# Patient Record
Sex: Female | Born: 1935 | Race: White | Hispanic: No | Smoking: Former smoker
Health system: Southern US, Community
[De-identification: ages and names within clinical notes are randomized; demographics above are authoritative.]

## PROBLEM LIST (undated history)

## (undated) DIAGNOSIS — I10 Essential (primary) hypertension: Secondary | ICD-10-CM

## (undated) DIAGNOSIS — E7800 Pure hypercholesterolemia, unspecified: Secondary | ICD-10-CM

## (undated) DIAGNOSIS — F0390 Unspecified dementia without behavioral disturbance: Secondary | ICD-10-CM

## (undated) DIAGNOSIS — I639 Cerebral infarction, unspecified: Secondary | ICD-10-CM

## (undated) DIAGNOSIS — J449 Chronic obstructive pulmonary disease, unspecified: Secondary | ICD-10-CM

## (undated) DIAGNOSIS — R32 Unspecified urinary incontinence: Secondary | ICD-10-CM

## (undated) HISTORY — PX: NO PAST SURGERIES: SHX2092

---

## 2006-09-19 ENCOUNTER — Emergency Department (HOSPITAL_COMMUNITY): Admission: EM | Admit: 2006-09-19 | Discharge: 2006-09-19 | Payer: Self-pay | Admitting: Emergency Medicine

## 2006-10-06 ENCOUNTER — Ambulatory Visit (HOSPITAL_COMMUNITY): Admission: RE | Admit: 2006-10-06 | Discharge: 2006-10-06 | Payer: Self-pay | Admitting: Pulmonary Disease

## 2008-10-14 ENCOUNTER — Ambulatory Visit (HOSPITAL_COMMUNITY): Admission: RE | Admit: 2008-10-14 | Discharge: 2008-10-14 | Payer: Self-pay | Admitting: Pulmonary Disease

## 2008-10-24 ENCOUNTER — Ambulatory Visit (HOSPITAL_COMMUNITY): Admission: RE | Admit: 2008-10-24 | Discharge: 2008-10-24 | Payer: Self-pay | Admitting: Pulmonary Disease

## 2009-12-07 ENCOUNTER — Ambulatory Visit (HOSPITAL_COMMUNITY): Admission: RE | Admit: 2009-12-07 | Discharge: 2009-12-07 | Payer: Self-pay | Admitting: Pulmonary Disease

## 2011-01-11 NOTE — Procedures (Signed)
NAME:  Brittany Powers, Brittany Powers              ACCOUNT NO.:  [id number]   MEDICAL RECORD NO.:  [id number]          PATIENT TYPE:  OUT   LOCATION:  RAD                           FACILITY:  [facility]   PHYSICIAN:  Dani Gobble, MD       DATE OF BIRTH:  November 28, 1930   DATE OF PROCEDURE:  10/06/2006  DATE OF DISCHARGE:                                ECHOCARDIOGRAM   REFERRING PHYSICIAN:  Edward L. Juanetta Gosling, M.D.   Technical quality of the study is somewhat limited secondary to patient  body habitus.   This is a 77 year old female with past medical history of hypertension  with TIAs who is referred for evaluation of LV function.   The aorta measures normally at 2.9 cm.   The left atrium also measures normally at 3.3 cm.  The patient appeared  to be in sinus rhythm during the procedure.   The interventricular septum and posterior wall are mildly thickened,  measured at 1.5 cm and 1.4 cm, respectively.  Additionally she has mild  basal septal hypertrophy overlay as well.  No suggestion of LVOT  obstruction on color-flow Doppler.   The aortic valve is trileaflet and mildly thickened and calcified,  particularly on the noncoronary cusp.  There does not appear to be  significant limitation to leaflet excursion.  No aortic insufficiency is  noted.  Doppler interrogation of the aortic valve reveals a peak  velocity of 1.5 meters per second corresponding to a peak gradient of 9  mmHg and a mean gradient of 6 mmHg.   The mitral valve also appears mildly thickened but without limitation to  leaflet excursion.  Mild mitral annular calcification is noted.  Mild  mitral regurgitation is noted.  Doppler interrogation of mitral valve is  within normal limits.   The pulmonic valve was not well visualized.   Tricuspid valve appears grossly structurally normal with trivial  tricuspid regurgitation noted.   The left ventricle is normal in size with LVIDD measured at 3.6 cm, LVIC  measured 2.5 cm.  Overall left  systolic function is normal and no  regional wall motion abnormalities are noted.  The inflow signal does  suggest diastolic dysfunction.   The right atrium and right ventricle are normal in size and right  ventricular systolic function appears to be normal.  There does appear  to be mild right ventricular hypertrophy.   IMPRESSION:  1. Mild concentric left ventricular hypertrophy with additional basal      septal hypertrophy overlay with out left ventricular outflow tract      obstruction.  2. Mild aortic sclerosis without hemodynamically significant stenosis.  3. Mild mitral and trivial tricuspid regurgitation.  4. Mild mitral annular calcification.  5. Normal left ventricular size and systolic function without regional      wall motion abnormality noted.  6. Presence of diastolic dysfunction is inferred from the inflow      signal.  7. Mild right ventricular hypertrophy  8. No obvious embolic source.  However, the possibility cannot be      excluded on this study.  Consider transesophageal echocardiogram if  clinically indicated.           ______________________________  Dani Gobble, MD     AB/MEDQ  D:  10/06/2006  T:  10/07/2006  Job:  [id number]

## 2012-05-11 ENCOUNTER — Ambulatory Visit (HOSPITAL_COMMUNITY)
Admission: RE | Admit: 2012-05-11 | Discharge: 2012-05-11 | Disposition: A | Discharge status: Home / Self Care | Payer: Medicare Other | Source: Ambulatory Visit | Attending: Pulmonary Disease | Admitting: Pulmonary Disease

## 2012-05-11 ENCOUNTER — Other Ambulatory Visit (HOSPITAL_COMMUNITY): Payer: Self-pay | Admitting: Pulmonary Disease

## 2012-05-11 DIAGNOSIS — R634 Abnormal weight loss: Secondary | ICD-10-CM

## 2012-05-11 DIAGNOSIS — J45909 Unspecified asthma, uncomplicated: Secondary | ICD-10-CM | POA: Insufficient documentation

## 2012-10-06 ENCOUNTER — Emergency Department (HOSPITAL_COMMUNITY)
Admission: EM | Admit: 2012-10-06 | Discharge: 2012-10-06 | Disposition: A | Discharge status: Home / Self Care | Payer: Medicare Other | Attending: Emergency Medicine | Admitting: Emergency Medicine

## 2012-10-06 ENCOUNTER — Encounter (HOSPITAL_COMMUNITY): Payer: Self-pay | Admitting: *Deleted

## 2012-10-06 ENCOUNTER — Emergency Department (HOSPITAL_COMMUNITY): Payer: Medicare Other

## 2012-10-06 DIAGNOSIS — S40029A Contusion of unspecified upper arm, initial encounter: Secondary | ICD-10-CM | POA: Insufficient documentation

## 2012-10-06 DIAGNOSIS — S0010XA Contusion of unspecified eyelid and periocular area, initial encounter: Secondary | ICD-10-CM | POA: Insufficient documentation

## 2012-10-06 DIAGNOSIS — S0180XA Unspecified open wound of other part of head, initial encounter: Secondary | ICD-10-CM | POA: Insufficient documentation

## 2012-10-06 DIAGNOSIS — Y929 Unspecified place or not applicable: Secondary | ICD-10-CM | POA: Insufficient documentation

## 2012-10-06 DIAGNOSIS — Y939 Activity, unspecified: Secondary | ICD-10-CM | POA: Insufficient documentation

## 2012-10-06 DIAGNOSIS — Z7982 Long term (current) use of aspirin: Secondary | ICD-10-CM | POA: Insufficient documentation

## 2012-10-06 DIAGNOSIS — I10 Essential (primary) hypertension: Secondary | ICD-10-CM | POA: Insufficient documentation

## 2012-10-06 DIAGNOSIS — Z8673 Personal history of transient ischemic attack (TIA), and cerebral infarction without residual deficits: Secondary | ICD-10-CM | POA: Insufficient documentation

## 2012-10-06 DIAGNOSIS — W19XXXA Unspecified fall, initial encounter: Secondary | ICD-10-CM | POA: Insufficient documentation

## 2012-10-06 DIAGNOSIS — F17200 Nicotine dependence, unspecified, uncomplicated: Secondary | ICD-10-CM | POA: Insufficient documentation

## 2012-10-06 DIAGNOSIS — S0181XA Laceration without foreign body of other part of head, initial encounter: Secondary | ICD-10-CM

## 2012-10-06 DIAGNOSIS — E7800 Pure hypercholesterolemia, unspecified: Secondary | ICD-10-CM | POA: Insufficient documentation

## 2012-10-06 DIAGNOSIS — Z7902 Long term (current) use of antithrombotics/antiplatelets: Secondary | ICD-10-CM | POA: Insufficient documentation

## 2012-10-06 DIAGNOSIS — S0512XA Contusion of eyeball and orbital tissues, left eye, initial encounter: Secondary | ICD-10-CM

## 2012-10-06 HISTORY — DX: Cerebral infarction, unspecified: I63.9

## 2012-10-06 HISTORY — DX: Pure hypercholesterolemia, unspecified: E78.00

## 2012-10-06 HISTORY — DX: Essential (primary) hypertension: I10

## 2012-10-06 NOTE — ED Notes (Signed)
Physician in room with pt. 

## 2012-10-06 NOTE — ED Notes (Signed)
Pt brought to er by family with c/o fall that occurred this past Sunday, states that she has a hard time walking due to 3 previous strokes. Pt states that she was talking to Dr. Juanetta Gosling today who advised pt to come to er to have xrays done. Speech normal per pt's brother, pt c/o pain to left elbow, right hand, left eye area. Pt states that she fell outside and hit the pavement with the left side of her face. Denies any loc.

## 2012-10-06 NOTE — ED Provider Notes (Signed)
History     CSN: [id number]  Arrival date & time 10/06/12  1545   First MD Initiated Contact with Patient 10/06/12 1601      Chief Complaint  Patient presents with  . Fall    (Consider location/radiation/quality/duration/timing/severity/associated sxs/prior treatment) Patient is a 79 y.o. female presenting with fall.  Fall Pertinent negatives include no numbness, no abdominal pain, no nausea, no vomiting and no headaches.   patient had a fall a couple days ago. She was reaching for a cat and fell. No loss of consciousness she's had swelling in her face since. She saw her primary care Dr. was told to come in to the ER. She's not unsteady. She's had her baseline mental status. No confusion. No slurred speech. She also had some bruising to her arms but no pain. She is not on anticoagulation per the patient.  Past Medical History  Diagnosis Date  . CVA (cerebral infarction)   . Hypertension   . High cholesterol     History reviewed. No pertinent past surgical history.  No family history on file.  History  Substance Use Topics  . Smoking status: Current Every Day Smoker  . Smokeless tobacco: Not on file  . Alcohol Use: No    OB History   Grav Para Term Preterm Abortions TAB SAB Ect Mult Living                  Review of Systems  Constitutional: Negative for activity change and appetite change.  HENT: Negative for neck stiffness.   Eyes: Negative for pain.  Respiratory: Negative for chest tightness and shortness of breath.   Cardiovascular: Negative for chest pain and leg swelling.  Gastrointestinal: Negative for nausea, vomiting, abdominal pain and diarrhea.  Genitourinary: Negative for flank pain.  Musculoskeletal: Negative for back pain.  Skin: Negative for rash.  Neurological: Negative for weakness, numbness and headaches.  Psychiatric/Behavioral: Negative for behavioral problems.    Allergies  Bee venom  Home Medications   Current Outpatient Rx  Name   Route  Sig  Dispense  Refill  . aspirin EC 81 MG tablet   Oral   Take 81 mg by mouth every evening.         . Calcium Carbonate-Vitamin D (CALCIUM 500 + D PO)   Oral   Take 1 tablet by mouth 2 (two) times daily.         . clopidogrel (PLAVIX) 75 MG tablet   Oral   Take 75 mg by mouth every morning.         . fish oil-omega-3 fatty acids 1000 MG capsule   Oral   Take 1 g by mouth 2 (two) times daily.         . hydrochlorothiazide (HYDRODIURIL) 25 MG tablet   Oral   Take 12.5 mg by mouth every morning.         . menthol-thymol (ABSORBINE JR) LIQD   Topical   Apply 1 application topically daily as needed (*Applied to right wrist as needed for pain).         . NIACIN PO   Oral   Take 1,000 mg by mouth every evening.         . verapamil (CALAN) 80 MG tablet   Oral   Take 80 mg by mouth 3 (three) times daily. Takes last dose two hours prior to evening meal           BP 138/70  Pulse 71  Temp(Src) 97.9  F (36.6 C) (Oral)  Resp 18  Ht 5' (1.524 m)  Wt 112 lb (50.803 kg)  BMI 21.87 kg/m2  SpO2 98%  Physical Exam  Constitutional: She is oriented to person, place, and time. She appears well-developed and well-nourished.  HENT:  Head: Normocephalic.  Ecchymosis to left periorbital area. There is swelling. Minimal drainage medially. Extraocular movements intact. No hyphema. Neck is nontender. Range of motion intact. Face is stable.  Eyes: Conjunctivae are normal. Pupils are equal, round, and reactive to light.  Neck: Neck supple.  Cardiovascular: Normal rate and regular rhythm.   Pulmonary/Chest: Effort normal.  Abdominal: Soft.  Musculoskeletal: Normal range of motion.  Elbow area. Range of motion intact. Nontender. Neuro intact distally.  Neurological: She is alert and oriented to person, place, and time.  Skin: Skin is warm.   healing 1.5 cm laceration above left eye.  ED Course  Procedures (including critical care time)  Labs Reviewed - No data  to display Ct Head Wo Contrast  10/06/2012  *RADIOLOGY REPORT*  Clinical Data:  Fall.  Left periorbital soft tissue swelling. History of CVA and hypertension.  CT HEAD WITHOUT CONTRAST CT MAXILLOFACIAL WITHOUT CONTRAST CT CERVICAL SPINE WITHOUT CONTRAST  Technique:  Multidetector CT imaging of the head, cervical spine, and maxillofacial structures were performed using the standard protocol without intravenous contrast. Multiplanar CT image reconstructions of the cervical spine and maxillofacial structures were also generated.  Comparison:  10/14/2008  CT HEAD  Findings:  Stable lacunar infarcts in the basal ganglia and right external capsule. Periventricular and corona radiata white matter hypodensities are most compatible with chronic ischemic microvascular white matter disease.  The brain stem, cerebellum, thalami, ventricular system, and basilar cisterns appear unremarkable.  No intracranial hemorrhage, mass lesion, or acute infarction is identified.  Left periorbital soft tissue swelling noted.  Calcified lesion in the left frontal sinus probably a large osteoma and appears similar to priors.  Atherosclerotic calcification of the carotid siphons noted. Suspected small stable calcified meningioma along the right anterior falx, image 48 of series 3.  IMPRESSION:  1.  No acute intracranial findings. 2.  Left periorbital soft tissue swelling. 3.  Suspected small meningioma along the right anterior falx. 4. Periventricular and corona radiata white matter hypodensities are most compatible with chronic ischemic microvascular white matter disease. 5.  Stable small old lacunar infarcts in the basal ganglia and right external capsule. 6.  Stable calcified lesion in the left frontal sinus, probably an osteoma.  CT MAXILLOFACIAL  Findings:  Left frontal ossified lesion is probably an osteoma. Polypoid mucoperiosteal thickening noted inferiorly in the left maxillary sinus.  Remaining paranasal sinuses intact.  Left  periorbital soft tissue swelling noted without underlying fracture.  No acute facial fracture is observed.  No postseptal edema in the left orbit.  The globes appear symmetric.  IMPRESSION:  1.  Left periorbital soft tissue swelling without underlying facial fracture. 2.  Mild chronic left maxillary sinusitis. 3.  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  CT CERVICAL SPINE  Findings:   Degenerative findings at the C1-2 anterior articulation noted with spurring and a small subcortical cyst in the odontoid.  Multilevel degenerative facet arthropathy observed.  There is 1.5 mm degenerative anterior subluxation at C4-5 and 2 mm degenerative anterior subluxation of C5-6.  Loss of disc height noted at C6-7 with posterior osseous ridging.  No prevertebral soft tissue swelling noted.  No fracture or acute subluxation identified.  Mildly heterogeneous thyroid gland noted with small nodules.  IMPRESSION:  1.  Cervical spondylosis and degenerative disc disease, without fracture or acute subluxation observed. 2.  Small thyroid nodules - this is been previously worked up by thyroid ultrasound, revealing multinodular goiter.   Original Report Authenticated By: Gaylyn Rong, M.D.    Ct Cervical Spine Wo Contrast  10/06/2012  *RADIOLOGY REPORT*  Clinical Data:  Fall.  Left periorbital soft tissue swelling. History of CVA and hypertension.  CT HEAD WITHOUT CONTRAST CT MAXILLOFACIAL WITHOUT CONTRAST CT CERVICAL SPINE WITHOUT CONTRAST  Technique:  Multidetector CT imaging of the head, cervical spine, and maxillofacial structures were performed using the standard protocol without intravenous contrast. Multiplanar CT image reconstructions of the cervical spine and maxillofacial structures were also generated.  Comparison:  10/14/2008  CT HEAD  Findings:  Stable lacunar infarcts in the basal ganglia and right external capsule. Periventricular and corona radiata white matter hypodensities are most compatible with chronic  ischemic microvascular white matter disease.  The brain stem, cerebellum, thalami, ventricular system, and basilar cisterns appear unremarkable.  No intracranial hemorrhage, mass lesion, or acute infarction is identified.  Left periorbital soft tissue swelling noted.  Calcified lesion in the left frontal sinus probably a large osteoma and appears similar to priors.  Atherosclerotic calcification of the carotid siphons noted. Suspected small stable calcified meningioma along the right anterior falx, image 48 of series 3.  IMPRESSION:  1.  No acute intracranial findings. 2.  Left periorbital soft tissue swelling. 3.  Suspected small meningioma along the right anterior falx. 4. Periventricular and corona radiata white matter hypodensities are most compatible with chronic ischemic microvascular white matter disease. 5.  Stable small old lacunar infarcts in the basal ganglia and right external capsule. 6.  Stable calcified lesion in the left frontal sinus, probably an osteoma.  CT MAXILLOFACIAL  Findings:  Left frontal ossified lesion is probably an osteoma. Polypoid mucoperiosteal thickening noted inferiorly in the left maxillary sinus.  Remaining paranasal sinuses intact.  Left periorbital soft tissue swelling noted without underlying fracture.  No acute facial fracture is observed.  No postseptal edema in the left orbit.  The globes appear symmetric.  IMPRESSION:  1.  Left periorbital soft tissue swelling without underlying facial fracture. 2.  Mild chronic left maxillary sinusitis. 3.  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  CT CERVICAL SPINE  Findings:   Degenerative findings at the C1-2 anterior articulation noted with spurring and a small subcortical cyst in the odontoid.  Multilevel degenerative facet arthropathy observed.  There is 1.5 mm degenerative anterior subluxation at C4-5 and 2 mm degenerative anterior subluxation of C5-6.  Loss of disc height noted at C6-7 with posterior osseous ridging.  No  prevertebral soft tissue swelling noted.  No fracture or acute subluxation identified.  Mildly heterogeneous thyroid gland noted with small nodules.  IMPRESSION:  1.  Cervical spondylosis and degenerative disc disease, without fracture or acute subluxation observed. 2.  Small thyroid nodules - this is been previously worked up by thyroid ultrasound, revealing multinodular goiter.   Original Report Authenticated By: Gaylyn Rong, M.D.    Ct Maxillofacial Wo Cm  10/06/2012  *RADIOLOGY REPORT*  Clinical Data:  Fall.  Left periorbital soft tissue swelling. History of CVA and hypertension.  CT HEAD WITHOUT CONTRAST CT MAXILLOFACIAL WITHOUT CONTRAST CT CERVICAL SPINE WITHOUT CONTRAST  Technique:  Multidetector CT imaging of the head, cervical spine, and maxillofacial structures were performed using the standard protocol without intravenous contrast. Multiplanar CT image reconstructions of the cervical spine and maxillofacial structures  were also generated.  Comparison:  10/14/2008  CT HEAD  Findings:  Stable lacunar infarcts in the basal ganglia and right external capsule. Periventricular and corona radiata white matter hypodensities are most compatible with chronic ischemic microvascular white matter disease.  The brain stem, cerebellum, thalami, ventricular system, and basilar cisterns appear unremarkable.  No intracranial hemorrhage, mass lesion, or acute infarction is identified.  Left periorbital soft tissue swelling noted.  Calcified lesion in the left frontal sinus probably a large osteoma and appears similar to priors.  Atherosclerotic calcification of the carotid siphons noted. Suspected small stable calcified meningioma along the right anterior falx, image 48 of series 3.  IMPRESSION:  1.  No acute intracranial findings. 2.  Left periorbital soft tissue swelling. 3.  Suspected small meningioma along the right anterior falx. 4. Periventricular and corona radiata white matter hypodensities are most  compatible with chronic ischemic microvascular white matter disease. 5.  Stable small old lacunar infarcts in the basal ganglia and right external capsule. 6.  Stable calcified lesion in the left frontal sinus, probably an osteoma.  CT MAXILLOFACIAL  Findings:  Left frontal ossified lesion is probably an osteoma. Polypoid mucoperiosteal thickening noted inferiorly in the left maxillary sinus.  Remaining paranasal sinuses intact.  Left periorbital soft tissue swelling noted without underlying fracture.  No acute facial fracture is observed.  No postseptal edema in the left orbit.  The globes appear symmetric.  IMPRESSION:  1.  Left periorbital soft tissue swelling without underlying facial fracture. 2.  Mild chronic left maxillary sinusitis. 3.  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  CT CERVICAL SPINE  Findings:   Degenerative findings at the C1-2 anterior articulation noted with spurring and a small subcortical cyst in the odontoid.  Multilevel degenerative facet arthropathy observed.  There is 1.5 mm degenerative anterior subluxation at C4-5 and 2 mm degenerative anterior subluxation of C5-6.  Loss of disc height noted at C6-7 with posterior osseous ridging.  No prevertebral soft tissue swelling noted.  No fracture or acute subluxation identified.  Mildly heterogeneous thyroid gland noted with small nodules.  IMPRESSION:  1.  Cervical spondylosis and degenerative disc disease, without fracture or acute subluxation observed. 2.  Small thyroid nodules - this is been previously worked up by thyroid ultrasound, revealing multinodular goiter.   Original Report Authenticated By: Gaylyn Rong, M.D.      1. Fall   2. Periorbital contusion of left eye   3. Facial laceration       MDM  Patient with a fall. Negative CTs. No visual changes. Patient will be discharged home        Juliet Rude. Rubin Payor, MD 10/06/12 2136

## 2013-04-07 ENCOUNTER — Other Ambulatory Visit (HOSPITAL_COMMUNITY): Payer: Self-pay | Admitting: Pulmonary Disease

## 2013-04-07 DIAGNOSIS — R531 Weakness: Secondary | ICD-10-CM

## 2013-04-12 ENCOUNTER — Ambulatory Visit (HOSPITAL_COMMUNITY)
Admission: RE | Admit: 2013-04-12 | Discharge: 2013-04-12 | Disposition: A | Discharge status: Home / Self Care | Payer: Medicare Other | Source: Ambulatory Visit | Attending: Pulmonary Disease | Admitting: Pulmonary Disease

## 2013-04-12 DIAGNOSIS — E042 Nontoxic multinodular goiter: Secondary | ICD-10-CM | POA: Insufficient documentation

## 2013-04-12 DIAGNOSIS — R531 Weakness: Secondary | ICD-10-CM

## 2013-04-12 DIAGNOSIS — R5381 Other malaise: Secondary | ICD-10-CM | POA: Insufficient documentation

## 2013-04-12 DIAGNOSIS — R946 Abnormal results of thyroid function studies: Secondary | ICD-10-CM | POA: Insufficient documentation

## 2013-04-16 ENCOUNTER — Other Ambulatory Visit (HOSPITAL_COMMUNITY): Payer: Self-pay | Admitting: Pulmonary Disease

## 2013-04-16 DIAGNOSIS — E042 Nontoxic multinodular goiter: Secondary | ICD-10-CM

## 2013-04-22 ENCOUNTER — Ambulatory Visit (HOSPITAL_COMMUNITY)
Admission: RE | Admit: 2013-04-22 | Discharge: 2013-04-22 | Disposition: A | Discharge status: Home / Self Care | Payer: Medicare Other | Source: Ambulatory Visit | Attending: Pulmonary Disease | Admitting: Pulmonary Disease

## 2013-04-22 ENCOUNTER — Encounter (HOSPITAL_COMMUNITY): Payer: Self-pay

## 2013-04-22 VITALS — BP 153/73 | HR 70 | Temp 98.7°F | Resp 16

## 2013-04-22 DIAGNOSIS — E042 Nontoxic multinodular goiter: Secondary | ICD-10-CM

## 2013-04-22 MED ORDER — LIDOCAINE HCL (PF) 2 % IJ SOLN
INTRAMUSCULAR | Status: AC
  Filled 2013-04-22: qty 10

## 2013-04-30 ENCOUNTER — Other Ambulatory Visit (HOSPITAL_COMMUNITY): Payer: Self-pay | Admitting: Pulmonary Disease

## 2013-04-30 ENCOUNTER — Ambulatory Visit (HOSPITAL_COMMUNITY)
Admission: RE | Admit: 2013-04-30 | Discharge: 2013-04-30 | Disposition: A | Discharge status: Home / Self Care | Payer: Medicare Other | Source: Ambulatory Visit | Attending: Pulmonary Disease | Admitting: Pulmonary Disease

## 2013-04-30 DIAGNOSIS — E042 Nontoxic multinodular goiter: Secondary | ICD-10-CM

## 2013-04-30 MED ORDER — LIDOCAINE HCL (PF) 2 % IJ SOLN
INTRAMUSCULAR | Status: AC
  Administered 2013-04-30: 10 mL
  Filled 2013-04-30: qty 10

## 2013-04-30 MED ORDER — LIDOCAINE HCL (PF) 2 % IJ SOLN
10.0000 mL | Freq: Once | INTRAMUSCULAR | Status: AC
  Administered 2013-04-30: 10 mL
  Filled 2013-04-30: qty 10

## 2013-04-30 NOTE — Progress Notes (Signed)
Pt thyroid biopsy complete no signs of distress.

## 2013-07-09 ENCOUNTER — Other Ambulatory Visit (HOSPITAL_COMMUNITY): Payer: Self-pay | Admitting: Pulmonary Disease

## 2013-07-09 DIAGNOSIS — I6782 Cerebral ischemia: Secondary | ICD-10-CM

## 2013-07-13 ENCOUNTER — Other Ambulatory Visit (HOSPITAL_COMMUNITY): Payer: Medicare Other

## 2013-07-20 ENCOUNTER — Ambulatory Visit (HOSPITAL_COMMUNITY)
Admission: RE | Admit: 2013-07-20 | Discharge: 2013-07-20 | Disposition: A | Discharge status: Home / Self Care | Payer: Medicare Other | Source: Ambulatory Visit | Attending: Pulmonary Disease | Admitting: Pulmonary Disease

## 2013-07-20 DIAGNOSIS — I6782 Cerebral ischemia: Secondary | ICD-10-CM

## 2013-07-20 DIAGNOSIS — I658 Occlusion and stenosis of other precerebral arteries: Secondary | ICD-10-CM | POA: Insufficient documentation

## 2013-07-20 DIAGNOSIS — I6529 Occlusion and stenosis of unspecified carotid artery: Secondary | ICD-10-CM | POA: Insufficient documentation

## 2013-07-20 DIAGNOSIS — I6789 Other cerebrovascular disease: Secondary | ICD-10-CM | POA: Insufficient documentation

## 2014-03-15 ENCOUNTER — Other Ambulatory Visit (HOSPITAL_COMMUNITY): Payer: Self-pay | Admitting: Pulmonary Disease

## 2014-03-15 DIAGNOSIS — R7989 Other specified abnormal findings of blood chemistry: Secondary | ICD-10-CM

## 2014-03-17 ENCOUNTER — Ambulatory Visit (HOSPITAL_COMMUNITY)
Admission: RE | Admit: 2014-03-17 | Discharge: 2014-03-17 | Disposition: A | Discharge status: Home / Self Care | Payer: Medicare Other | Source: Ambulatory Visit | Attending: Pulmonary Disease | Admitting: Pulmonary Disease

## 2014-03-17 DIAGNOSIS — E042 Nontoxic multinodular goiter: Secondary | ICD-10-CM | POA: Diagnosis not present

## 2014-03-17 DIAGNOSIS — R7989 Other specified abnormal findings of blood chemistry: Secondary | ICD-10-CM | POA: Insufficient documentation

## 2014-04-02 ENCOUNTER — Emergency Department (HOSPITAL_COMMUNITY)
Admission: EM | Admit: 2014-04-02 | Discharge: 2014-04-02 | Disposition: A | Discharge status: Home / Self Care | Payer: Medicare Other | Attending: Emergency Medicine | Admitting: Emergency Medicine

## 2014-04-02 ENCOUNTER — Encounter (HOSPITAL_COMMUNITY): Payer: Self-pay | Admitting: Emergency Medicine

## 2014-04-02 ENCOUNTER — Emergency Department (HOSPITAL_COMMUNITY): Payer: Medicare Other

## 2014-04-02 DIAGNOSIS — S6990XA Unspecified injury of unspecified wrist, hand and finger(s), initial encounter: Secondary | ICD-10-CM

## 2014-04-02 DIAGNOSIS — R5383 Other fatigue: Secondary | ICD-10-CM

## 2014-04-02 DIAGNOSIS — F17200 Nicotine dependence, unspecified, uncomplicated: Secondary | ICD-10-CM | POA: Insufficient documentation

## 2014-04-02 DIAGNOSIS — S0990XA Unspecified injury of head, initial encounter: Secondary | ICD-10-CM | POA: Diagnosis not present

## 2014-04-02 DIAGNOSIS — Y929 Unspecified place or not applicable: Secondary | ICD-10-CM | POA: Insufficient documentation

## 2014-04-02 DIAGNOSIS — Z862 Personal history of diseases of the blood and blood-forming organs and certain disorders involving the immune mechanism: Secondary | ICD-10-CM | POA: Insufficient documentation

## 2014-04-02 DIAGNOSIS — Y939 Activity, unspecified: Secondary | ICD-10-CM | POA: Insufficient documentation

## 2014-04-02 DIAGNOSIS — I10 Essential (primary) hypertension: Secondary | ICD-10-CM | POA: Diagnosis not present

## 2014-04-02 DIAGNOSIS — R5381 Other malaise: Secondary | ICD-10-CM | POA: Insufficient documentation

## 2014-04-02 DIAGNOSIS — Z8639 Personal history of other endocrine, nutritional and metabolic disease: Secondary | ICD-10-CM | POA: Insufficient documentation

## 2014-04-02 DIAGNOSIS — Z7902 Long term (current) use of antithrombotics/antiplatelets: Secondary | ICD-10-CM | POA: Diagnosis not present

## 2014-04-02 DIAGNOSIS — R296 Repeated falls: Secondary | ICD-10-CM | POA: Diagnosis not present

## 2014-04-02 DIAGNOSIS — R531 Weakness: Secondary | ICD-10-CM

## 2014-04-02 DIAGNOSIS — S59909A Unspecified injury of unspecified elbow, initial encounter: Secondary | ICD-10-CM | POA: Diagnosis present

## 2014-04-02 DIAGNOSIS — Z7982 Long term (current) use of aspirin: Secondary | ICD-10-CM | POA: Insufficient documentation

## 2014-04-02 DIAGNOSIS — S59919A Unspecified injury of unspecified forearm, initial encounter: Secondary | ICD-10-CM

## 2014-04-02 DIAGNOSIS — Z79899 Other long term (current) drug therapy: Secondary | ICD-10-CM | POA: Diagnosis not present

## 2014-04-02 DIAGNOSIS — Z9181 History of falling: Secondary | ICD-10-CM | POA: Insufficient documentation

## 2014-04-02 DIAGNOSIS — S5010XA Contusion of unspecified forearm, initial encounter: Secondary | ICD-10-CM | POA: Diagnosis not present

## 2014-04-02 LAB — COMPREHENSIVE METABOLIC PANEL
ALT: 13 U/L (ref 0–35)
AST: 24 U/L (ref 0–37)
Albumin: 3.4 g/dL — ABNORMAL LOW (ref 3.5–5.2)
Alkaline Phosphatase: 56 U/L (ref 39–117)
Anion gap: 11 (ref 5–15)
BUN: 21 mg/dL (ref 6–23)
CO2: 24 meq/L (ref 19–32)
CREATININE: 0.69 mg/dL (ref 0.50–1.10)
Calcium: 9.1 mg/dL (ref 8.4–10.5)
Chloride: 108 mEq/L (ref 96–112)
GFR calc Af Amer: >90 mL/min (ref >90)
GFR, EST NON AFRICAN AMERICAN: 80 mL/min — AB (ref >90)
Glucose, Bld: 99 mg/dL (ref 70–99)
Potassium: 3 mEq/L — ABNORMAL LOW (ref 3.7–5.3)
Sodium: 143 mEq/L (ref 137–147)
Total Bilirubin: 0.4 mg/dL (ref 0.3–1.2)
Total Protein: 6.2 g/dL (ref 6.0–8.3)

## 2014-04-02 LAB — CBC WITH DIFFERENTIAL/PLATELET
Basophils Absolute: 0 10*3/uL (ref 0.0–0.1)
Basophils Relative: 0 % (ref 0–1)
Eosinophils Absolute: 0 10*3/uL (ref 0.0–0.7)
Eosinophils Relative: 0 % (ref 0–5)
HCT: 40.7 % (ref 36.0–46.0)
Hemoglobin: 14.3 g/dL (ref 12.0–15.0)
Lymphocytes Relative: 16 % (ref 12–46)
Lymphs Abs: 1.7 10*3/uL (ref 0.7–4.0)
MCH: 33 pg (ref 26.0–34.0)
MCHC: 35.1 g/dL (ref 30.0–36.0)
MCV: 94 fL (ref 78.0–100.0)
MONO ABS: 0.6 10*3/uL (ref 0.1–1.0)
MONOS PCT: 5 % (ref 3–12)
NEUTROS ABS: 8.4 10*3/uL — AB (ref 1.7–7.7)
Neutrophils Relative %: 79 % — ABNORMAL HIGH (ref 43–77)
Platelets: 159 10*3/uL (ref 150–400)
RBC: 4.33 MIL/uL (ref 3.87–5.11)
RDW: 12.6 % (ref 11.5–15.5)
WBC: 10.8 10*3/uL — AB (ref 4.0–10.5)

## 2014-04-02 LAB — TROPONIN I

## 2014-04-02 MED ORDER — SODIUM CHLORIDE 0.9 % IV BOLUS (SEPSIS)
500.0000 mL | Freq: Once | INTRAVENOUS | Status: AC
  Administered 2014-04-02: 500 mL via INTRAVENOUS

## 2014-04-02 NOTE — ED Notes (Signed)
Pt arrived by EMS from home. Advised pt has been falling more recently. Pt complaining of head hurting from a fall 2 weeks ago. Pt states has fallen 2x today. Hematoma to the right arm. cbg 127 per EMS.

## 2014-04-02 NOTE — ED Notes (Signed)
Pt alert & oriented x4, stable gait. Patient given discharge instructions, paperwork & prescription(s). Patient/ family verbalized understanding. Pt left department in wheelchair w/ no further questions.

## 2014-04-02 NOTE — ED Provider Notes (Signed)
CSN: [id number]     Arrival date & time 04/02/14  1911 History   First MD Initiated Contact with Patient 04/02/14 1919     Chief Complaint  Patient presents with  . Fall     (Consider location/radiation/quality/duration/timing/severity/associated sxs/prior Treatment) HPI Comments: Patient is an 80 year old female with history of hypertension and CVA x4. Her main deficit appears to be an expressive aphasia. She presents today with complaints of recurrent falls that have been occurring over the past week. She states she hit her head last week and it has been hurting. She fell at home twice a day and complains of a bruise to her right forearm. She has a hard time expressing herself do to her expressive aphasia, however she denies pain. She is unable to explain to me exactly what happens when she falls. She apparently lives alone.  Patient is a 80 y.o. female presenting with fall. The history is provided by the patient.  Fall This is a recurrent problem. Episode onset: One week ago. The problem occurs every several days. The problem has been gradually worsening. Pertinent negatives include no chest pain, no abdominal pain, no headaches and no shortness of breath. Nothing aggravates the symptoms. Nothing relieves the symptoms. She has tried nothing for the symptoms. The treatment provided no relief.    Past Medical History  Diagnosis Date  . CVA (cerebral infarction)   . Hypertension   . High cholesterol    History reviewed. No pertinent past surgical history. No family history on file. History  Substance Use Topics  . Smoking status: Current Every Day Smoker  . Smokeless tobacco: Not on file  . Alcohol Use: No   OB History   Grav Para Term Preterm Abortions TAB SAB Ect Mult Living                 Review of Systems  Respiratory: Negative for shortness of breath.   Cardiovascular: Negative for chest pain.  Gastrointestinal: Negative for abdominal pain.  Neurological: Negative for  headaches.  All other systems reviewed and are negative.     Allergies  Bee venom  Home Medications   Prior to Admission medications   Medication Sig Start Date End Date Taking? Authorizing Provider  aspirin EC 81 MG tablet Take 81 mg by mouth every evening.    Historical Provider, MD  Calcium Carbonate-Vitamin D (CALCIUM 500 + D PO) Take 1 tablet by mouth 2 (two) times daily.    Historical Provider, MD  clopidogrel (PLAVIX) 75 MG tablet Take 75 mg by mouth every morning.    Historical Provider, MD  fish oil-omega-3 fatty acids 1000 MG capsule Take 1 g by mouth 2 (two) times daily.    Historical Provider, MD  hydrochlorothiazide (HYDRODIURIL) 25 MG tablet Take 12.5 mg by mouth every morning.    Historical Provider, MD  menthol-thymol (ABSORBINE JR) LIQD Apply 1 application topically daily as needed (*Applied to right wrist as needed for pain).    Historical Provider, MD  NIACIN PO Take 1,000 mg by mouth every evening.    Historical Provider, MD  verapamil (CALAN) 80 MG tablet Take 80 mg by mouth 3 (three) times daily. Takes last dose two hours prior to evening meal    Historical Provider, MD   BP 164/98  Pulse 85  Temp(Src) 98.4 F (36.9 C) (Oral)  Ht 5\' 2"  (1.575 m)  Wt 112 lb (50.803 kg)  BMI 20.48 kg/m2  SpO2 99% Physical Exam  Nursing note and vitals reviewed.  Constitutional: She is oriented to person, place, and time. She appears well-developed and well-nourished. No distress.  HENT:  Head: Normocephalic and atraumatic.  Mouth/Throat: Oropharynx is clear and moist.  Eyes: EOM are normal. Pupils are equal, round, and reactive to light.  Neck: Normal range of motion. Neck supple.  Cardiovascular: Normal rate and regular rhythm.  Exam reveals no gallop and no friction rub.   No murmur heard. Pulmonary/Chest: Effort normal and breath sounds normal. No respiratory distress. She has no wheezes.  Abdominal: Soft. Bowel sounds are normal. She exhibits no distension. There is no  tenderness.  Musculoskeletal: Normal range of motion.  There is a large contusion to the left forearm just below the elbow. There is no other deformity and she is able to flex and extend the elbow with minimal discomfort. Distal pulses are palpable and motor and sensory are intact.  Neurological: She is alert and oriented to person, place, and time. No cranial nerve deficit. She exhibits normal muscle tone. Coordination normal.  She does have an expressive aphasia noted.  Skin: Skin is warm and dry. She is not diaphoretic.    ED Course  Procedures (including critical care time) Labs Review Labs Reviewed  CBC WITH DIFFERENTIAL  COMPREHENSIVE METABOLIC PANEL  TROPONIN I    Imaging Review No results found.   EKG Interpretation   Date/Time:  Saturday April 02 2014 19:16:11 EDT Ventricular Rate:  97 PR Interval:  138 QRS Duration: 93 QT Interval:  405 QTC Calculation: 514 R Axis:   63 Text Interpretation:  Sinus rhythm Ventricular bigeminy Minimal ST  elevation, anterior leads Confirmed by DELOS  MD, Alyce Inscore ([id number]) on  04/02/2014 7:38:21 PM      MDM   Final diagnoses:  None    Patient is an 80 year old female brought for evaluation of frequent falls. She apparently fell twice today, one time bruising her forearm. Her x-rays are unremarkable for fracture and laboratory studies are unremarkable as well. Chest x-ray reveals no acute abnormality as does CT scan.  I have found no explanation for her increased unsteadiness, however nothing appears emergent. I discussed the disposition with the patient and family who is at bedside and we have come to the decision that she will be discharged. They will give her additional assistance for the next few days and return to the emergency department if she develops any new and concerning symptoms.    Geoffery Lyonsouglas Evon Lopezperez, MD 04/03/14 2123

## 2014-04-02 NOTE — ED Notes (Signed)
Pt alert. Per EMS pt speech is normally slurred. Pt leaved alone & has family check on her 2 times a day. All neuro in check

## 2014-04-02 NOTE — Discharge Instructions (Signed)

## 2014-09-26 DIAGNOSIS — J449 Chronic obstructive pulmonary disease, unspecified: Secondary | ICD-10-CM | POA: Diagnosis not present

## 2014-09-26 DIAGNOSIS — G453 Amaurosis fugax: Secondary | ICD-10-CM | POA: Diagnosis not present

## 2014-09-26 DIAGNOSIS — F0390 Unspecified dementia without behavioral disturbance: Secondary | ICD-10-CM | POA: Diagnosis not present

## 2014-09-26 DIAGNOSIS — I10 Essential (primary) hypertension: Secondary | ICD-10-CM | POA: Diagnosis not present

## 2014-09-29 DIAGNOSIS — Z9181 History of falling: Secondary | ICD-10-CM | POA: Diagnosis not present

## 2014-09-29 DIAGNOSIS — F0390 Unspecified dementia without behavioral disturbance: Secondary | ICD-10-CM | POA: Diagnosis not present

## 2014-09-29 DIAGNOSIS — R634 Abnormal weight loss: Secondary | ICD-10-CM | POA: Diagnosis not present

## 2014-09-29 DIAGNOSIS — G459 Transient cerebral ischemic attack, unspecified: Secondary | ICD-10-CM | POA: Diagnosis not present

## 2014-09-29 DIAGNOSIS — I10 Essential (primary) hypertension: Secondary | ICD-10-CM | POA: Diagnosis not present

## 2014-09-29 DIAGNOSIS — J449 Chronic obstructive pulmonary disease, unspecified: Secondary | ICD-10-CM | POA: Diagnosis not present

## 2014-09-29 DIAGNOSIS — Z8673 Personal history of transient ischemic attack (TIA), and cerebral infarction without residual deficits: Secondary | ICD-10-CM | POA: Diagnosis not present

## 2014-09-30 DIAGNOSIS — G459 Transient cerebral ischemic attack, unspecified: Secondary | ICD-10-CM | POA: Diagnosis not present

## 2014-09-30 DIAGNOSIS — I10 Essential (primary) hypertension: Secondary | ICD-10-CM | POA: Diagnosis not present

## 2014-09-30 DIAGNOSIS — Z9181 History of falling: Secondary | ICD-10-CM | POA: Diagnosis not present

## 2014-09-30 DIAGNOSIS — R634 Abnormal weight loss: Secondary | ICD-10-CM | POA: Diagnosis not present

## 2014-09-30 DIAGNOSIS — Z8673 Personal history of transient ischemic attack (TIA), and cerebral infarction without residual deficits: Secondary | ICD-10-CM | POA: Diagnosis not present

## 2014-09-30 DIAGNOSIS — F0390 Unspecified dementia without behavioral disturbance: Secondary | ICD-10-CM | POA: Diagnosis not present

## 2014-09-30 DIAGNOSIS — J449 Chronic obstructive pulmonary disease, unspecified: Secondary | ICD-10-CM | POA: Diagnosis not present

## 2014-10-03 DIAGNOSIS — J449 Chronic obstructive pulmonary disease, unspecified: Secondary | ICD-10-CM | POA: Diagnosis not present

## 2014-10-03 DIAGNOSIS — F0390 Unspecified dementia without behavioral disturbance: Secondary | ICD-10-CM | POA: Diagnosis not present

## 2014-10-03 DIAGNOSIS — Z9181 History of falling: Secondary | ICD-10-CM | POA: Diagnosis not present

## 2014-10-03 DIAGNOSIS — I10 Essential (primary) hypertension: Secondary | ICD-10-CM | POA: Diagnosis not present

## 2014-10-03 DIAGNOSIS — R634 Abnormal weight loss: Secondary | ICD-10-CM | POA: Diagnosis not present

## 2014-10-03 DIAGNOSIS — G459 Transient cerebral ischemic attack, unspecified: Secondary | ICD-10-CM | POA: Diagnosis not present

## 2014-10-03 DIAGNOSIS — Z8673 Personal history of transient ischemic attack (TIA), and cerebral infarction without residual deficits: Secondary | ICD-10-CM | POA: Diagnosis not present

## 2014-10-07 DIAGNOSIS — I10 Essential (primary) hypertension: Secondary | ICD-10-CM | POA: Diagnosis not present

## 2014-10-07 DIAGNOSIS — Z9181 History of falling: Secondary | ICD-10-CM | POA: Diagnosis not present

## 2014-10-07 DIAGNOSIS — F0390 Unspecified dementia without behavioral disturbance: Secondary | ICD-10-CM | POA: Diagnosis not present

## 2014-10-07 DIAGNOSIS — J449 Chronic obstructive pulmonary disease, unspecified: Secondary | ICD-10-CM | POA: Diagnosis not present

## 2014-10-07 DIAGNOSIS — Z8673 Personal history of transient ischemic attack (TIA), and cerebral infarction without residual deficits: Secondary | ICD-10-CM | POA: Diagnosis not present

## 2014-10-07 DIAGNOSIS — G459 Transient cerebral ischemic attack, unspecified: Secondary | ICD-10-CM | POA: Diagnosis not present

## 2014-10-07 DIAGNOSIS — R634 Abnormal weight loss: Secondary | ICD-10-CM | POA: Diagnosis not present

## 2014-10-11 DIAGNOSIS — F0390 Unspecified dementia without behavioral disturbance: Secondary | ICD-10-CM | POA: Diagnosis not present

## 2014-10-11 DIAGNOSIS — Z9181 History of falling: Secondary | ICD-10-CM | POA: Diagnosis not present

## 2014-10-11 DIAGNOSIS — R634 Abnormal weight loss: Secondary | ICD-10-CM | POA: Diagnosis not present

## 2014-10-11 DIAGNOSIS — G459 Transient cerebral ischemic attack, unspecified: Secondary | ICD-10-CM | POA: Diagnosis not present

## 2014-10-11 DIAGNOSIS — I10 Essential (primary) hypertension: Secondary | ICD-10-CM | POA: Diagnosis not present

## 2014-10-11 DIAGNOSIS — Z8673 Personal history of transient ischemic attack (TIA), and cerebral infarction without residual deficits: Secondary | ICD-10-CM | POA: Diagnosis not present

## 2014-10-11 DIAGNOSIS — J449 Chronic obstructive pulmonary disease, unspecified: Secondary | ICD-10-CM | POA: Diagnosis not present

## 2014-10-14 DIAGNOSIS — F0390 Unspecified dementia without behavioral disturbance: Secondary | ICD-10-CM | POA: Diagnosis not present

## 2014-10-14 DIAGNOSIS — Z8673 Personal history of transient ischemic attack (TIA), and cerebral infarction without residual deficits: Secondary | ICD-10-CM | POA: Diagnosis not present

## 2014-10-14 DIAGNOSIS — G459 Transient cerebral ischemic attack, unspecified: Secondary | ICD-10-CM | POA: Diagnosis not present

## 2014-10-14 DIAGNOSIS — J449 Chronic obstructive pulmonary disease, unspecified: Secondary | ICD-10-CM | POA: Diagnosis not present

## 2014-10-14 DIAGNOSIS — I10 Essential (primary) hypertension: Secondary | ICD-10-CM | POA: Diagnosis not present

## 2014-10-14 DIAGNOSIS — R634 Abnormal weight loss: Secondary | ICD-10-CM | POA: Diagnosis not present

## 2014-10-14 DIAGNOSIS — Z9181 History of falling: Secondary | ICD-10-CM | POA: Diagnosis not present

## 2014-12-20 IMAGING — US US SOFT TISSUE HEAD/NECK
1 series · 14 of 25 positions shown · non-contrast
Comparison: CT 10/06/2012 and earlier studies

CLINICAL DATA: Weakness, abnormal TSH.  Small nodules on CT
cervical spine.

THYROID ULTRASOUND
TECHNIQUE: Ultrasound examination of the thyroid gland and adjacent
soft tissues was performed.

[Series 1: us soft tissue head/neck · 0.05mm/px · 14 of 50 slices shown]
[im 1/50]
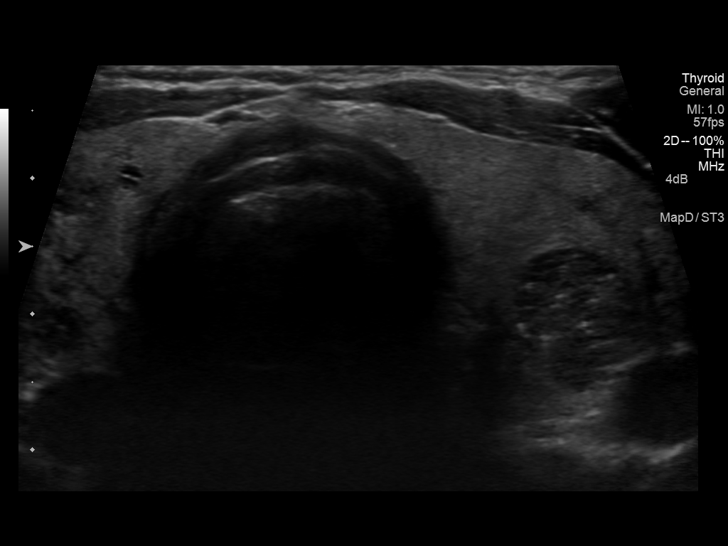
[im 5/50]
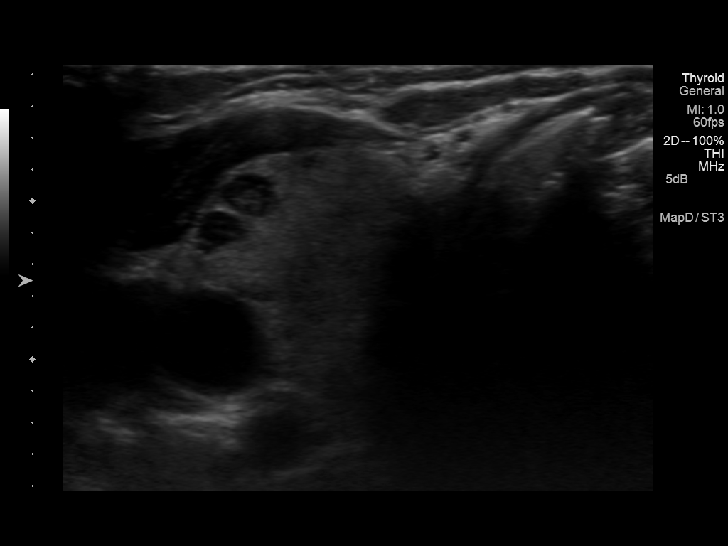
[im 9/50]
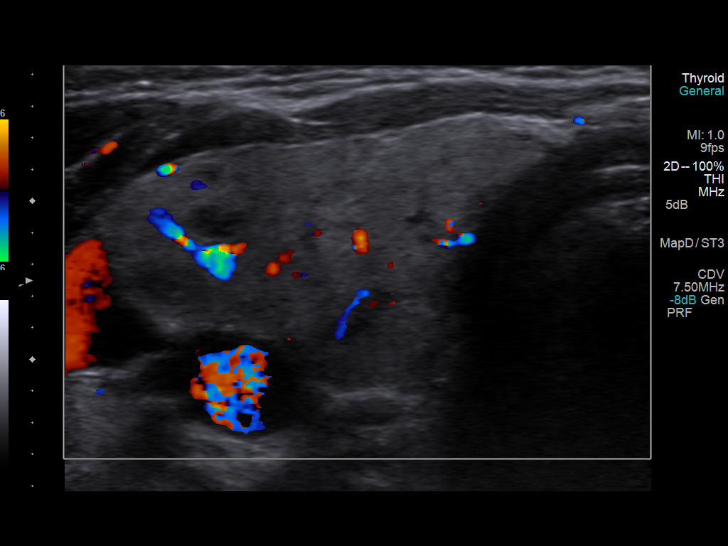
[im 13/50]
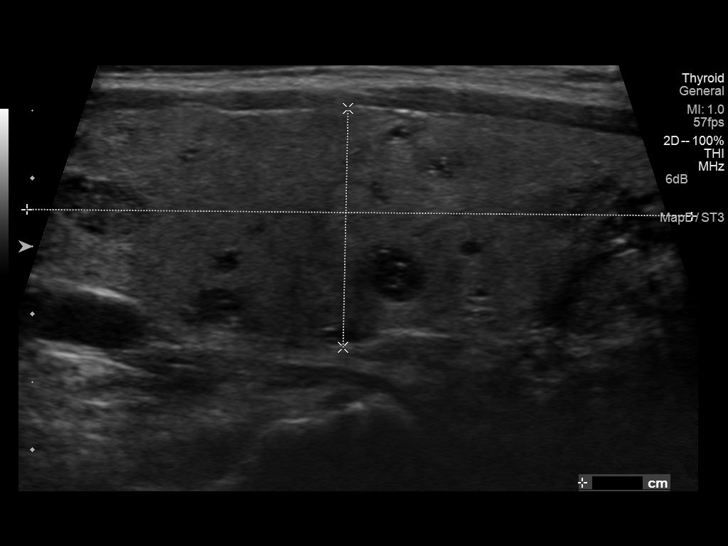
[im 17/50]
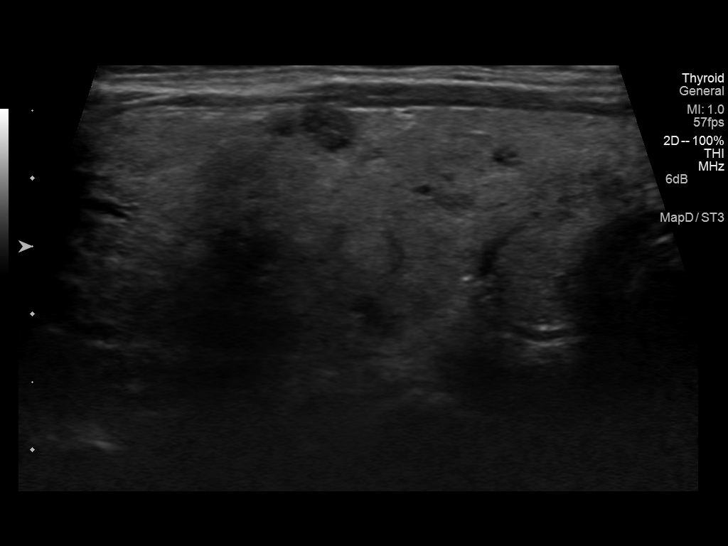
[im 19/50]
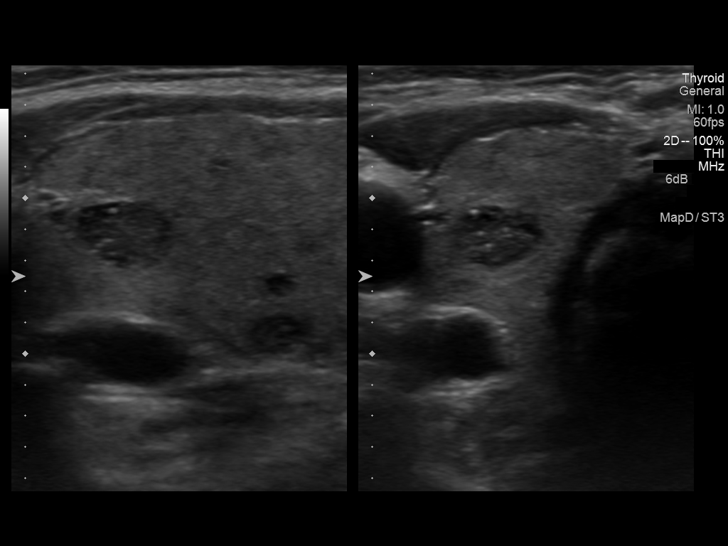
[im 23/50]
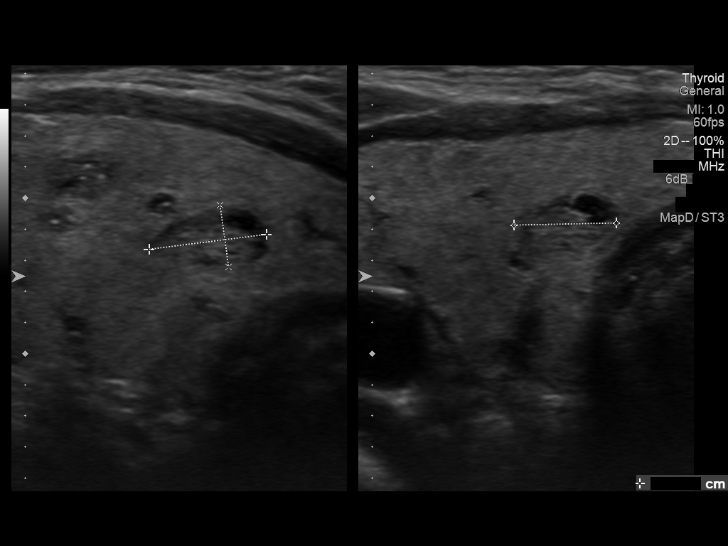
[im 27/50]
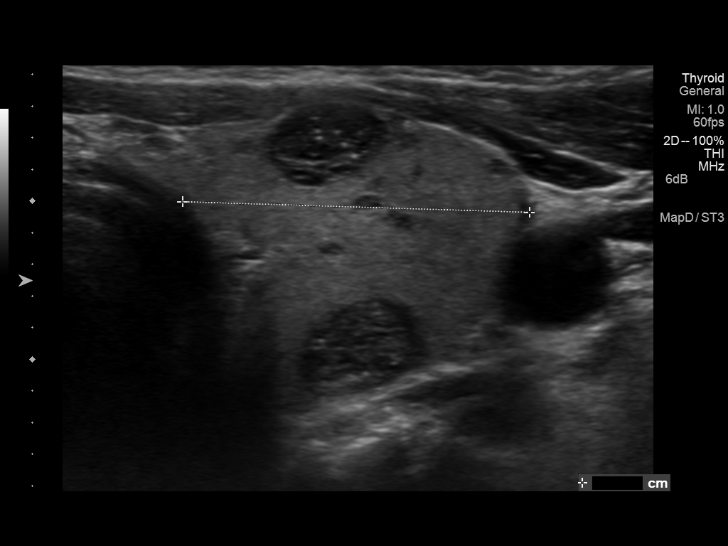
[im 31/50]
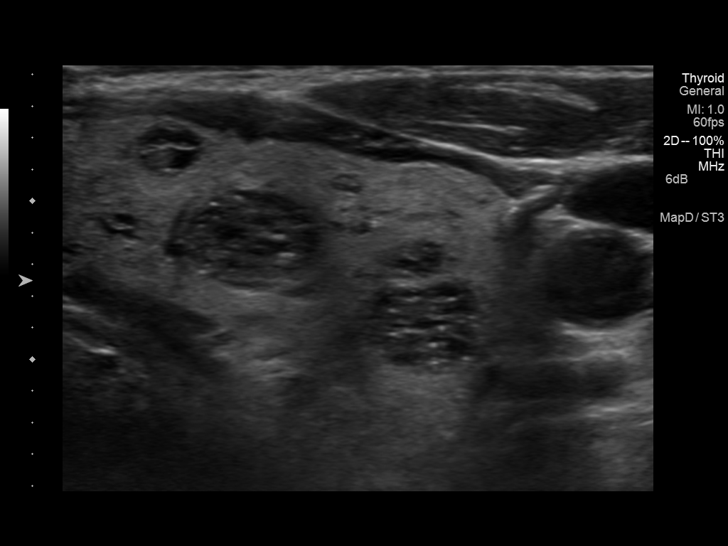
[im 33/50]
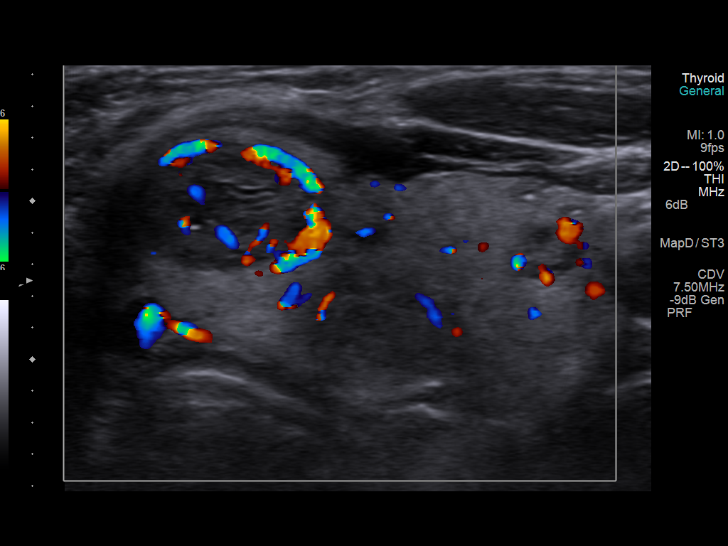
[im 37/50]
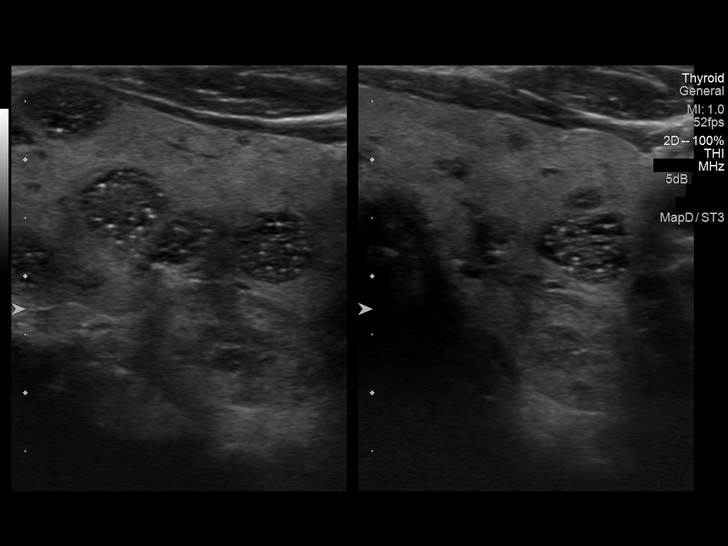
[im 41/50]
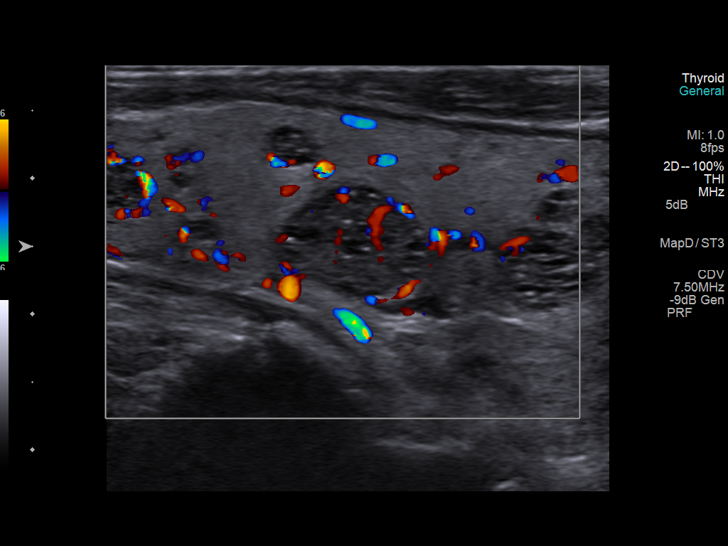
[im 45/50]
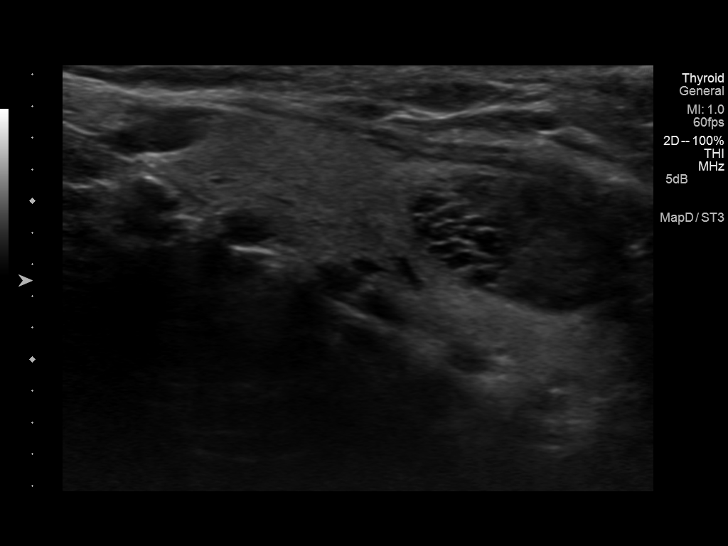
[im 50/50]
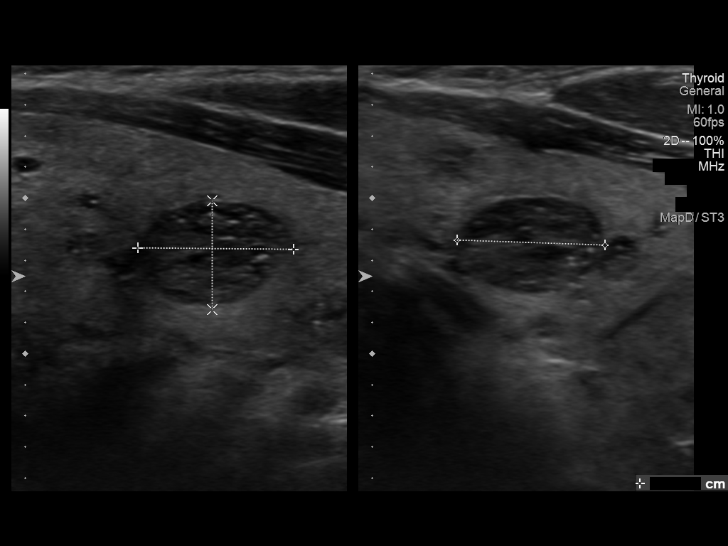

[14 of 25 positions shown; findings below may reference images not displayed]

FINDINGS: Right thyroid lobe:  49 x 18 x 25 mm, homogeneous background
echotexture
Left thyroid lobe:  50 x 25 x 22 mm
Isthmus:  5.2 mm in thickness

Focal nodules:  11 x 11 x 16 mm solid, deep inferior left
9 x 11 x 11 mm hypoechoic solid with microcalcifications, mid-left
8 x 13 x 14 mm complex mostly solid, medial left
There are multiple additional smaller bilateral nodules all less
than 8 mm maximal diameter.

Lymphadenopathy:  None visualized.
IMPRESSION: 1.  Multiple bilateral thyroid nodules as above.  The two dominant
solid left lesions meet consensus criteria for biopsy.  Ultrasound-
guided fine needle aspiration should be considered, as per the
consensus statement: Management of Thyroid Nodules Detected at US:
Society of Radiologists in Ultrasound Consensus Conference

## 2014-12-26 DIAGNOSIS — Z8673 Personal history of transient ischemic attack (TIA), and cerebral infarction without residual deficits: Secondary | ICD-10-CM | POA: Diagnosis not present

## 2014-12-26 DIAGNOSIS — F0390 Unspecified dementia without behavioral disturbance: Secondary | ICD-10-CM | POA: Diagnosis not present

## 2014-12-26 DIAGNOSIS — I69920 Aphasia following unspecified cerebrovascular disease: Secondary | ICD-10-CM | POA: Diagnosis not present

## 2014-12-26 DIAGNOSIS — G459 Transient cerebral ischemic attack, unspecified: Secondary | ICD-10-CM | POA: Diagnosis not present

## 2014-12-26 DIAGNOSIS — J449 Chronic obstructive pulmonary disease, unspecified: Secondary | ICD-10-CM | POA: Diagnosis not present

## 2014-12-26 DIAGNOSIS — I10 Essential (primary) hypertension: Secondary | ICD-10-CM | POA: Diagnosis not present

## 2014-12-26 DIAGNOSIS — E785 Hyperlipidemia, unspecified: Secondary | ICD-10-CM | POA: Diagnosis not present

## 2014-12-27 ENCOUNTER — Non-Acute Institutional Stay (SKILLED_NURSING_FACILITY): Payer: Medicare Other | Admitting: Internal Medicine

## 2014-12-27 ENCOUNTER — Encounter: Payer: Self-pay | Admitting: Internal Medicine

## 2014-12-27 DIAGNOSIS — F0390 Unspecified dementia without behavioral disturbance: Secondary | ICD-10-CM

## 2014-12-27 DIAGNOSIS — E785 Hyperlipidemia, unspecified: Secondary | ICD-10-CM

## 2014-12-27 DIAGNOSIS — I10 Essential (primary) hypertension: Secondary | ICD-10-CM

## 2014-12-27 DIAGNOSIS — J449 Chronic obstructive pulmonary disease, unspecified: Secondary | ICD-10-CM | POA: Diagnosis not present

## 2014-12-27 DIAGNOSIS — Z8673 Personal history of transient ischemic attack (TIA), and cerebral infarction without residual deficits: Secondary | ICD-10-CM | POA: Diagnosis not present

## 2014-12-27 NOTE — Progress Notes (Signed)
Patient ID: Brittany LEEDOM, female   DOB: Jun 29, 1931, 81 y.o.   MRN: [id number]    HISTORY AND PHYSICAL   DATE: 12/27/14  Location:  [facility]    Place of Service: SNF [phone])   Extended Emergency Contact Information Primary Emergency Contact: Beryle [place] Phone: [phone] Relation: Brother  Advanced Directive information  DNR    Chief Complaint  Patient presents with  . New Admit To SNF    HPI:  81 yo female seen today as a new admission into SNF. She is a long term resident. She has dementia, hx CVA, HTN, hyperlipdemia and COPD. She has no c/o. She is a poor historian due to dementia. Hx obtained from chart  She had a stroke and is currently taking plavix   Dementia stable on aricept  BP controlled on HCTZ and verapamil  Cholesterol is diet controlled  She does not require pulm meds and no recent COPD exacerbations   Past Medical History  Diagnosis Date  . CVA (cerebral infarction)   . Hypertension   . High cholesterol     No past surgical history on file.  Patient Care Team: Brittany Du, MD as PCP - General (Internal Medicine)  History   Social History  . Marital Status: Divorced    Spouse Name: N/A  . Number of Children: N/A  . Years of Education: N/A   Occupational History  . Not on file.   Social History Main Topics  . Smoking status: Current Every Day Smoker  . Smokeless tobacco: Not on file  . Alcohol Use: No  . Drug Use: No  . Sexual Activity: Not on file   Other Topics Concern  . Not on file   Social History Narrative     reports that she has been smoking.  She does not have any smokeless tobacco history on file. She reports that she does not drink alcohol or use illicit drugs.  No family history on file. No family status information on file.     There is no immunization history on file for this patient.  Allergies  Allergen Reactions  . Bee Venom Itching and Swelling     Medications: Patient's Medications  New Prescriptions   No medications on file  Previous Medications   ASPIRIN EC 81 MG TABLET    Take 81 mg by mouth every evening.   CALCIUM CARBONATE-VITAMIN D (CALCIUM 500 + D PO)    Take 1 tablet by mouth 2 (two) times daily.   CLOPIDOGREL (PLAVIX) 75 MG TABLET    Take 75 mg by mouth every morning.   DONEPEZIL (ARICEPT) 5 MG TABLET    Take 5 mg by mouth daily.   FISH OIL-OMEGA-3 FATTY ACIDS 1000 MG CAPSULE    Take 1 g by mouth 2 (two) times daily.   HYDROCHLOROTHIAZIDE (HYDRODIURIL) 25 MG TABLET    Take 12.5 mg by mouth every morning.   NAPROXEN SODIUM (ALEVE) 220 MG TABLET    Take 220 mg by mouth 2 (two) times daily.   VERAPAMIL (CALAN) 80 MG TABLET    Take 80 mg by mouth 3 (three) times daily. Takes last dose two hours prior to evening meal  Modified Medications   No medications on file  Discontinued Medications   No medications on file    Review of Systems  Unable to perform ROS: Dementia    Filed Vitals:   12/27/14 1615  BP: 146/68  Pulse: 68  Temp:  97.7 F (36.5 C)  Weight: 116 lb (52.617 kg)   Body mass index is 21.21 kg/(m^2).  Physical Exam  Constitutional: She appears well-developed and well-nourished.  HENT:  Mouth/Throat: Oropharynx is clear and moist. No oropharyngeal exudate.  Eyes: Pupils are equal, round, and reactive to light. No scleral icterus.  Neck: Neck supple. Carotid bruit is not present. No tracheal deviation present. No thyromegaly present.  Cardiovascular: Normal rate, regular rhythm, normal heart sounds and intact distal pulses.  Exam reveals no gallop and no friction rub.   No murmur heard. No LE edema b/l. no calf TTP.   Pulmonary/Chest: Effort normal and breath sounds normal. No stridor. No respiratory distress. She has no wheezes. She has no rales.  Abdominal: Soft. Bowel sounds are normal. She exhibits no distension and no mass. There is no hepatomegaly. There is no tenderness. There is no rebound  and no guarding.  Musculoskeletal:  Gait unsteady  Lymphadenopathy:    She has no cervical adenopathy.  Neurological: She is alert.  Skin: Skin is warm and dry. No rash noted. There is erythema (increased redness of b/l leg).  Psychiatric: She has a normal mood and affect. Her behavior is normal.     Labs reviewed: No visits with results within 3 Month(s) from this visit. Latest known visit with results is:  Admission on 04/02/2014, Discharged on 04/02/2014  Component Date Value Ref Range Status  . WBC 04/02/2014 10.8* 4.0 - 10.5 K/uL Final  . RBC 04/02/2014 4.33  3.87 - 5.11 MIL/uL Final  . Hemoglobin 04/02/2014 14.3  12.0 - 15.0 g/dL Final  . HCT 04/02/2014 40.7  36.0 - 46.0 % Final  . MCV 04/02/2014 94.0  78.0 - 100.0 fL Final  . MCH 04/02/2014 33.0  26.0 - 34.0 pg Final  . MCHC 04/02/2014 35.1  30.0 - 36.0 g/dL Final  . RDW 04/02/2014 12.6  11.5 - 15.5 % Final  . Platelets 04/02/2014 159  150 - 400 K/uL Final  . Neutrophils Relative % 04/02/2014 79* 43 - 77 % Final  . Neutro Abs 04/02/2014 8.4* 1.7 - 7.7 K/uL Final  . Lymphocytes Relative 04/02/2014 16  12 - 46 % Final  . Lymphs Abs 04/02/2014 1.7  0.7 - 4.0 K/uL Final  . Monocytes Relative 04/02/2014 5  3 - 12 % Final  . Monocytes Absolute 04/02/2014 0.6  0.1 - 1.0 K/uL Final  . Eosinophils Relative 04/02/2014 0  0 - 5 % Final  . Eosinophils Absolute 04/02/2014 0.0  0.0 - 0.7 K/uL Final  . Basophils Relative 04/02/2014 0  0 - 1 % Final  . Basophils Absolute 04/02/2014 0.0  0.0 - 0.1 K/uL Final  . Sodium 04/02/2014 143  137 - 147 mEq/L Final  . Potassium 04/02/2014 3.0* 3.7 - 5.3 mEq/L Final  . Chloride 04/02/2014 108  96 - 112 mEq/L Final  . CO2 04/02/2014 24  19 - 32 mEq/L Final  . Glucose, Bld 04/02/2014 99  70 - 99 mg/dL Final  . BUN 04/02/2014 21  6 - 23 mg/dL Final  . Creatinine, Ser 04/02/2014 0.69  0.50 - 1.10 mg/dL Final  . Calcium 04/02/2014 9.1  8.4 - 10.5 mg/dL Final  . Total Protein 04/02/2014 6.2  6.0 -  8.3 g/dL Final  . Albumin 04/02/2014 3.4* 3.5 - 5.2 g/dL Final  . AST 04/02/2014 24  0 - 37 U/L Final  . ALT 04/02/2014 13  0 - 35 U/L Final  . Alkaline Phosphatase 04/02/2014 56  39 - 117 U/L Final  . Total Bilirubin 04/02/2014 0.4  0.3 - 1.2 mg/dL Final  . GFR calc non Af Amer 04/02/2014 80* >90 mL/min Final  . GFR calc Af Amer 04/02/2014 >90  >90 mL/min Final   Comment: (NOTE)                          The eGFR has been calculated using the CKD EPI equation.                          This calculation has not been validated in all clinical situations.                          eGFR's persistently <90 mL/min signify possible Chronic Kidney                          Disease.  . Anion gap 04/02/2014 11  5 - 15 Final  . Troponin I 04/02/2014 <0.30  <0.30 ng/mL Final   Comment:                                 Due to the release kinetics of cTnI,                          a negative result within the first hours                          of the onset of symptoms does not rule out                          myocardial infarction with certainty.                          If myocardial infarction is still suspected,                          repeat the test at appropriate intervals.    No results found.   Assessment/Plan   ICD-9-CM ICD-10-CM   1. Dementia, without behavioral disturbance - stable 294.20 F03.90   2. Benign essential HTN - stable 401.1 I10   3. History of stroke - stable V12.54 Z86.73   4. Hyperlipidemia LDL goal <100 - diet controlled 272.4 E78.5   5. Chronic obstructive pulmonary disease, unspecified COPD, unspecified chronic bronchitis type - hx tobacco abuse; stable 496 J44.9    --cont meds as ordered  --check CBC w diff and CMP as ordered  --gait unsteady and t/c PT eval and tx  --GOAL: long term care. Communicated with patient and nursing  --will follow  Nabiha Planck S. Perlie Gold  [facility] [address] [place], [place] [address] [phone] Cell (Monday-Friday 8 AM - 5 PM) [phone] After 5 PM and follow prompts

## 2014-12-30 ENCOUNTER — Non-Acute Institutional Stay (SKILLED_NURSING_FACILITY): Payer: Medicare Other | Admitting: Adult Health

## 2014-12-30 DIAGNOSIS — I10 Essential (primary) hypertension: Secondary | ICD-10-CM | POA: Diagnosis not present

## 2014-12-30 DIAGNOSIS — I69320 Aphasia following cerebral infarction: Secondary | ICD-10-CM

## 2014-12-30 DIAGNOSIS — I69920 Aphasia following unspecified cerebrovascular disease: Secondary | ICD-10-CM

## 2014-12-30 DIAGNOSIS — F0390 Unspecified dementia without behavioral disturbance: Secondary | ICD-10-CM

## 2015-01-09 DIAGNOSIS — G309 Alzheimer's disease, unspecified: Secondary | ICD-10-CM | POA: Diagnosis not present

## 2015-01-09 DIAGNOSIS — R262 Difficulty in walking, not elsewhere classified: Secondary | ICD-10-CM | POA: Diagnosis not present

## 2015-01-09 DIAGNOSIS — M6281 Muscle weakness (generalized): Secondary | ICD-10-CM | POA: Diagnosis not present

## 2015-01-09 DIAGNOSIS — J449 Chronic obstructive pulmonary disease, unspecified: Secondary | ICD-10-CM | POA: Diagnosis not present

## 2015-01-09 DIAGNOSIS — I10 Essential (primary) hypertension: Secondary | ICD-10-CM | POA: Diagnosis not present

## 2015-01-11 DIAGNOSIS — R262 Difficulty in walking, not elsewhere classified: Secondary | ICD-10-CM | POA: Diagnosis not present

## 2015-01-11 DIAGNOSIS — M6281 Muscle weakness (generalized): Secondary | ICD-10-CM | POA: Diagnosis not present

## 2015-01-11 DIAGNOSIS — J449 Chronic obstructive pulmonary disease, unspecified: Secondary | ICD-10-CM | POA: Diagnosis not present

## 2015-01-11 DIAGNOSIS — G309 Alzheimer's disease, unspecified: Secondary | ICD-10-CM | POA: Diagnosis not present

## 2015-01-11 DIAGNOSIS — I10 Essential (primary) hypertension: Secondary | ICD-10-CM | POA: Diagnosis not present

## 2015-01-14 DIAGNOSIS — M6281 Muscle weakness (generalized): Secondary | ICD-10-CM | POA: Diagnosis not present

## 2015-01-14 DIAGNOSIS — J449 Chronic obstructive pulmonary disease, unspecified: Secondary | ICD-10-CM | POA: Diagnosis not present

## 2015-01-14 DIAGNOSIS — I10 Essential (primary) hypertension: Secondary | ICD-10-CM | POA: Diagnosis not present

## 2015-01-14 DIAGNOSIS — R262 Difficulty in walking, not elsewhere classified: Secondary | ICD-10-CM | POA: Diagnosis not present

## 2015-01-14 DIAGNOSIS — G309 Alzheimer's disease, unspecified: Secondary | ICD-10-CM | POA: Diagnosis not present

## 2015-01-16 DIAGNOSIS — M6281 Muscle weakness (generalized): Secondary | ICD-10-CM | POA: Diagnosis not present

## 2015-01-16 DIAGNOSIS — R262 Difficulty in walking, not elsewhere classified: Secondary | ICD-10-CM | POA: Diagnosis not present

## 2015-01-16 DIAGNOSIS — J449 Chronic obstructive pulmonary disease, unspecified: Secondary | ICD-10-CM | POA: Diagnosis not present

## 2015-01-16 DIAGNOSIS — I10 Essential (primary) hypertension: Secondary | ICD-10-CM | POA: Diagnosis not present

## 2015-01-16 DIAGNOSIS — G309 Alzheimer's disease, unspecified: Secondary | ICD-10-CM | POA: Diagnosis not present

## 2015-02-08 ENCOUNTER — Emergency Department (HOSPITAL_COMMUNITY): Payer: Medicare Other

## 2015-02-08 ENCOUNTER — Encounter (HOSPITAL_COMMUNITY): Payer: Self-pay | Admitting: Nurse Practitioner

## 2015-02-08 ENCOUNTER — Inpatient Hospital Stay (HOSPITAL_COMMUNITY)
Admission: EM | Admit: 2015-02-08 | Discharge: 2015-02-09 | DRG: 085 | Disposition: A | Discharge status: Nursing Home (Includes ICF) | Payer: Medicare Other | Attending: Internal Medicine | Admitting: Internal Medicine

## 2015-02-08 ENCOUNTER — Encounter (HOSPITAL_COMMUNITY)
Admission: EM | Disposition: A | Discharge status: Nursing Home (Includes ICF) | Payer: Self-pay | Source: Home / Self Care | Attending: Internal Medicine

## 2015-02-08 DIAGNOSIS — J449 Chronic obstructive pulmonary disease, unspecified: Secondary | ICD-10-CM | POA: Diagnosis not present

## 2015-02-08 DIAGNOSIS — F0390 Unspecified dementia without behavioral disturbance: Secondary | ICD-10-CM | POA: Diagnosis not present

## 2015-02-08 DIAGNOSIS — S065X0A Traumatic subdural hemorrhage without loss of consciousness, initial encounter: Secondary | ICD-10-CM | POA: Diagnosis not present

## 2015-02-08 DIAGNOSIS — Z66 Do not resuscitate: Secondary | ICD-10-CM | POA: Diagnosis present

## 2015-02-08 DIAGNOSIS — G9340 Encephalopathy, unspecified: Secondary | ICD-10-CM | POA: Diagnosis not present

## 2015-02-08 DIAGNOSIS — S064X0A Epidural hemorrhage without loss of consciousness, initial encounter: Secondary | ICD-10-CM | POA: Diagnosis present

## 2015-02-08 DIAGNOSIS — S064X9A Epidural hemorrhage with loss of consciousness of unspecified duration, initial encounter: Secondary | ICD-10-CM

## 2015-02-08 DIAGNOSIS — M4312 Spondylolisthesis, cervical region: Secondary | ICD-10-CM | POA: Diagnosis not present

## 2015-02-08 DIAGNOSIS — Y92122 Bedroom in nursing home as the place of occurrence of the external cause: Secondary | ICD-10-CM

## 2015-02-08 DIAGNOSIS — I10 Essential (primary) hypertension: Secondary | ICD-10-CM | POA: Diagnosis present

## 2015-02-08 DIAGNOSIS — E559 Vitamin D deficiency, unspecified: Secondary | ICD-10-CM | POA: Diagnosis not present

## 2015-02-08 DIAGNOSIS — Z87891 Personal history of nicotine dependence: Secondary | ICD-10-CM

## 2015-02-08 DIAGNOSIS — Y92129 Unspecified place in nursing home as the place of occurrence of the external cause: Secondary | ICD-10-CM

## 2015-02-08 DIAGNOSIS — M47812 Spondylosis without myelopathy or radiculopathy, cervical region: Secondary | ICD-10-CM | POA: Diagnosis not present

## 2015-02-08 DIAGNOSIS — S065X9A Traumatic subdural hemorrhage with loss of consciousness of unspecified duration, initial encounter: Secondary | ICD-10-CM | POA: Diagnosis not present

## 2015-02-08 DIAGNOSIS — E785 Hyperlipidemia, unspecified: Secondary | ICD-10-CM | POA: Diagnosis present

## 2015-02-08 DIAGNOSIS — S064XAA Epidural hemorrhage with loss of consciousness status unknown, initial encounter: Secondary | ICD-10-CM | POA: Diagnosis present

## 2015-02-08 DIAGNOSIS — W19XXXA Unspecified fall, initial encounter: Secondary | ICD-10-CM | POA: Diagnosis present

## 2015-02-08 DIAGNOSIS — I959 Hypotension, unspecified: Secondary | ICD-10-CM | POA: Diagnosis not present

## 2015-02-08 DIAGNOSIS — S0101XA Laceration without foreign body of scalp, initial encounter: Secondary | ICD-10-CM | POA: Diagnosis not present

## 2015-02-08 DIAGNOSIS — T148 Other injury of unspecified body region: Secondary | ICD-10-CM | POA: Diagnosis not present

## 2015-02-08 DIAGNOSIS — I69320 Aphasia following cerebral infarction: Secondary | ICD-10-CM

## 2015-02-08 DIAGNOSIS — S199XXA Unspecified injury of neck, initial encounter: Secondary | ICD-10-CM | POA: Diagnosis not present

## 2015-02-08 DIAGNOSIS — S0990XA Unspecified injury of head, initial encounter: Secondary | ICD-10-CM | POA: Diagnosis not present

## 2015-02-08 HISTORY — DX: Unspecified dementia, unspecified severity, without behavioral disturbance, psychotic disturbance, mood disturbance, and anxiety: F03.90

## 2015-02-08 HISTORY — DX: Unspecified urinary incontinence: R32

## 2015-02-08 LAB — CBC WITH DIFFERENTIAL/PLATELET
Basophils Absolute: 0 10*3/uL (ref 0.0–0.1)
Basophils Relative: 0 % (ref 0–1)
Eosinophils Absolute: 0 10*3/uL (ref 0.0–0.7)
Eosinophils Relative: 0 % (ref 0–5)
HCT: 30.5 % — ABNORMAL LOW (ref 36.0–46.0)
HEMOGLOBIN: 10.5 g/dL — AB (ref 12.0–15.0)
LYMPHS ABS: 1.4 10*3/uL (ref 0.7–4.0)
Lymphocytes Relative: 12 % (ref 12–46)
MCH: 32.3 pg (ref 26.0–34.0)
MCHC: 34.4 g/dL (ref 30.0–36.0)
MCV: 93.8 fL (ref 78.0–100.0)
Monocytes Absolute: 0.5 10*3/uL (ref 0.1–1.0)
Monocytes Relative: 5 % (ref 3–12)
Neutro Abs: 9.3 10*3/uL — ABNORMAL HIGH (ref 1.7–7.7)
Neutrophils Relative %: 83 % — ABNORMAL HIGH (ref 43–77)
Platelets: 163 10*3/uL (ref 150–400)
RBC: 3.25 MIL/uL — AB (ref 3.87–5.11)
RDW: 12.8 % (ref 11.5–15.5)
WBC: 11.3 10*3/uL — ABNORMAL HIGH (ref 4.0–10.5)

## 2015-02-08 LAB — BASIC METABOLIC PANEL
ANION GAP: 9 (ref 5–15)
BUN: 25 mg/dL — ABNORMAL HIGH (ref 6–20)
CHLORIDE: 106 mmol/L (ref 101–111)
CO2: 26 mmol/L (ref 22–32)
Calcium: 9.2 mg/dL (ref 8.9–10.3)
Creatinine, Ser: 1.02 mg/dL — ABNORMAL HIGH (ref 0.44–1.00)
GFR calc Af Amer: 58 mL/min — ABNORMAL LOW (ref >60)
GFR calc non Af Amer: 50 mL/min — ABNORMAL LOW (ref >60)
Glucose, Bld: 139 mg/dL — ABNORMAL HIGH (ref 65–99)
Potassium: 4.3 mmol/L (ref 3.5–5.1)
Sodium: 141 mmol/L (ref 135–145)

## 2015-02-08 LAB — PROTIME-INR
INR: 1.09 (ref 0.00–1.49)
Prothrombin Time: 14.3 seconds (ref 11.6–15.2)

## 2015-02-08 LAB — MRSA PCR SCREENING: MRSA by PCR: NEGATIVE

## 2015-02-08 SURGERY — CRANIOTOMY HEMATOMA EVACUATION EPIDURAL
Anesthesia: General | Laterality: Right

## 2015-02-08 MED ORDER — VERAPAMIL HCL 80 MG PO TABS
80.0000 mg | ORAL_TABLET | Freq: Three times a day (TID) | ORAL | Status: DC
  Filled 2015-02-08 (×2): qty 1

## 2015-02-08 MED ORDER — DONEPEZIL HCL 5 MG PO TABS
5.0000 mg | ORAL_TABLET | Freq: Every day | ORAL | Status: DC
  Filled 2015-02-08: qty 1

## 2015-02-08 MED ORDER — LIDOCAINE-EPINEPHRINE 1 %-1:100000 IJ SOLN
10.0000 mL | Freq: Once | INTRAMUSCULAR | Status: AC
  Administered 2015-02-08: 10 mL

## 2015-02-08 NOTE — ED Notes (Signed)
Per EMS pt from [facility] to be evaluated for assumed fall. Pt is non-verbal with severe dementia and was checked on at 3AM at approximately 6AM pt seen sitting on chair in room with head and hands covered in blood and on floor and chair. Unknown LOC, or injury. Pt not guarding, staff states pt disposition at baseline. Pt alert, skin cool and dry. Pupils are equal and reactive. Pt placed in C-collar for assumed fall.

## 2015-02-08 NOTE — ED Provider Notes (Signed)
12:43 PM LACERATION REPAIR Performed by: Emilia Beck Authorized by: Emilia Beck Consent: Verbal consent obtained. Risks and benefits: risks, benefits and alternatives were discussed Consent given by: patient Patient identity confirmed: provided demographic data Prepped and Draped in normal sterile fashion Wound explored  Laceration Location: right temporoparietal scalp  Laceration Length: 4 cm  No Foreign Bodies seen or palpated  Anesthesia: local infiltration  Local anesthetic: lidocaine 1% with epinephrine  Anesthetic total: 3 ml  Irrigation method: syringe Amount of cleaning: standard  Skin closure: staples  Number of staples: 3  Technique: n/a  Patient tolerance: Patient tolerated the procedure well with no immediate complications.   Emilia Beck, PA-C 02/08/15 1245  Rolland Porter, MD 02/14/15 2337

## 2015-02-08 NOTE — ED Notes (Signed)
Attempted report 

## 2015-02-08 NOTE — Progress Notes (Addendum)
Brittany Powers [id number] Admission Data: 02/08/2015 1:48 PM Attending Provider: Earl Lagos, MD  OEC:[id number] L, MD Consults/ Treatment Team: Treatment Team:  Palliative [name] is a 81 y.o. female patient admitted from ED, alert to voice, mainly non-verbal  DNR, VSS - Blood pressure 120/52, pulse 79, temperature 98.4 F (36.9 C), temperature source Oral, resp. rate 18, SpO2 97 %.,     Allergies:   Allergies  Allergen Reactions  . Bee Venom Itching and Swelling     Past Medical History  Diagnosis Date  . CVA (cerebral infarction)   . Hypertension   . High cholesterol   . Dementia      Pt orientation to unit, room and routine. Information packet given to patient/family and safety video watched.  Admission INP armband ID verified with patient/family, and in place. SR up x 2, fall risk assessment complete with Patient and family verbalizing understanding of risks associated with falls. Pt verbalizes an understanding of how to use the call bell and to call for help before getting out of bed.  Skin- some bruising noted to lower legs and top of feet. No skin tears noted.      Will cont to monitor and assist as needed.  Kern Reap, RN 02/08/2015 1:48 PM

## 2015-02-08 NOTE — ED Notes (Signed)
Dr. Yetta Barre- neurosurgery at bedside speaking to family.

## 2015-02-08 NOTE — H&P (Signed)
Date: 02/08/2015               Patient Name:  Brittany Powers MRN: [id number]  DOB: [birth year] Age / Sex: 81 y.o., female   PCP: Kari Baars, MD         Medical Service: Internal Medicine Teaching Service         Attending Physician: Dr. Earl Lagos, MD    First Contact: Dr. Johna Roles Pager: [phone]  Second Contact: Dr. Loma Newton Pager: [phone]        After Hours (After 5p/  First Contact Pager: [phone]  weekends / holidays): Second Contact Pager: [phone]   Chief Complaint: Fall  History of Present Illness:  Brittany Powers is an 81 yr old woman, nursing home resident, with PMH of CVA, HTN, HLP, dementia with aphasia, and COPD, presenting for evaluation of fall at SNF. She sustained an unwitnessed fall the morning of her presentation. She was found by her bed at 6AM with bleeding at the right scalp. She was on Plavix due to CVA in the past. Upon arrival to the [facility], CT head revealed acute right-sided epidural hematoma with mass effect and mild midline shift. Dr. Yetta Barre with Neurosurgery evaluated the patient in the ED and discussed the findings with the family. [name] family decided not to proceed with emergent craniotomy at this time. The family at this time has decided to make the patient DNR/DNI. The palliative care team has been consulted. The IMTS was called for her hospitalization for continued medical care.    Meds: Current Facility-Administered Medications  Medication Dose Route Frequency Provider Last Rate Last Dose  . donepezil (ARICEPT) tablet 5 mg  5 mg Oral Daily Ky Barban, MD      . verapamil (CALAN) tablet 80 mg  80 mg Oral TID Ky Barban, MD        Allergies: Allergies as of 02/08/2015 - Review Complete 02/08/2015  Allergen Reaction Noted  . Bee venom Itching and Swelling 10/06/2012   Past Medical History  Diagnosis Date  . CVA (cerebral infarction)   . Hypertension   . High cholesterol   . Dementia   . Incontinent of urine     History reviewed. No pertinent past surgical history. History reviewed. No pertinent family history. History   Social History  . Marital Status: Divorced    Spouse Name: N/A  . Number of Children: N/A  . Years of Education: N/A   Occupational History  . Not on file.   Social History Main Topics  . Smoking status: Former Smoker    Types: Cigarettes    Quit date: 12/26/2014  . Smokeless tobacco: Never Used     Comment: " smoked most of her life "  . Alcohol Use: No  . Drug Use: No  . Sexual Activity: No   Other Topics Concern  . Not on file   Social History Narrative    Review of Systems: Unable to obtain due to the patient's condition (aphasic)  Physical Exam: Blood pressure 120/52, pulse 79, temperature 98.4 F (36.9 C), temperature source Oral, resp. rate 18, SpO2 97 %. Physical Exam  Nursing note and vitals reviewed. Constitutional: No distress.  Resting in bed  HENT:  Laceration of right parietal head with no active bleeding  Eyes: Pupils are equal, round, and reactive to light.  Cardiovascular: Normal rate.   Respiratory: No respiratory distress. She has no wheezes. She has no rales.  GI: Soft. There is no tenderness.  Musculoskeletal:  She exhibits no edema or tenderness.  Aphasic   Neurological: She is alert.  Skin: Skin is warm and dry. She is not diaphoretic.  Psychiatric: She has a normal mood and affect.     Lab results: Basic Metabolic Panel:  Recent Labs  16/10/96 0931  NA 141  K 4.3  CL 106  CO2 26  GLUCOSE 139*  BUN 25*  CREATININE 1.02*  CALCIUM 9.2   CBC:  Recent Labs  02/08/15 0931  WBC 11.3*  NEUTROABS 9.3*  HGB 10.5*  HCT 30.5*  MCV 93.8  PLT 163   Coagulation:  Recent Labs  02/08/15 0931  LABPROT 14.3  INR 1.09    Imaging results:  Ct Head Wo Contrast  02/08/2015   CLINICAL DATA:  Dementia.  Questionable fall  EXAM: CT HEAD WITHOUT CONTRAST  CT CERVICAL SPINE WITHOUT CONTRAST  TECHNIQUE: Multidetector  CT imaging of the head and cervical spine was performed following the standard protocol without intravenous contrast. Multiplanar CT image reconstructions of the cervical spine were also generated.  COMPARISON:  CT head April 02, 2014; CT cervical spine October 06, 2012  FINDINGS: CT HEAD FINDINGS  There is mild diffuse atrophy. There is an epidural hematoma in the right temporal lobe region, acute, measuring 4.4 x 2.0 cm, causing localized mass effect and 4 mm of midline shift toward the left. There is also a degree of subdural hematoma, acute, slightly superior to the epidural hematoma with a maximum thickness of the subdural hematoma measuring 9 mm. There is no intra-axial hemorrhage. There is also a subdural hygroma on the right more superiorly causing mild mass effect. The maximum thickness of the subdural hygroma on the right is 1.1 cm. There is a right parietal scalp hematoma. Intracranially, there are multiple lacunar infarcts throughout the centra semiovale bilaterally. No acute infarct apparent. The bony calvarium appears intact. There is chronic frontal sinus disease on the left, stable. Mastoid air cells are clear.  CT CERVICAL SPINE FINDINGS  There is no demonstrable fracture. There is rather minimal anterolisthesis of C5 on C6, stable and felt to be consistent with underlying spondylosis. There is no new spondylolisthesis. Prevertebral soft tissues and predental space regions are normal. There is moderately severe disc space narrowing at C6-7, stable. There is multilevel facet hypertrophy bilaterally, stable. No frank disc extrusion or stenosis. The thyroid appears inhomogeneous consistent with multinodular goiter. This appearance is stable.  IMPRESSION: CT head: Right-sided epidural hematoma, acute, causing mass effect and mild midline shift. Slightly more superiorly, there is a smaller acute subdural hematoma as well as a subdural hygroma slightly more superiorly with mild mass effect on the  superior frontal lobe. Intracranially, there are several prior lacunar infarcts in the centra semiovale bout laterally, stable. No acute infarct evident. No intra-axial hemorrhage evident. There is a sizable right parietal scalp hematoma. No fracture evident.  CT cervical spine: Multilevel arthropathy. Slight spondylolisthesis at C5-6 is stable and felt to be due to underlying spondylosis. No new spondylolisthesis. No fracture. Multinodular goiter, stable.  Critical Value/emergent results were called by telephone at the time of interpretation on 02/08/2015 at 9:12 am to Dr. Rolland Porter , who verbally acknowledged these results.   Electronically Signed   By: Bretta Bang III M.D.   On: 02/08/2015 09:13   Ct Cervical Spine Wo Contrast  02/08/2015   CLINICAL DATA:  Dementia.  Questionable fall  EXAM: CT HEAD WITHOUT CONTRAST  CT CERVICAL SPINE WITHOUT CONTRAST  TECHNIQUE: Multidetector CT imaging of  the head and cervical spine was performed following the standard protocol without intravenous contrast. Multiplanar CT image reconstructions of the cervical spine were also generated.  COMPARISON:  CT head April 02, 2014; CT cervical spine October 06, 2012  FINDINGS: CT HEAD FINDINGS  There is mild diffuse atrophy. There is an epidural hematoma in the right temporal lobe region, acute, measuring 4.4 x 2.0 cm, causing localized mass effect and 4 mm of midline shift toward the left. There is also a degree of subdural hematoma, acute, slightly superior to the epidural hematoma with a maximum thickness of the subdural hematoma measuring 9 mm. There is no intra-axial hemorrhage. There is also a subdural hygroma on the right more superiorly causing mild mass effect. The maximum thickness of the subdural hygroma on the right is 1.1 cm. There is a right parietal scalp hematoma. Intracranially, there are multiple lacunar infarcts throughout the centra semiovale bilaterally. No acute infarct apparent. The bony calvarium  appears intact. There is chronic frontal sinus disease on the left, stable. Mastoid air cells are clear.  CT CERVICAL SPINE FINDINGS  There is no demonstrable fracture. There is rather minimal anterolisthesis of C5 on C6, stable and felt to be consistent with underlying spondylosis. There is no new spondylolisthesis. Prevertebral soft tissues and predental space regions are normal. There is moderately severe disc space narrowing at C6-7, stable. There is multilevel facet hypertrophy bilaterally, stable. No frank disc extrusion or stenosis. The thyroid appears inhomogeneous consistent with multinodular goiter. This appearance is stable.  IMPRESSION: CT head: Right-sided epidural hematoma, acute, causing mass effect and mild midline shift. Slightly more superiorly, there is a smaller acute subdural hematoma as well as a subdural hygroma slightly more superiorly with mild mass effect on the superior frontal lobe. Intracranially, there are several prior lacunar infarcts in the centra semiovale bout laterally, stable. No acute infarct evident. No intra-axial hemorrhage evident. There is a sizable right parietal scalp hematoma. No fracture evident.  CT cervical spine: Multilevel arthropathy. Slight spondylolisthesis at C5-6 is stable and felt to be due to underlying spondylosis. No new spondylolisthesis. No fracture. Multinodular goiter, stable.  Critical Value/emergent results were called by telephone at the time of interpretation on 02/08/2015 at 9:12 am to Dr. Rolland Porter , who verbally acknowledged these results.   Electronically Signed   By: Bretta Bang III M.D.   On: 02/08/2015 09:13    Other results: EKG: VR 85, PR interval 132, QRS 85, QTc 442,   Assessment & Plan by Problem: 81 yr old woman, nursing home resident, with PMH of CVA and aphasia, dementia, presenting with fall and laceration of right parietal scalp, right sided epidural hematoma with mass effect and midline shift, DNR/DNI.   Right sided  epidural hematoma: Per CT head with mass effect, midline shift. The patient does not have a HPOA but her 5 family members present during her hospitalization agree to no heroic measures at this time. Neurosurgery was promptly consulted while the patient was in the ED. Dr. Yetta Barre in Neurosurgery has discussed treatment options with the family, including emergent craniotomy but the family has opted to no surgical intervention at this time.  -Admit to Med-Surg -NPO until passes bedside RN swallow -No plavix tx due to brain bleed -Palliative care consult -frequent neuro checks  DVT prophylaxis: SCDs only due to brain bleed  FEN NS lock No lab check in am NPO until passes bedside swallow test, may need SLP eval if improves or comfort feeds if appropriate  Dispo: Disposition is deferred at this time, awaiting improvement of current medical problems. Anticipated discharge in approximately 2 day(s).   The patient does have a current PCP Kari Baars, MD) and does not need an [facility] hospital follow-up appointment after discharge.  The patient does not have transportation limitations that hinder transportation to clinic appointments.  Signed: Ky Barban, MD  IMTS, PGY3 02/08/2015, 1:56 PM

## 2015-02-08 NOTE — ED Notes (Signed)
PA at the bedside.

## 2015-02-08 NOTE — ED Provider Notes (Signed)
CSN: [id number]     Arrival date & time 02/08/15  0706 History   First MD Initiated Contact with Patient 02/08/15 [phone]     Chief Complaint  Patient presents with  . Fall      HPI  Patient presents for evaluation after fall at a nursing facility. She was checked on at 3 AM and was in bed. At 6 AM she was found sitting in a chair next to her bed. Some blood from the right scalp. Probably some blood on the floor and on a piece of adjacent furniture. Allegedly had a fall, struck her head. Is on Plavix with a history of CVA. History of dementia and aphasia at baseline.  Other apparent injuries  Past Medical History  Diagnosis Date  . CVA (cerebral infarction)   . Hypertension   . High cholesterol    History reviewed. No pertinent past surgical history. No family history on file. History  Substance Use Topics  . Smoking status: Current Every Day Smoker  . Smokeless tobacco: Not on file  . Alcohol Use: No   OB History    No data available     Review of Systems  Unable to perform ROS: Dementia      Allergies  Bee venom  Home Medications   Prior to Admission medications   Medication Sig Start Date End Date Taking? Authorizing Provider  aspirin EC 81 MG tablet Take 81 mg by mouth every evening.   Yes Historical Provider, MD  Calcium Carbonate-Vitamin D (CALCIUM 500 + D PO) Take 1 tablet by mouth 2 (two) times daily.   Yes Historical Provider, MD  clopidogrel (PLAVIX) 75 MG tablet Take 75 mg by mouth every morning.   Yes Historical Provider, MD  donepezil (ARICEPT) 5 MG tablet Take 5 mg by mouth daily.   Yes Historical Provider, MD  fish oil-omega-3 fatty acids 1000 MG capsule Take 1 g by mouth 2 (two) times daily.   Yes Historical Provider, MD  hydrochlorothiazide (HYDRODIURIL) 25 MG tablet Take 12.5 mg by mouth every morning.   Yes Historical Provider, MD  naproxen sodium (ALEVE) 220 MG tablet Take 220 mg by mouth daily.    Yes Historical Provider, MD  verapamil (CALAN) 80  MG tablet Take 80 mg by mouth 3 (three) times daily. Takes last dose two hours prior to evening meal   Yes Historical Provider, MD  Vitamin D, Ergocalciferol, (DRISDOL) 50000 UNITS CAPS capsule Take 50,000 Units by mouth every 7 (seven) days. Wednesday   Yes Historical Provider, MD   BP 115/51 mmHg  Pulse 78  Temp(Src) 97.4 F (36.3 C) (Axillary)  Resp 14  SpO2 98% Physical Exam  Constitutional: She appears well-developed and well-nourished. No distress.  HENT:  Head:    Eyes: Conjunctivae are normal. Pupils are equal, round, and reactive to light. No scleral icterus.  Neck: Normal range of motion. Neck supple. No thyromegaly present.  Cardiovascular: Normal rate and regular rhythm.  Exam reveals no gallop and no friction rub.   No murmur heard. Pulmonary/Chest: Effort normal and breath sounds normal. No respiratory distress. She has no wheezes. She has no rales.  Abdominal: Soft. Bowel sounds are normal. She exhibits no distension. There is no tenderness. There is no rebound.  Musculoskeletal: Normal range of motion.  Neurological: She is alert.  Moves all 4 extremities without any apparent discomfort.  Eyes midline and conjugate. Eyes midline, conjugate.  3mm reactive pupils.  Aphasic  Skin: Skin is warm and dry. No  rash noted.  Psychiatric: She has a normal mood and affect. Her behavior is normal.    ED Course  Procedures (including critical care time) Labs Review Labs Reviewed  CBC WITH DIFFERENTIAL/PLATELET - Abnormal; Notable for the following:    WBC 11.3 (*)    RBC 3.25 (*)    Hemoglobin 10.5 (*)    HCT 30.5 (*)    Neutrophils Relative % 83 (*)    Neutro Abs 9.3 (*)    All other components within normal limits  PROTIME-INR  BASIC METABOLIC PANEL    Imaging Review Ct Head Wo Contrast  02/08/2015   CLINICAL DATA:  Dementia.  Questionable fall  EXAM: CT HEAD WITHOUT CONTRAST  CT CERVICAL SPINE WITHOUT CONTRAST  TECHNIQUE: Multidetector CT imaging of the head and  cervical spine was performed following the standard protocol without intravenous contrast. Multiplanar CT image reconstructions of the cervical spine were also generated.  COMPARISON:  CT head April 02, 2014; CT cervical spine October 06, 2012  FINDINGS: CT HEAD FINDINGS  There is mild diffuse atrophy. There is an epidural hematoma in the right temporal lobe region, acute, measuring 4.4 x 2.0 cm, causing localized mass effect and 4 mm of midline shift toward the left. There is also a degree of subdural hematoma, acute, slightly superior to the epidural hematoma with a maximum thickness of the subdural hematoma measuring 9 mm. There is no intra-axial hemorrhage. There is also a subdural hygroma on the right more superiorly causing mild mass effect. The maximum thickness of the subdural hygroma on the right is 1.1 cm. There is a right parietal scalp hematoma. Intracranially, there are multiple lacunar infarcts throughout the centra semiovale bilaterally. No acute infarct apparent. The bony calvarium appears intact. There is chronic frontal sinus disease on the left, stable. Mastoid air cells are clear.  CT CERVICAL SPINE FINDINGS  There is no demonstrable fracture. There is rather minimal anterolisthesis of C5 on C6, stable and felt to be consistent with underlying spondylosis. There is no new spondylolisthesis. Prevertebral soft tissues and predental space regions are normal. There is moderately severe disc space narrowing at C6-7, stable. There is multilevel facet hypertrophy bilaterally, stable. No frank disc extrusion or stenosis. The thyroid appears inhomogeneous consistent with multinodular goiter. This appearance is stable.  IMPRESSION: CT head: Right-sided epidural hematoma, acute, causing mass effect and mild midline shift. Slightly more superiorly, there is a smaller acute subdural hematoma as well as a subdural hygroma slightly more superiorly with mild mass effect on the superior frontal lobe.  Intracranially, there are several prior lacunar infarcts in the centra semiovale bout laterally, stable. No acute infarct evident. No intra-axial hemorrhage evident. There is a sizable right parietal scalp hematoma. No fracture evident.  CT cervical spine: Multilevel arthropathy. Slight spondylolisthesis at C5-6 is stable and felt to be due to underlying spondylosis. No new spondylolisthesis. No fracture. Multinodular goiter, stable.  Critical Value/emergent results were called by telephone at the time of interpretation on 02/08/2015 at 9:12 am to Dr. Rolland Porter , who verbally acknowledged these results.   Electronically Signed   By: Bretta Bang III M.D.   On: 02/08/2015 09:13   Ct Cervical Spine Wo Contrast  02/08/2015   CLINICAL DATA:  Dementia.  Questionable fall  EXAM: CT HEAD WITHOUT CONTRAST  CT CERVICAL SPINE WITHOUT CONTRAST  TECHNIQUE: Multidetector CT imaging of the head and cervical spine was performed following the standard protocol without intravenous contrast. Multiplanar CT image reconstructions of the cervical spine  were also generated.  COMPARISON:  CT head April 02, 2014; CT cervical spine October 06, 2012  FINDINGS: CT HEAD FINDINGS  There is mild diffuse atrophy. There is an epidural hematoma in the right temporal lobe region, acute, measuring 4.4 x 2.0 cm, causing localized mass effect and 4 mm of midline shift toward the left. There is also a degree of subdural hematoma, acute, slightly superior to the epidural hematoma with a maximum thickness of the subdural hematoma measuring 9 mm. There is no intra-axial hemorrhage. There is also a subdural hygroma on the right more superiorly causing mild mass effect. The maximum thickness of the subdural hygroma on the right is 1.1 cm. There is a right parietal scalp hematoma. Intracranially, there are multiple lacunar infarcts throughout the centra semiovale bilaterally. No acute infarct apparent. The bony calvarium appears intact. There is  chronic frontal sinus disease on the left, stable. Mastoid air cells are clear.  CT CERVICAL SPINE FINDINGS  There is no demonstrable fracture. There is rather minimal anterolisthesis of C5 on C6, stable and felt to be consistent with underlying spondylosis. There is no new spondylolisthesis. Prevertebral soft tissues and predental space regions are normal. There is moderately severe disc space narrowing at C6-7, stable. There is multilevel facet hypertrophy bilaterally, stable. No frank disc extrusion or stenosis. The thyroid appears inhomogeneous consistent with multinodular goiter. This appearance is stable.  IMPRESSION: CT head: Right-sided epidural hematoma, acute, causing mass effect and mild midline shift. Slightly more superiorly, there is a smaller acute subdural hematoma as well as a subdural hygroma slightly more superiorly with mild mass effect on the superior frontal lobe. Intracranially, there are several prior lacunar infarcts in the centra semiovale bout laterally, stable. No acute infarct evident. No intra-axial hemorrhage evident. There is a sizable right parietal scalp hematoma. No fracture evident.  CT cervical spine: Multilevel arthropathy. Slight spondylolisthesis at C5-6 is stable and felt to be due to underlying spondylosis. No new spondylolisthesis. No fracture. Multinodular goiter, stable.  Critical Value/emergent results were called by telephone at the time of interpretation on 02/08/2015 at 9:12 am to Dr. Rolland Porter , who verbally acknowledged these results.   Electronically Signed   By: Bretta Bang III M.D.   On: 02/08/2015 09:13     EKG Interpretation   Date/Time:  Wednesday February 08 2015 07:12:10 EDT Ventricular Rate:  85 PR Interval:  132 QRS Duration: 85 QT Interval:  372 QTC Calculation: 442 R Axis:   52 Text Interpretation:  Sinus rhythm Confirmed by Fayrene Fearing  MD, Paizleigh Wilds ([id number]) on  02/08/2015 7:47:57 AM      MDM   Final diagnoses:  Fall  Epidural hematoma       Results of  CT scan called to me by radiology as I was in an additional room caring for an emergent cardiac patient. I placed a call immediately to neurosurgery.  Was able to gather 5 family members including daughters and son as well as in-laws. We spoke at length. Patient is DO NOT RESUSCITATE. Family does not have power of attorney. She does have a living will. All are in agreement they would want her to not undergo her procedure. Dr. Yetta Barre presented himself immediately to the emergency room as I was having this conversation. He has examined the patient is spoken with family at length. All are in agreement that the patient will not undergo neurosurgical procedure. They understand that this may result in her ultimate demise. I discussed the case via unassigned medicine  call list to the internal medicine resident/faculty service. I placed a call to, and discussed the case with palliative medicine, who will see the patient in consultation.  Rolland Porter, MD 02/08/15 1014

## 2015-02-08 NOTE — Progress Notes (Signed)
Chaplain responded to page for pt and family requesting advanced directive. Pt unable to verbalize at this point. Family is in agreement for pt care at this point and are following what they be to believe patient wishes from living will. Although pt living will is several years old, pt siblings believe that it reflects what her wishes are. Chaplain encouraged pt family to bring the living will in. Chaplain informed family that should pt come to a place where she desires an advanced directive then chaplain will be happy to come back. Chaplain offered prayer with family. Chaplain informed pt family of her continued services as needed.    02/08/15 1400  Clinical Encounter Type  Visited With Family;Health care provider  Visit Type Initial;Spiritual support  Spiritual Encounters  Spiritual Needs Emotional;Prayer  Stress Factors  Family Stress Factors Major life changes  Camia Dipinto, [place] 02/08/2015 2:48 PM

## 2015-02-09 ENCOUNTER — Inpatient Hospital Stay (HOSPITAL_COMMUNITY): Payer: Medicare Other

## 2015-02-09 ENCOUNTER — Encounter (HOSPITAL_COMMUNITY): Payer: Self-pay | Admitting: Radiology

## 2015-02-09 DIAGNOSIS — Y92122 Bedroom in nursing home as the place of occurrence of the external cause: Secondary | ICD-10-CM

## 2015-02-09 DIAGNOSIS — S0101XA Laceration without foreign body of scalp, initial encounter: Secondary | ICD-10-CM

## 2015-02-09 DIAGNOSIS — F0390 Unspecified dementia without behavioral disturbance: Secondary | ICD-10-CM

## 2015-02-09 DIAGNOSIS — E785 Hyperlipidemia, unspecified: Secondary | ICD-10-CM

## 2015-02-09 DIAGNOSIS — Z66 Do not resuscitate: Secondary | ICD-10-CM

## 2015-02-09 DIAGNOSIS — E559 Vitamin D deficiency, unspecified: Secondary | ICD-10-CM

## 2015-02-09 DIAGNOSIS — S065X0A Traumatic subdural hemorrhage without loss of consciousness, initial encounter: Principal | ICD-10-CM

## 2015-02-09 DIAGNOSIS — I10 Essential (primary) hypertension: Secondary | ICD-10-CM

## 2015-02-09 LAB — CBC
HCT: 27.1 % — ABNORMAL LOW (ref 36.0–46.0)
Hemoglobin: 9.2 g/dL — ABNORMAL LOW (ref 12.0–15.0)
MCH: 31.8 pg (ref 26.0–34.0)
MCHC: 33.9 g/dL (ref 30.0–36.0)
MCV: 93.8 fL (ref 78.0–100.0)
PLATELETS: 146 10*3/uL — AB (ref 150–400)
RBC: 2.89 MIL/uL — AB (ref 3.87–5.11)
RDW: 12.9 % (ref 11.5–15.5)
WBC: 6.4 10*3/uL (ref 4.0–10.5)

## 2015-02-09 MED ORDER — CALCIUM CARBONATE 1250 (500 CA) MG PO TABS: 1.0000 | ORAL_TABLET | Freq: Every day | ORAL | Status: DC

## 2015-02-09 NOTE — Discharge Instructions (Addendum)
-   STOP taking Plavix and aspirin, as this medication increases bleeding and can be dangerous after a brain bleed - Please be on the lookout for any new symptoms such as decreased alertness, weakness, dizziness, unstable gait, increased difficulty speaking or eating. Please alert the nursing home staff if you notice anything concerning. - Please follow up with your primary care doctor within the next week.  - It was a pleasure meeting you and your family, and we hope you feel better!

## 2015-02-09 NOTE — Progress Notes (Signed)
Subjective:    No acute events overnight. Patient appears much improved with increased alertness and language. According to her sister, her mental status appears at baseline. She ambulated in her room this morning. Patient was evaluated by speech therapy and deemed safe for a regular diet. No fever, pain, nausea/vomiting, or diarrhea.     Objective:    Vital Signs:   Temp:  [98.2 F (36.8 C)-98.7 F (37.1 C)] 98.2 F (36.8 C) (06/16 0606) Pulse Rate:  [79-89] 85 (06/16 0606) Resp:  [16-19] 16 (06/16 0606) BP: (90-120)/(46-59) 117/54 mmHg (06/16 0606) SpO2:  [97 %-100 %] 98 % (06/16 0606) Weight:  [51 kg (112 lb 7 oz)-51.5 kg (113 lb 8.6 oz)] 51 kg (112 lb 7 oz) (06/15 2113) Last BM Date: 02/08/15  Intake/Output: Not recorded    Physical Exam Constitutional: Alert older woman, lying in bed in no acute distress. HEENT: Laceration of right parietal head with 3 staples in place. No active bleeding. Oropharynx with dry mucous membranes. Cardiovascular: Normal rate, regular rhythm, no murmurs appreciated.  Respiratory: No respiratory distress on room air. CTAB with no wheeze, rales, or crackles.  GI: Soft, non-tender, non-distended, active bowel sounds.  Extremities: Warm, well perfused, no edema Neuro: Alert and oriented x 3. Aphasic but able to answer questions with 1-2 word answers. Receptive language appears intact. Face symmetric. Strength is 4+/5 in bilateral extremities. No focal deficits. Skin: Skin is warm and dry, not diaphoretic. No rash or lesions seen.    Labs:  Basic Metabolic Panel:  Recent Labs Lab 02/08/15 0931  NA 141  K 4.3  CL 106  CO2 26  GLUCOSE 139*  BUN 25*  CREATININE 1.02*  CALCIUM 9.2   CBC:  Recent Labs Lab 02/08/15 0931 02/09/15 0618  WBC 11.3* 6.4  NEUTROABS 9.3*  --   HGB 10.5* 9.2*  HCT 30.5* 27.1*  MCV 93.8 93.8  PLT 163 146*    Microbiology: Results for orders placed or performed during the hospital encounter of 02/08/15    MRSA PCR Screening     Status: None   Collection Time: 02/08/15  1:47 PM  Result Value Ref Range Status   MRSA by PCR NEGATIVE NEGATIVE Final    Comment:        The GeneXpert MRSA Assay (FDA approved for NASAL specimens only), is one component of a comprehensive MRSA colonization surveillance program. It is not intended to diagnose MRSA infection nor to guide or monitor treatment for MRSA infections.     Coagulation Studies:  Recent Labs  02/08/15 0931  LABPROT 14.3  INR 1.09    Imaging: Ct Head Wo Contrast  02/09/2015   CLINICAL DATA:  Follow-up epidural hematoma. History of stroke, hypertension, dementia.  EXAM: CT HEAD WITHOUT CONTRAST  TECHNIQUE: Contiguous axial images were obtained from the base of the skull through the vertex without intravenous contrast.  COMPARISON:  CT head February 08, 2015  FINDINGS: Dense 19 mm transverse RIGHT frontotemporal lentiform extra-axial fluid collection, in addition to 2-3 mm RIGHT parietal subdural hematoma. 3 mm RIGHT to LEFT midline shift. Small amount of RIGHT frontal, bilateral occipital horn intraventricular blood products without hydrocephalus. Patchy to confluent supratentorial white matter hypodensities most consistent with chronic small vessel ischemic disease. No acute large vascular territory infarct.  Basal cisterns are patent. Moderate calcific atherosclerosis of the carotid siphons. Paranasal sinuses are well-aerated, frontal osteoma versus calcified mucosal retention cyst. The mastoid air cells are well aerated. No skull fracture. Moderate RIGHT scalp  hematoma.  IMPRESSION: Similar lentiform RIGHT frontotemporal extra-axial hematoma, likely subdural with 2-3 mm RIGHT parietal subdural component. No skull fracture.  Similar 3 mm RIGHT to LEFT midline shift. Small amount of intraventricular blood products without hydrocephalus.  Stable chronic changes including moderate to severe white matter changes most consistent with chronic small  vessel ischemic disease.   Electronically Signed   By: Awilda Metro M.D.   On: 02/09/2015 06:25   Ct Head Wo Contrast  02/08/2015   CLINICAL DATA:  Dementia.  Questionable fall  EXAM: CT HEAD WITHOUT CONTRAST  CT CERVICAL SPINE WITHOUT CONTRAST  TECHNIQUE: Multidetector CT imaging of the head and cervical spine was performed following the standard protocol without intravenous contrast. Multiplanar CT image reconstructions of the cervical spine were also generated.  COMPARISON:  CT head April 02, 2014; CT cervical spine October 06, 2012  FINDINGS: CT HEAD FINDINGS  There is mild diffuse atrophy. There is an epidural hematoma in the right temporal lobe region, acute, measuring 4.4 x 2.0 cm, causing localized mass effect and 4 mm of midline shift toward the left. There is also a degree of subdural hematoma, acute, slightly superior to the epidural hematoma with a maximum thickness of the subdural hematoma measuring 9 mm. There is no intra-axial hemorrhage. There is also a subdural hygroma on the right more superiorly causing mild mass effect. The maximum thickness of the subdural hygroma on the right is 1.1 cm. There is a right parietal scalp hematoma. Intracranially, there are multiple lacunar infarcts throughout the centra semiovale bilaterally. No acute infarct apparent. The bony calvarium appears intact. There is chronic frontal sinus disease on the left, stable. Mastoid air cells are clear.  CT CERVICAL SPINE FINDINGS  There is no demonstrable fracture. There is rather minimal anterolisthesis of C5 on C6, stable and felt to be consistent with underlying spondylosis. There is no new spondylolisthesis. Prevertebral soft tissues and predental space regions are normal. There is moderately severe disc space narrowing at C6-7, stable. There is multilevel facet hypertrophy bilaterally, stable. No frank disc extrusion or stenosis. The thyroid appears inhomogeneous consistent with multinodular goiter. This  appearance is stable.  IMPRESSION: CT head: Right-sided epidural hematoma, acute, causing mass effect and mild midline shift. Slightly more superiorly, there is a smaller acute subdural hematoma as well as a subdural hygroma slightly more superiorly with mild mass effect on the superior frontal lobe. Intracranially, there are several prior lacunar infarcts in the centra semiovale bout laterally, stable. No acute infarct evident. No intra-axial hemorrhage evident. There is a sizable right parietal scalp hematoma. No fracture evident.  CT cervical spine: Multilevel arthropathy. Slight spondylolisthesis at C5-6 is stable and felt to be due to underlying spondylosis. No new spondylolisthesis. No fracture. Multinodular goiter, stable.  Critical Value/emergent results were called by telephone at the time of interpretation on 02/08/2015 at 9:12 am to Dr. Rolland Porter , who verbally acknowledged these results.   Electronically Signed   By: Bretta Bang III M.D.   On: 02/08/2015 09:13   Ct Cervical Spine Wo Contrast  02/08/2015   CLINICAL DATA:  Dementia.  Questionable fall  EXAM: CT HEAD WITHOUT CONTRAST  CT CERVICAL SPINE WITHOUT CONTRAST  TECHNIQUE: Multidetector CT imaging of the head and cervical spine was performed following the standard protocol without intravenous contrast. Multiplanar CT image reconstructions of the cervical spine were also generated.  COMPARISON:  CT head April 02, 2014; CT cervical spine October 06, 2012  FINDINGS: CT HEAD FINDINGS  There  is mild diffuse atrophy. There is an epidural hematoma in the right temporal lobe region, acute, measuring 4.4 x 2.0 cm, causing localized mass effect and 4 mm of midline shift toward the left. There is also a degree of subdural hematoma, acute, slightly superior to the epidural hematoma with a maximum thickness of the subdural hematoma measuring 9 mm. There is no intra-axial hemorrhage. There is also a subdural hygroma on the right more superiorly causing  mild mass effect. The maximum thickness of the subdural hygroma on the right is 1.1 cm. There is a right parietal scalp hematoma. Intracranially, there are multiple lacunar infarcts throughout the centra semiovale bilaterally. No acute infarct apparent. The bony calvarium appears intact. There is chronic frontal sinus disease on the left, stable. Mastoid air cells are clear.  CT CERVICAL SPINE FINDINGS  There is no demonstrable fracture. There is rather minimal anterolisthesis of C5 on C6, stable and felt to be consistent with underlying spondylosis. There is no new spondylolisthesis. Prevertebral soft tissues and predental space regions are normal. There is moderately severe disc space narrowing at C6-7, stable. There is multilevel facet hypertrophy bilaterally, stable. No frank disc extrusion or stenosis. The thyroid appears inhomogeneous consistent with multinodular goiter. This appearance is stable.  IMPRESSION: CT head: Right-sided epidural hematoma, acute, causing mass effect and mild midline shift. Slightly more superiorly, there is a smaller acute subdural hematoma as well as a subdural hygroma slightly more superiorly with mild mass effect on the superior frontal lobe. Intracranially, there are several prior lacunar infarcts in the centra semiovale bout laterally, stable. No acute infarct evident. No intra-axial hemorrhage evident. There is a sizable right parietal scalp hematoma. No fracture evident.  CT cervical spine: Multilevel arthropathy. Slight spondylolisthesis at C5-6 is stable and felt to be due to underlying spondylosis. No new spondylolisthesis. No fracture. Multinodular goiter, stable.  Critical Value/emergent results were called by telephone at the time of interpretation on 02/08/2015 at 9:12 am to Dr. Rolland Porter , who verbally acknowledged these results.   Electronically Signed   By: Bretta Bang III M.D.   On: 02/08/2015 09:13       Medications:    Infusions: None   Scheduled  Medications: None   PRN Medications: None    Assessment/ Plan:    Patient is an 81 yr old woman, nursing home resident, with PMHx of CVA (on Plavix), aphasia, dementia, and HTN who presented with a fall and laceration of right parietal scalp, found to have a right sided subdural hematoma with mass effect and midline shift.  Principal Problem:   Epidural hematoma Active Problems:   Fall at nursing home   HTN (hypertension)   Aphasia as late effect of cerebrovascular accident   Dementia   Right sided subdural hematoma: Repeat CT head this AM shows a likely subdural (not epidural) hematoma, stable mass effect and midline shift. Patient's neurologic status has improved significantly in the past 24 hours with alertness and language improved to baseline and no residual left-sided weakness. Patient's family members declined emergent craniotomy on admission in accordance with patient's previously stated wishes on a living will. Patient is DNR/DNI. - Speech evaluation-> regular diet with slow rate and small sips/bites - PT/OT consulted - Touch base with Neurosurgery recommendations for follow up - Continue holding home Plavix due to SDH - Hold antihypertensive medications as below - Counseled patient's sister on signs of neurologic deterioration and altered mental status. Patient's sister will advise nursing home staff of any concerns.  HTN: Currently with mild hypotension down to 90/46. - Hold home verapamil and HCTZ given current mild hypotension and to allow for permissive HTN - Consider restarting as outpatient if patient becomes hypertensive   Dementia: - Continue home Aricept at discharge   DVT prophylaxis: SCDs only due to brain bleed Diet: Regular diet Code: DNR/DNI  Dispo: Plan to discharge back to [facility] SN[facility] today.   The patient does have a current PCP Kari Baars, MD) and does not need an [facility] hospital follow-up appointment after discharge.  The patient does  not have transportation limitations that hinder transportation to clinic appointments.   SERVICE NEEDED AT DISCHARGE - TO BE DETERMINED DURING HOSPITAL COURSE         Y = Yes, Blank = No PT:   OT:   RN:   Equipment:   Other:      Length of Stay: 1 day(s)   Signed: Fausto Skillern, MS4

## 2015-02-09 NOTE — Discharge Summary (Signed)
Name: Brittany Powers MRN: [id number] DOB: May 17, 1931 81 y.o. PCP: Kari Baars, MD  Date of Admission: 02/08/2015  7:06 AM Date of Discharge: 02/09/2015 Attending Physician: Aletta Edouard, MD  Discharge Diagnosis:  Acute Right Subdural [place] scalp laceration Hypertension Dementia  Vitamin D Deficiency  Hyperlipidemia  DVT Prophylaxis     Discharge Medications:   Medication List    STOP taking these medications        ALEVE 220 MG tablet  Generic drug:  naproxen sodium     aspirin EC 81 MG tablet     CALCIUM 500 + D PO     clopidogrel 75 MG tablet  Commonly known as:  PLAVIX      TAKE these medications        calcium carbonate 1250 (500 CA) MG tablet  Commonly known as:  OS-CAL - dosed in mg of elemental calcium  Take 1 tablet (500 mg of elemental calcium total) by mouth daily with breakfast.     donepezil 5 MG tablet  Commonly known as:  ARICEPT  Take 5 mg by mouth daily.     fish oil-omega-3 fatty acids 1000 MG capsule  Take 1 g by mouth 2 (two) times daily.     hydrochlorothiazide 25 MG tablet  Commonly known as:  HYDRODIURIL  Take 12.5 mg by mouth every morning.     verapamil 80 MG tablet  Commonly known as:  CALAN  Take 80 mg by mouth 3 (three) times daily. Takes last dose two hours prior to evening meal     Vitamin D (Ergocalciferol) 50000 UNITS Caps capsule  Commonly known as:  DRISDOL  Take 50,000 Units by mouth every 7 (seven) days. Wednesday        Disposition and follow-up:   Ms.Brittany Powers was discharged from [facility] in Good condition.  At the hospital follow up visit please address:  1.  Right subdural hematoma and temporoparietal scalp laceration requiring 3 staples - please assess her mental status and monitor for neurological decline. Discontinued plavix and aspirin in setting of bleed.  Pt's PCP to decide if and when to start AP therapy with aspirin for secondary stroke prevention after  resolution of bleed. Avoid NSAID's. Please check her CBC.  Needs staple removal in 7-10 days.           Please provide close supervision to prevent future falls          Restart her blood pressure medications, HCTZ and verapamil once BP over 140/90, was holding them while she was hospitalized.         Vitamin D deficiency - Pt on high dose weekly vitamin D and calcium-vitamin D, changed to calcium by itself with high dose vitamin D. Pt needs 25-OH vitamin D level checked to see if she needs to continue high dose vitamin D.      2.  Labs / imaging needed at time of follow-up: CBC (Hg 9.2 on 02/09/15), 25-OH vitamin D level  3.  Pending labs/ test needing follow-up: None  Follow-up Appointments:  -Needs PCP follow-up with Dr. Juanetta Gosling in 1 week  Discharge Instructions:  -STOP taking Plavix and aspirin, as these medication increase bleeding and can be dangerous after a brain bleed - Please be on the lookout for any new symptoms such as decreased alertness, weakness, dizziness, unstable gait, increased difficulty speaking or eating. Please alert the nursing home staff if you notice anything concerning. - Please follow up with your  primary care doctor within the next week.  - It was a pleasure meeting you and your family, and we hope you feel better!  Consultations: Treatment Team:  Palliative [facility]  Procedures Performed:  Ct Head Wo Contrast  02/09/2015   CLINICAL DATA:  Follow-up epidural hematoma. History of stroke, hypertension, dementia.  EXAM: CT HEAD WITHOUT CONTRAST  TECHNIQUE: Contiguous axial images were obtained from the base of the skull through the vertex without intravenous contrast.  COMPARISON:  CT head February 08, 2015  FINDINGS: Dense 19 mm transverse RIGHT frontotemporal lentiform extra-axial fluid collection, in addition to 2-3 mm RIGHT parietal subdural hematoma. 3 mm RIGHT to LEFT midline shift. Small amount of RIGHT frontal, bilateral occipital horn intraventricular  blood products without hydrocephalus. Patchy to confluent supratentorial white matter hypodensities most consistent with chronic small vessel ischemic disease. No acute large vascular territory infarct.  Basal cisterns are patent. Moderate calcific atherosclerosis of the carotid siphons. Paranasal sinuses are well-aerated, frontal osteoma versus calcified mucosal retention cyst. The mastoid air cells are well aerated. No skull fracture. Moderate RIGHT scalp hematoma.  IMPRESSION: Similar lentiform RIGHT frontotemporal extra-axial hematoma, likely subdural with 2-3 mm RIGHT parietal subdural component. No skull fracture.  Similar 3 mm RIGHT to LEFT midline shift. Small amount of intraventricular blood products without hydrocephalus.  Stable chronic changes including moderate to severe white matter changes most consistent with chronic small vessel ischemic disease.   Electronically Signed   By: Awilda Metro M.D.   On: 02/09/2015 06:25   Ct Head Wo Contrast  02/08/2015   CLINICAL DATA:  Dementia.  Questionable fall  EXAM: CT HEAD WITHOUT CONTRAST  CT CERVICAL SPINE WITHOUT CONTRAST  TECHNIQUE: Multidetector CT imaging of the head and cervical spine was performed following the standard protocol without intravenous contrast. Multiplanar CT image reconstructions of the cervical spine were also generated.  COMPARISON:  CT head April 02, 2014; CT cervical spine October 06, 2012  FINDINGS: CT HEAD FINDINGS  There is mild diffuse atrophy. There is an epidural hematoma in the right temporal lobe region, acute, measuring 4.4 x 2.0 cm, causing localized mass effect and 4 mm of midline shift toward the left. There is also a degree of subdural hematoma, acute, slightly superior to the epidural hematoma with a maximum thickness of the subdural hematoma measuring 9 mm. There is no intra-axial hemorrhage. There is also a subdural hygroma on the right more superiorly causing mild mass effect. The maximum thickness of the  subdural hygroma on the right is 1.1 cm. There is a right parietal scalp hematoma. Intracranially, there are multiple lacunar infarcts throughout the centra semiovale bilaterally. No acute infarct apparent. The bony calvarium appears intact. There is chronic frontal sinus disease on the left, stable. Mastoid air cells are clear.  CT CERVICAL SPINE FINDINGS  There is no demonstrable fracture. There is rather minimal anterolisthesis of C5 on C6, stable and felt to be consistent with underlying spondylosis. There is no new spondylolisthesis. Prevertebral soft tissues and predental space regions are normal. There is moderately severe disc space narrowing at C6-7, stable. There is multilevel facet hypertrophy bilaterally, stable. No frank disc extrusion or stenosis. The thyroid appears inhomogeneous consistent with multinodular goiter. This appearance is stable.  IMPRESSION: CT head: Right-sided epidural hematoma, acute, causing mass effect and mild midline shift. Slightly more superiorly, there is a smaller acute subdural hematoma as well as a subdural hygroma slightly more superiorly with mild mass effect on the superior frontal lobe. Intracranially, there are  several prior lacunar infarcts in the centra semiovale bout laterally, stable. No acute infarct evident. No intra-axial hemorrhage evident. There is a sizable right parietal scalp hematoma. No fracture evident.  CT cervical spine: Multilevel arthropathy. Slight spondylolisthesis at C5-6 is stable and felt to be due to underlying spondylosis. No new spondylolisthesis. No fracture. Multinodular goiter, stable.  Critical Value/emergent results were called by telephone at the time of interpretation on 02/08/2015 at 9:12 am to Dr. Rolland Porter , who verbally acknowledged these results.   Electronically Signed   By: Bretta Bang III M.D.   On: 02/08/2015 09:13   Ct Cervical Spine Wo Contrast  02/08/2015   CLINICAL DATA:  Dementia.  Questionable fall  EXAM: CT HEAD  WITHOUT CONTRAST  CT CERVICAL SPINE WITHOUT CONTRAST  TECHNIQUE: Multidetector CT imaging of the head and cervical spine was performed following the standard protocol without intravenous contrast. Multiplanar CT image reconstructions of the cervical spine were also generated.  COMPARISON:  CT head April 02, 2014; CT cervical spine October 06, 2012  FINDINGS: CT HEAD FINDINGS  There is mild diffuse atrophy. There is an epidural hematoma in the right temporal lobe region, acute, measuring 4.4 x 2.0 cm, causing localized mass effect and 4 mm of midline shift toward the left. There is also a degree of subdural hematoma, acute, slightly superior to the epidural hematoma with a maximum thickness of the subdural hematoma measuring 9 mm. There is no intra-axial hemorrhage. There is also a subdural hygroma on the right more superiorly causing mild mass effect. The maximum thickness of the subdural hygroma on the right is 1.1 cm. There is a right parietal scalp hematoma. Intracranially, there are multiple lacunar infarcts throughout the centra semiovale bilaterally. No acute infarct apparent. The bony calvarium appears intact. There is chronic frontal sinus disease on the left, stable. Mastoid air cells are clear.  CT CERVICAL SPINE FINDINGS  There is no demonstrable fracture. There is rather minimal anterolisthesis of C5 on C6, stable and felt to be consistent with underlying spondylosis. There is no new spondylolisthesis. Prevertebral soft tissues and predental space regions are normal. There is moderately severe disc space narrowing at C6-7, stable. There is multilevel facet hypertrophy bilaterally, stable. No frank disc extrusion or stenosis. The thyroid appears inhomogeneous consistent with multinodular goiter. This appearance is stable.  IMPRESSION: CT head: Right-sided epidural hematoma, acute, causing mass effect and mild midline shift. Slightly more superiorly, there is a smaller acute subdural hematoma as well as a  subdural hygroma slightly more superiorly with mild mass effect on the superior frontal lobe. Intracranially, there are several prior lacunar infarcts in the centra semiovale bout laterally, stable. No acute infarct evident. No intra-axial hemorrhage evident. There is a sizable right parietal scalp hematoma. No fracture evident.  CT cervical spine: Multilevel arthropathy. Slight spondylolisthesis at C5-6 is stable and felt to be due to underlying spondylosis. No new spondylolisthesis. No fracture. Multinodular goiter, stable.  Critical Value/emergent results were called by telephone at the time of interpretation on 02/08/2015 at 9:12 am to Dr. Rolland Porter , who verbally acknowledged these results.   Electronically Signed   By: Bretta Bang III M.D.   On: 02/08/2015 09:13     Admission HPI: Original Author Ky Barban, MD  Ms. Bound is an 81 yr old woman, nursing home resident, with PMH of CVA, HTN, HLP, dementia with aphasia, and COPD, presenting for evaluation of fall at SNF. She sustained an unwitnessed fall the morning of her  presentation. She was found by her bed at 6AM with bleeding at the right scalp. She was on Plavix due to CVA in the past. Upon arrival to the [facility], CT head revealed acute right-sided epidural hematoma with mass effect and mild midline shift. Dr. Yetta Barre with Neurosurgery evaluated the patient in the ED and discussed the findings with the family. Ms. Menear family decided not to proceed with emergent craniotomy at this time. The family at this time has decided to make the patient DNR/DNI. The palliative care team has been consulted. The IMTS was called for her hospitalization for continued medical care.    Hospital Course by problem list:   Acute Right Subdural Hematoma with scalp laceration - Pt presented with unwitnessed fall at her assisted living facility found to have right temporoparietal scalp laceration requiring 3 staples and acute encephalopathy with left  sided paresis and paresthesias. CT head revealed acute right sided epidural hematoma causing mass effect and mild midline shift. CT cervical spine revealed no acute abnormality. Pt was at home on plavix 75 mg daily in setting of prior CVA. Pt was evaluated by neurosurgeon Dr. Yetta Barre who recommended craniotomy, however pt's family declined the procedure. Pt is DNR/DNI and declined palliative services. Pt's hemoglobin was stable between 9.2-10.5 during hospitalization with unclear baseline and PT/INR was normal. On second day of hospitalization, pt's mental status was back to baseline per family and her left sided paresis and paraesthesias resolved. Pt with no neurological deficits from baseline.  Repeat CT head on day of discharge revealed right sided subdural hematoma with stable midline shift and no change from CT head on admission. Pt was evaluated by speech therapy who recommended regular diet (thin) in setting of mild aspiration risk. Pt's plavix was discontinued and also instructed to not use aspirin. Pt's PCP to decide if and when to start AP therapy with aspirin for secondary stroke prevention once bleed resolves. Pt's anti-hypertensives were held during hospitalization due to soft blood pressures which are to be started once blood pressure increases to over 140/90. Neurosurgery did not recommend follow-up. Pt to be closely observed at assisted living facility for mental status and neurological decline and to have close supervision to prevent future falls. Pt needs staple removal in 7-10 days.   Hypertension -Pt with blood pressure range of 90/46 to 121/61.  Pt's home HCTZ 12.5 mg daily and verapamil 80 mg TID were held during hospitalization due to soft blood pressures. Pt to resume blood pressure medications once BP >140/90.  Dementia - Pt with history of dementia. Pt was initially altered in setting of subdural hematoma but returned to normal baseline mental status per family. She is on donepezil 5 mg  daily that was held during hospitalization and is to be resumed on discharge.    Vitamin D Deficiency - Pt at home on ergocalciferol 50 K U weekly with last vitamin D level unknown. Pt at home on ergocalciferol 50 K U weekly in addition to calcium-vitamin D. Pt needs 25-OH vitamin D level checked to determine if continues to need high dose supplementation. Pt to continue ergocalciferol 50K U weekly and start oscal 1250 mg daily.    Hyperlipidemia - Last lipid panel unknown. Pt at home on fish oil which is to be resumed on discharge.   DVT Prophylaxis - Pt received SCD's during hospitalization in setting of hemorrhage with no evidence of DVT.      Discharge Vitals:   BP 115/58 mmHg  Pulse 93  Temp(Src) 99.1 F (  37.3 C) (Oral)  Resp 14  Ht 5' (1.524 m)  Wt 112 lb 7 oz (51 kg)  BMI 21.96 kg/m2  SpO2 97%  Discharge Labs:  Results for orders placed or performed during the hospital encounter of 02/08/15 (from the past 24 hour(s))  CBC     Status: Abnormal   Collection Time: 02/09/15  6:18 AM  Result Value Ref Range   WBC 6.4 4.0 - 10.5 K/uL   RBC 2.89 (L) 3.87 - 5.11 MIL/uL   Hemoglobin 9.2 (L) 12.0 - 15.0 g/dL   HCT 16.1 (L) 09.6 - 04.5 %   MCV 93.8 78.0 - 100.0 fL   MCH 31.8 26.0 - 34.0 pg   MCHC 33.9 30.0 - 36.0 g/dL   RDW 40.9 81.1 - 91.4 %   Platelets 146 (L) 150 - 400 K/uL    Signed: Otis Brace, MD 02/09/2015, 2:33 PM    Services Ordered on Discharge:  Assisted Living Facility  Equipment Ordered on Discharge: None

## 2015-02-09 NOTE — Clinical Social Work Note (Addendum)
CSW was notified by RN that patient is from [facility] and ready to DC. CSW has facilitated DC back to the facility, but was unfortunately unable to complete full psycho social assessment with family. Family updated of situation at bedside. Family will transport patient with DC packet to facility. CSW spoke with [place] at [facility] who has cleared the patient for return. CSW signing off at this time.   Roddie Mc MSW, [place], [place], [phone]

## 2015-02-09 NOTE — Progress Notes (Addendum)
Subjective:  Pt seen and examined in AM. No acute events overnight. Pt alert and orientated x 3. She is able to talk and follow commands. She denies weakness, paraesthesias, blurry vision, or headahce. She was able to walk and is asking to eat. Her CT head today revealed stable subdural hematoma.    Objective: Vital signs in last 24 hours: Filed Vitals:   02/08/15 2000 02/08/15 2113 02/09/15 0606 02/09/15 1304  BP:  90/46 117/54 115/58  Pulse:  89 85 93  Temp:  98.7 F (37.1 C) 98.2 F (36.8 C) 99.1 F (37.3 C)  TempSrc:  Oral Oral Oral  Resp:  18 16 14   Height: 5' (1.524 m)     Weight: 113 lb 8.6 oz (51.5 kg) 112 lb 7 oz (51 kg)    SpO2:  98% 98% 97%   Weight change:   Intake/Output Summary (Last 24 hours) at 02/09/15 1330 Last data filed at 02/09/15 0857  Gross per 24 hour  Intake      0 ml  Output      0 ml  Net      0 ml   PHYSICAL EXAMINATION:   General: NAD Heart: Normal rate and rhythm  Lungs: Clear to auscultation b/l with no wheezing, ronchi, or rales  Abdomen: Soft, non-tender, non-distended, increased BS' Extremities: No edema Neuro: A & O x 3 with no CN deficit. Normal 5/5 muscle strength and normal sensation to light touch. Unable to assess gait.    Lab Results: Basic Metabolic Panel:  Recent Labs Lab 02/08/15 0931  NA 141  K 4.3  CL 106  CO2 26  GLUCOSE 139*  BUN 25*  CREATININE 1.02*  CALCIUM 9.2   CBC:  Recent Labs Lab 02/08/15 0931 02/09/15 0618  WBC 11.3* 6.4  NEUTROABS 9.3*  --   HGB 10.5* 9.2*  HCT 30.5* 27.1*  MCV 93.8 93.8  PLT 163 146*   Coagulation:  Recent Labs Lab 02/08/15 0931  LABPROT 14.3  INR 1.09    Micro Results: Recent Results (from the past 240 hour(s))  MRSA PCR Screening     Status: None   Collection Time: 02/08/15  1:47 PM  Result Value Ref Range Status   MRSA by PCR NEGATIVE NEGATIVE Final    Comment:        The GeneXpert MRSA Assay (FDA approved for NASAL specimens only), is one  component of a comprehensive MRSA colonization surveillance program. It is not intended to diagnose MRSA infection nor to guide or monitor treatment for MRSA infections.    Studies/Results: Ct Head Wo Contrast  02/09/2015   CLINICAL DATA:  Follow-up epidural hematoma. History of stroke, hypertension, dementia.  EXAM: CT HEAD WITHOUT CONTRAST  TECHNIQUE: Contiguous axial images were obtained from the base of the skull through the vertex without intravenous contrast.  COMPARISON:  CT head February 08, 2015  FINDINGS: Dense 19 mm transverse RIGHT frontotemporal lentiform extra-axial fluid collection, in addition to 2-3 mm RIGHT parietal subdural hematoma. 3 mm RIGHT to LEFT midline shift. Small amount of RIGHT frontal, bilateral occipital horn intraventricular blood products without hydrocephalus. Patchy to confluent supratentorial white matter hypodensities most consistent with chronic small vessel ischemic disease. No acute large vascular territory infarct.  Basal cisterns are patent. Moderate calcific atherosclerosis of the carotid siphons. Paranasal sinuses are well-aerated, frontal osteoma versus calcified mucosal retention cyst. The mastoid air cells are well aerated. No skull fracture. Moderate RIGHT scalp hematoma.  IMPRESSION: Similar lentiform RIGHT frontotemporal  extra-axial hematoma, likely subdural with 2-3 mm RIGHT parietal subdural component. No skull fracture.  Similar 3 mm RIGHT to LEFT midline shift. Small amount of intraventricular blood products without hydrocephalus.  Stable chronic changes including moderate to severe white matter changes most consistent with chronic small vessel ischemic disease.   Electronically Signed   By: Awilda Metro M.D.   On: 02/09/2015 06:25   Ct Head Wo Contrast  02/08/2015   CLINICAL DATA:  Dementia.  Questionable fall  EXAM: CT HEAD WITHOUT CONTRAST  CT CERVICAL SPINE WITHOUT CONTRAST  TECHNIQUE: Multidetector CT imaging of the head and cervical spine  was performed following the standard protocol without intravenous contrast. Multiplanar CT image reconstructions of the cervical spine were also generated.  COMPARISON:  CT head April 02, 2014; CT cervical spine October 06, 2012  FINDINGS: CT HEAD FINDINGS  There is mild diffuse atrophy. There is an epidural hematoma in the right temporal lobe region, acute, measuring 4.4 x 2.0 cm, causing localized mass effect and 4 mm of midline shift toward the left. There is also a degree of subdural hematoma, acute, slightly superior to the epidural hematoma with a maximum thickness of the subdural hematoma measuring 9 mm. There is no intra-axial hemorrhage. There is also a subdural hygroma on the right more superiorly causing mild mass effect. The maximum thickness of the subdural hygroma on the right is 1.1 cm. There is a right parietal scalp hematoma. Intracranially, there are multiple lacunar infarcts throughout the centra semiovale bilaterally. No acute infarct apparent. The bony calvarium appears intact. There is chronic frontal sinus disease on the left, stable. Mastoid air cells are clear.  CT CERVICAL SPINE FINDINGS  There is no demonstrable fracture. There is rather minimal anterolisthesis of C5 on C6, stable and felt to be consistent with underlying spondylosis. There is no new spondylolisthesis. Prevertebral soft tissues and predental space regions are normal. There is moderately severe disc space narrowing at C6-7, stable. There is multilevel facet hypertrophy bilaterally, stable. No frank disc extrusion or stenosis. The thyroid appears inhomogeneous consistent with multinodular goiter. This appearance is stable.  IMPRESSION: CT head: Right-sided epidural hematoma, acute, causing mass effect and mild midline shift. Slightly more superiorly, there is a smaller acute subdural hematoma as well as a subdural hygroma slightly more superiorly with mild mass effect on the superior frontal lobe. Intracranially, there are  several prior lacunar infarcts in the centra semiovale bout laterally, stable. No acute infarct evident. No intra-axial hemorrhage evident. There is a sizable right parietal scalp hematoma. No fracture evident.  CT cervical spine: Multilevel arthropathy. Slight spondylolisthesis at C5-6 is stable and felt to be due to underlying spondylosis. No new spondylolisthesis. No fracture. Multinodular goiter, stable.  Critical Value/emergent results were called by telephone at the time of interpretation on 02/08/2015 at 9:12 am to Dr. Rolland Porter , who verbally acknowledged these results.   Electronically Signed   By: Bretta Bang III M.D.   On: 02/08/2015 09:13   Ct Cervical Spine Wo Contrast  02/08/2015   CLINICAL DATA:  Dementia.  Questionable fall  EXAM: CT HEAD WITHOUT CONTRAST  CT CERVICAL SPINE WITHOUT CONTRAST  TECHNIQUE: Multidetector CT imaging of the head and cervical spine was performed following the standard protocol without intravenous contrast. Multiplanar CT image reconstructions of the cervical spine were also generated.  COMPARISON:  CT head April 02, 2014; CT cervical spine October 06, 2012  FINDINGS: CT HEAD FINDINGS  There is mild diffuse atrophy. There is an  epidural hematoma in the right temporal lobe region, acute, measuring 4.4 x 2.0 cm, causing localized mass effect and 4 mm of midline shift toward the left. There is also a degree of subdural hematoma, acute, slightly superior to the epidural hematoma with a maximum thickness of the subdural hematoma measuring 9 mm. There is no intra-axial hemorrhage. There is also a subdural hygroma on the right more superiorly causing mild mass effect. The maximum thickness of the subdural hygroma on the right is 1.1 cm. There is a right parietal scalp hematoma. Intracranially, there are multiple lacunar infarcts throughout the centra semiovale bilaterally. No acute infarct apparent. The bony calvarium appears intact. There is chronic frontal sinus disease  on the left, stable. Mastoid air cells are clear.  CT CERVICAL SPINE FINDINGS  There is no demonstrable fracture. There is rather minimal anterolisthesis of C5 on C6, stable and felt to be consistent with underlying spondylosis. There is no new spondylolisthesis. Prevertebral soft tissues and predental space regions are normal. There is moderately severe disc space narrowing at C6-7, stable. There is multilevel facet hypertrophy bilaterally, stable. No frank disc extrusion or stenosis. The thyroid appears inhomogeneous consistent with multinodular goiter. This appearance is stable.  IMPRESSION: CT head: Right-sided epidural hematoma, acute, causing mass effect and mild midline shift. Slightly more superiorly, there is a smaller acute subdural hematoma as well as a subdural hygroma slightly more superiorly with mild mass effect on the superior frontal lobe. Intracranially, there are several prior lacunar infarcts in the centra semiovale bout laterally, stable. No acute infarct evident. No intra-axial hemorrhage evident. There is a sizable right parietal scalp hematoma. No fracture evident.  CT cervical spine: Multilevel arthropathy. Slight spondylolisthesis at C5-6 is stable and felt to be due to underlying spondylosis. No new spondylolisthesis. No fracture. Multinodular goiter, stable.  Critical Value/emergent results were called by telephone at the time of interpretation on 02/08/2015 at 9:12 am to Dr. Rolland Porter , who verbally acknowledged these results.   Electronically Signed   By: Bretta Bang III M.D.   On: 02/08/2015 09:13   Medications: I have reviewed the patient's current medications. Scheduled Meds: Continuous Infusions: PRN Meds:. Assessment/Plan:  Acute Right Subdural Hematoma - Pt with improved mental status with resolved left sided paresis and no current neurological deficits. Hg stable at 9.2. Repeat CT head this AM with stable subdural hematoma with stable midline shift and no change  from yesterday's CT head. Pt's family declined craniotomy.  -Speech evaluation completed, recommend regular diet (thin), mild aspiration risk  -Awaiting PT and OT consults  -Discontinue plavix 75 mg daily  -No aspirin for now, PCP to determine if needs to be restarted after resolution of bleed for secondary stroke prevention -Will consult neurosurgery for recommendations  Hypertension -  Currently with soft blood pressures.  -Hold HCTZ 12.5 mg daily and verapamil 80 mg TID, resume once BP >140/90  Dementia - Currently back to baseline mental status per family.  -Resume donepezil 5 mg daily   Vitamin D Deficiency - Pt at home on ergocalciferol. Last vitamin D level unknown.  -Resume ergocalciferol 50 K U weekly, discontinue if levels improved to >20  -Resume calcium-vitamin D  -Repeat vitamin D level   Hyperlipidemia - Last lipid panel unknown. -Resume home fish oil 1 g BID   Diet: Regular DVT PPx: SCD's Code: DNR/DNI   Dispo: Disposition is deferred at this time, awaiting improvement of current medical problems.  Anticipated discharge is today.   The patient does  have a current PCP Kari Baars, MD) and does need an [facility] hospital follow-up appointment after discharge.  The patient does not have transportation limitations that hinder transportation to clinic appointments.  .Services Needed at time of discharge: Y = Yes, [place] = No PT:   OT:   RN:   Equipment:   Other:     LOS: 1 day   Otis Brace, MD 02/09/2015, 1:30 PM

## 2015-02-09 NOTE — Evaluation (Signed)
Physical Therapy Evaluation Patient Details Name: Brittany Powers MRN: [id number] DOB: 06-20-1931 Today's Date: 02/09/2015   History of Present Illness  Patient is a 81 y/o female admitted s/p fall from dementia unit at [facility] presents with right sided epidural hematoma with midline shift. PMH includes dementia, HTN, CVA.  Clinical Impression  Patient presents with baseline cognitive deficits with hx of dementia, poor safety awareness/judgment and balance deficits impacting mobility. Per family, pt living in a dementia unit and has assist for bathing but able to dress self. Pt high falls risk. Will need to see if facility can provide close min guard for ambulation initially (~1-2 weeks) until strength/mobility improve. If not, pt may require higher level of assist ([facility]). Will continue to follow to maximize independence and mobility.      Follow Up Recommendations Supervision for mobility/OOB;SNF    Equipment Recommendations  None recommended by PT    Recommendations for Other Services       Precautions / Restrictions Precautions Precautions: Fall Restrictions Weight Bearing Restrictions: No      Mobility  Bed Mobility Overal bed mobility: Modified Independent                Transfers Overall transfer level: Needs assistance Equipment used: None Transfers: Sit to/from Stand Sit to Stand: Min guard         General transfer comment: Min guard for safety. USe of rails to stand from toilet. Poor safety awareness.   Ambulation/Gait Ambulation/Gait assistance: Min guard Ambulation Distance (Feet): 150 Feet Assistive device: None (rail in hallway) Gait Pattern/deviations: Step-through pattern;Drifts right/left;Narrow base of support   Gait velocity interpretation: Below normal speed for age/gender General Gait Details: Slow, unsteady gait esp with head turns-drifting and stumbling noted but pt able to catch self. Reaching for rail for support. Close Min  guard due to poor safety awareness.  Stairs            Wheelchair Mobility    Modified Rankin (Stroke Patients Only)       Balance Overall balance assessment: History of Falls;Needs assistance Sitting-balance support: Feet supported;No upper extremity supported Sitting balance-Leahy Scale: Fair Sitting balance - Comments: Pt reaching towards floor to pick up tissues - provided min guard due to questionable balance.    Standing balance support: During functional activity Standing balance-Leahy Scale: Fair Standing balance comment: Able to perform pericare with UE support.                              Pertinent Vitals/Pain Pain Assessment: No/denies pain    Home Living Family/patient expects to be discharged to:: Assisted living (dementia unit of [place]               Home Equipment: None      Prior Function Level of Independence: Needs assistance   Gait / Transfers Assistance Needed: Pt reports pt is ambulatory at baseline but uses rail in hallway for support. Goes to dining hall for meals.  ADL's / Homemaking Assistance Needed: Assist with bathing but able to dress self.         Hand Dominance        Extremity/Trunk Assessment   Upper Extremity Assessment: Defer to OT evaluation           Lower Extremity Assessment: Generalized weakness         Communication   Communication: Expressive difficulties (Minimially verbal.)  Cognition Arousal/Alertness: Awake/alert Behavior During  Therapy: WFL for tasks assessed/performed Overall Cognitive Status: History of cognitive impairments - at baseline (Family reports pt is at cognitive baseline w/ hx of dementia.)                      General Comments General comments (skin integrity, edema, etc.): Family present to provide PLOF/history. Pt incontinent of stool during ambulation. Helped with pericare and changed gown.    Exercises        Assessment/Plan    PT  Assessment Patient needs continued PT services  PT Diagnosis Difficulty walking;Generalized weakness   PT Problem List Decreased strength;Decreased balance;Decreased cognition;Decreased safety awareness  PT Treatment Interventions Gait training;Functional mobility training;Therapeutic activities;Therapeutic exercise;Patient/family education;Balance training   PT Goals (Current goals can be found in the Care Plan section) Acute Rehab PT Goals PT Goal Formulation: Patient unable to participate in goal setting Time For Goal Achievement: 02/23/15 Potential to Achieve Goals: Fair    Frequency Min 2X/week   Barriers to discharge Decreased caregiver support Not sure how much support dementia unit can provide     Co-evaluation               End of Session Equipment Utilized During Treatment: Gait belt Activity Tolerance: Patient tolerated treatment well Patient left: in bed;with call bell/phone within reach;with bed alarm set;with family/visitor present Nurse Communication: Mobility status         Time: 9604-5409 PT Time Calculation (min) (ACUTE ONLY): 21 min   Charges:   PT Evaluation $Initial PT Evaluation Tier I: 1 Procedure     PT G Codes:        Elide Stalzer A Cici Rodriges 02/09/2015, 3:39 PM Mylo Red, PT, DPT [phone]

## 2015-02-09 NOTE — Progress Notes (Signed)
Brittany Powers to be D/C'd [facility] per MD order.  Discussed with the patient and all questions fully answered.  VSS. Staples clean dry and intact back of head. IV catheter discontinued intact. Site without signs and symptoms of complications. Dressing and pressure applied.  An After Visit Summary was printed and given to the patient. Patient received prescription.  Patient escorted via WC, and D/C to assisted living via family.   [place], Unknown Flannigan C 02/09/2015 4:28 PM

## 2015-02-09 NOTE — Clinical Social Work Note (Signed)
CSW notes that patient is from [facility] ALF. CSW will wait for PT/OT evaluations to determine if the patient is manageable at this level of care.   Roddie Mc MSW, [place], [place], [phone]

## 2015-02-09 NOTE — Evaluation (Signed)
Clinical/Bedside Swallow Evaluation Patient Details  Name: Brittany Powers MRN: [id number] Date of Birth: 1931-08-17  Today's Date: 02/09/2015 Time: SLP Start Time (ACUTE ONLY): 1000 SLP Stop Time (ACUTE ONLY): 1015 SLP Time Calculation (min) (ACUTE ONLY): 15 min  Past Medical History:  Past Medical History  Diagnosis Date  . CVA (cerebral infarction)   . Hypertension   . High cholesterol   . Dementia   . Incontinent of urine    Past Surgical History: History reviewed. No pertinent past surgical history. HPI:  Pt is an 81 y.o. female SNF resident with PMH of CVA, HTN, HLP, dementia with aphasia, and COPD, presenting for evaluation of fall at SNF. She sustained an unwitnessed fall the morning of her presentation. She was found by her bed at 6AM with bleeding at the right scalp. CT head revealed acute right-sided epidural hematoma with mass effect and mild midline shift. Dr. Yetta Barre with Neurosurgery evaluated the patient in the ED and discussed the findings with the family. Brittany Powers family decided not to proceed with emergent craniotomy at this time. Palliative care has been consulted. Pt failed RN stroke swallow screen- did not cough on command. Bedside swallow eval ordered to assess swallow function.    Assessment / Plan / Recommendation Clinical Impression  Pt demonstrated an immediate cough following 2 large sips by straw- suspect pt with mildly delayed swallow initiation. Cued pt to take smaller sips- pt was able to follow cues with no s/s of aspiration as a result. Family reported pt did not have dysphagia following CVA; no mention of PNA in chart. Given these findings, aspiration risk appears mild at this time. Recommend initiating regular diet, thin liquids, meds whole with liquid, provide intermittent cueing for pt to take small bites/ sips. SLP will sign off at this time- please re-consult if needs arise.    Aspiration Risk  Mild    Diet Recommendation Age appropriate regular  solids;Thin   Medication Administration: Whole meds with liquid Compensations: Slow rate;Small sips/bites    Other  Recommendations Oral Care Recommendations: Oral care BID   Follow Up Recommendations       Frequency and Duration        Pertinent Vitals/Pain none    SLP Swallow Goals     Swallow Study Prior Functional Status       General Other Pertinent Information: Pt is an 81 y.o. female SNF resident with PMH of CVA, HTN, HLP, dementia with aphasia, and COPD, presenting for evaluation of fall at SNF. She sustained an unwitnessed fall the morning of her presentation. She was found by her bed at 6AM with bleeding at the right scalp. CT head revealed acute right-sided epidural hematoma with mass effect and mild midline shift. Dr. Yetta Barre with Neurosurgery evaluated the patient in the ED and discussed the findings with the family. Brittany Powers family decided not to proceed with emergent craniotomy at this time. Palliative care has been consulted. Pt failed RN stroke swallow screen- did not cough on command. Bedside swallow eval ordered to assess swallow function.  Type of Study: Bedside swallow evaluation Diet Prior to this Study: NPO Temperature Spikes Noted: No Respiratory Status: Room air History of Recent Intubation: No Behavior/Cognition: Alert;Cooperative;Pleasant mood;Requires cueing Oral Cavity - Dentition: Edentulous Self-Feeding Abilities: Able to feed self Patient Positioning: Upright in chair/Tumbleform Baseline Vocal Quality: Normal Volitional Cough: Strong Volitional Swallow: Unable to elicit    Oral/Motor/Sensory Function Overall Oral Motor/Sensory Function: Appears within functional limits for tasks assessed Labial ROM: Within  Functional Limits Labial Symmetry: Within Functional Limits Labial Strength: Within Functional Limits Lingual ROM: Within Functional Limits Lingual Symmetry: Within Functional Limits Lingual Strength: Within Functional Limits   Ice  Chips Ice chips: Not tested   Thin Liquid Thin Liquid: Impaired Presentation: Cup;Straw Pharyngeal  Phase Impairments: Suspected delayed Swallow;Cough - Immediate;Other (comments) (cough following large straw sips)    Nectar Thick Nectar Thick Liquid: Not tested   Honey Thick Honey Thick Liquid: Not tested   Puree Puree: Within functional limits Presentation: Self Fed;Spoon   Solid   GO    Solid: Within functional limits Presentation: Self Fed       [place], Amy K, MA, CCC-SLP 02/09/2015,10:22 AM

## 2015-02-09 NOTE — Progress Notes (Signed)
Report given to [employer], Nurse at [facility]

## 2015-02-11 DIAGNOSIS — E785 Hyperlipidemia, unspecified: Secondary | ICD-10-CM | POA: Diagnosis not present

## 2015-02-11 DIAGNOSIS — S065X0D Traumatic subdural hemorrhage without loss of consciousness, subsequent encounter: Secondary | ICD-10-CM | POA: Diagnosis not present

## 2015-02-11 DIAGNOSIS — Z8673 Personal history of transient ischemic attack (TIA), and cerebral infarction without residual deficits: Secondary | ICD-10-CM | POA: Diagnosis not present

## 2015-02-11 DIAGNOSIS — E559 Vitamin D deficiency, unspecified: Secondary | ICD-10-CM | POA: Diagnosis not present

## 2015-02-11 DIAGNOSIS — S0101XD Laceration without foreign body of scalp, subsequent encounter: Secondary | ICD-10-CM | POA: Diagnosis not present

## 2015-02-11 DIAGNOSIS — F0390 Unspecified dementia without behavioral disturbance: Secondary | ICD-10-CM | POA: Diagnosis not present

## 2015-02-11 DIAGNOSIS — I10 Essential (primary) hypertension: Secondary | ICD-10-CM | POA: Diagnosis not present

## 2015-02-11 DIAGNOSIS — R32 Unspecified urinary incontinence: Secondary | ICD-10-CM | POA: Diagnosis not present

## 2015-02-13 DIAGNOSIS — R32 Unspecified urinary incontinence: Secondary | ICD-10-CM | POA: Diagnosis not present

## 2015-02-13 DIAGNOSIS — E559 Vitamin D deficiency, unspecified: Secondary | ICD-10-CM | POA: Diagnosis not present

## 2015-02-13 DIAGNOSIS — I10 Essential (primary) hypertension: Secondary | ICD-10-CM | POA: Diagnosis not present

## 2015-02-13 DIAGNOSIS — F0390 Unspecified dementia without behavioral disturbance: Secondary | ICD-10-CM | POA: Diagnosis not present

## 2015-02-13 DIAGNOSIS — S065X0D Traumatic subdural hemorrhage without loss of consciousness, subsequent encounter: Secondary | ICD-10-CM | POA: Diagnosis not present

## 2015-02-13 DIAGNOSIS — Z8673 Personal history of transient ischemic attack (TIA), and cerebral infarction without residual deficits: Secondary | ICD-10-CM | POA: Diagnosis not present

## 2015-02-13 DIAGNOSIS — E785 Hyperlipidemia, unspecified: Secondary | ICD-10-CM | POA: Diagnosis not present

## 2015-02-13 DIAGNOSIS — S0101XD Laceration without foreign body of scalp, subsequent encounter: Secondary | ICD-10-CM | POA: Diagnosis not present

## 2015-02-22 DIAGNOSIS — S0101XD Laceration without foreign body of scalp, subsequent encounter: Secondary | ICD-10-CM | POA: Diagnosis not present

## 2015-02-22 DIAGNOSIS — I10 Essential (primary) hypertension: Secondary | ICD-10-CM | POA: Diagnosis not present

## 2015-02-22 DIAGNOSIS — Z8673 Personal history of transient ischemic attack (TIA), and cerebral infarction without residual deficits: Secondary | ICD-10-CM | POA: Diagnosis not present

## 2015-02-22 DIAGNOSIS — E559 Vitamin D deficiency, unspecified: Secondary | ICD-10-CM | POA: Diagnosis not present

## 2015-02-22 DIAGNOSIS — R32 Unspecified urinary incontinence: Secondary | ICD-10-CM | POA: Diagnosis not present

## 2015-02-22 DIAGNOSIS — S065X0D Traumatic subdural hemorrhage without loss of consciousness, subsequent encounter: Secondary | ICD-10-CM | POA: Diagnosis not present

## 2015-02-22 DIAGNOSIS — F0390 Unspecified dementia without behavioral disturbance: Secondary | ICD-10-CM | POA: Diagnosis not present

## 2015-02-22 DIAGNOSIS — E785 Hyperlipidemia, unspecified: Secondary | ICD-10-CM | POA: Diagnosis not present

## 2015-02-24 DIAGNOSIS — E559 Vitamin D deficiency, unspecified: Secondary | ICD-10-CM | POA: Diagnosis not present

## 2015-02-24 DIAGNOSIS — E785 Hyperlipidemia, unspecified: Secondary | ICD-10-CM | POA: Diagnosis not present

## 2015-02-24 DIAGNOSIS — S0101XD Laceration without foreign body of scalp, subsequent encounter: Secondary | ICD-10-CM | POA: Diagnosis not present

## 2015-02-24 DIAGNOSIS — F0390 Unspecified dementia without behavioral disturbance: Secondary | ICD-10-CM | POA: Diagnosis not present

## 2015-02-24 DIAGNOSIS — Z8673 Personal history of transient ischemic attack (TIA), and cerebral infarction without residual deficits: Secondary | ICD-10-CM | POA: Diagnosis not present

## 2015-02-24 DIAGNOSIS — S065X0D Traumatic subdural hemorrhage without loss of consciousness, subsequent encounter: Secondary | ICD-10-CM | POA: Diagnosis not present

## 2015-02-24 DIAGNOSIS — I10 Essential (primary) hypertension: Secondary | ICD-10-CM | POA: Diagnosis not present

## 2015-02-24 DIAGNOSIS — R32 Unspecified urinary incontinence: Secondary | ICD-10-CM | POA: Diagnosis not present

## 2015-02-26 DIAGNOSIS — I10 Essential (primary) hypertension: Secondary | ICD-10-CM | POA: Diagnosis not present

## 2015-02-26 DIAGNOSIS — E785 Hyperlipidemia, unspecified: Secondary | ICD-10-CM | POA: Diagnosis not present

## 2015-02-26 DIAGNOSIS — S065X0D Traumatic subdural hemorrhage without loss of consciousness, subsequent encounter: Secondary | ICD-10-CM | POA: Diagnosis not present

## 2015-02-26 DIAGNOSIS — E559 Vitamin D deficiency, unspecified: Secondary | ICD-10-CM | POA: Diagnosis not present

## 2015-02-26 DIAGNOSIS — S0101XD Laceration without foreign body of scalp, subsequent encounter: Secondary | ICD-10-CM | POA: Diagnosis not present

## 2015-02-26 DIAGNOSIS — F0390 Unspecified dementia without behavioral disturbance: Secondary | ICD-10-CM | POA: Diagnosis not present

## 2015-02-26 DIAGNOSIS — Z8673 Personal history of transient ischemic attack (TIA), and cerebral infarction without residual deficits: Secondary | ICD-10-CM | POA: Diagnosis not present

## 2015-02-26 DIAGNOSIS — R32 Unspecified urinary incontinence: Secondary | ICD-10-CM | POA: Diagnosis not present

## 2015-02-26 NOTE — Progress Notes (Signed)
Patient ID: Brittany Powers, female   DOB: 03/21/1931, 81 y.o.   MRN: [id number]  Brittany Powers living [place]     Allergies  Allergen Reactions  . Bee Venom Itching and Swelling       Chief Complaint  Patient presents with  . Discharge Note    HPI:  She is being discharged to assisted living facility [facility]. She will need home health ofr pt/ot to gait and adl retraining. She will not need dme. She will need her prescriptions written and will need to follow up with the medical provider at the facility.    Past Medical History  Diagnosis Date  . CVA (cerebral infarction)   . Hypertension   . High cholesterol   . Dementia   . Incontinent of urine     No past surgical history on file.  VITAL SIGNS BP 150/80 mmHg  Pulse 70  Wt 116 lb (52.617 kg)   Outpatient Encounter Prescriptions as of 12/30/2014  Medication Sig   ASPIRIN EC 81 MG TABLET    Take 81 mg by mouth every evening.  CALCIUM CARBONATE-VITAMIN D (CALCIUM 500 + D PO)    Take 1 tablet by mouth 2 (two) times daily.  CLOPIDOGREL (PLAVIX) 75 MG TABLET    Take 75 mg by mouth every morning.  DONEPEZIL (ARICEPT) 5 MG TABLET    Take 5 mg by mouth daily.  FISH OIL-OMEGA-3 FATTY ACIDS 1000 MG CAPSULE    Take 1 g by mouth 2 (two) times daily.  HYDROCHLOROTHIAZIDE (HYDRODIURIL) 25 MG TABLET    Take 12.5 mg by mouth every morning.  NAPROXEN SODIUM (ALEVE) 220 MG TABLET    Take 220 mg by mouth 2 (two) times daily.  VERAPAMIL (CALAN) 80 MG TABLET    Take 80 mg by mouth 3 (three) times daily. Takes last dose two hours prior to evening meal      SIGNIFICANT DIAGNOSTIC EXAMS  LABS REVIEWED:  12-28-14; wbc 9.5; hgb 15.8; hct 46.3; mcv 93.3; plt 172; glucose 135; bun 21; creat 0.74; k+3.7; na++141; liver normal albumin 3.9    Review of Systems  Constitutional: Negative for malaise/fatigue.  Respiratory: Negative for cough and shortness of breath.   Cardiovascular: Negative for chest pain and leg swelling.    Gastrointestinal: Negative for abdominal pain.  Musculoskeletal: Negative for joint pain.  Skin: Negative.   Psychiatric/Behavioral: The patient is not nervous/anxious.      Physical Exam  Constitutional: No distress.  Neck: Neck supple. No JVD present.  Cardiovascular: Normal rate, regular rhythm and intact distal pulses.   Respiratory: Effort normal and breath sounds normal. No respiratory distress.  GI: Soft. Bowel sounds are normal. She exhibits no distension.  Musculoskeletal: She exhibits no edema.  Is able to move extremities   Neurological: She is alert.  Skin: Skin is warm and dry. She is not diaphoretic.       ASSESSMENT/ PLAN:  Will discharge to [facility]. She will need home health for pt/ot to evaluate and treat as indicated for gait and adl retraining. Her prescriptions have been written for a 30 day supply of medications. She will see the medical provider at the facility for follow up.    Time spent with patient 40 minutes.      Brittany Innocenteborah Dru Laurel NP [facility]  Contact [phone] Monday through Friday 8am- 5pm  After hours call [phone]

## 2015-02-28 DIAGNOSIS — S0101XD Laceration without foreign body of scalp, subsequent encounter: Secondary | ICD-10-CM | POA: Diagnosis not present

## 2015-02-28 DIAGNOSIS — E785 Hyperlipidemia, unspecified: Secondary | ICD-10-CM | POA: Diagnosis not present

## 2015-02-28 DIAGNOSIS — F0390 Unspecified dementia without behavioral disturbance: Secondary | ICD-10-CM | POA: Diagnosis not present

## 2015-02-28 DIAGNOSIS — S065X0D Traumatic subdural hemorrhage without loss of consciousness, subsequent encounter: Secondary | ICD-10-CM | POA: Diagnosis not present

## 2015-02-28 DIAGNOSIS — E559 Vitamin D deficiency, unspecified: Secondary | ICD-10-CM | POA: Diagnosis not present

## 2015-02-28 DIAGNOSIS — I10 Essential (primary) hypertension: Secondary | ICD-10-CM | POA: Diagnosis not present

## 2015-02-28 DIAGNOSIS — Z8673 Personal history of transient ischemic attack (TIA), and cerebral infarction without residual deficits: Secondary | ICD-10-CM | POA: Diagnosis not present

## 2015-02-28 DIAGNOSIS — R32 Unspecified urinary incontinence: Secondary | ICD-10-CM | POA: Diagnosis not present

## 2015-03-03 DIAGNOSIS — F0390 Unspecified dementia without behavioral disturbance: Secondary | ICD-10-CM | POA: Diagnosis not present

## 2015-03-03 DIAGNOSIS — I10 Essential (primary) hypertension: Secondary | ICD-10-CM | POA: Diagnosis not present

## 2015-03-03 DIAGNOSIS — E559 Vitamin D deficiency, unspecified: Secondary | ICD-10-CM | POA: Diagnosis not present

## 2015-03-03 DIAGNOSIS — Z8673 Personal history of transient ischemic attack (TIA), and cerebral infarction without residual deficits: Secondary | ICD-10-CM | POA: Diagnosis not present

## 2015-03-03 DIAGNOSIS — E785 Hyperlipidemia, unspecified: Secondary | ICD-10-CM | POA: Diagnosis not present

## 2015-03-03 DIAGNOSIS — S065X0D Traumatic subdural hemorrhage without loss of consciousness, subsequent encounter: Secondary | ICD-10-CM | POA: Diagnosis not present

## 2015-03-03 DIAGNOSIS — S0101XD Laceration without foreign body of scalp, subsequent encounter: Secondary | ICD-10-CM | POA: Diagnosis not present

## 2015-03-03 DIAGNOSIS — R32 Unspecified urinary incontinence: Secondary | ICD-10-CM | POA: Diagnosis not present

## 2015-03-07 DIAGNOSIS — Z8673 Personal history of transient ischemic attack (TIA), and cerebral infarction without residual deficits: Secondary | ICD-10-CM | POA: Diagnosis not present

## 2015-03-07 DIAGNOSIS — E559 Vitamin D deficiency, unspecified: Secondary | ICD-10-CM | POA: Diagnosis not present

## 2015-03-07 DIAGNOSIS — E785 Hyperlipidemia, unspecified: Secondary | ICD-10-CM | POA: Diagnosis not present

## 2015-03-07 DIAGNOSIS — F0390 Unspecified dementia without behavioral disturbance: Secondary | ICD-10-CM | POA: Diagnosis not present

## 2015-03-07 DIAGNOSIS — R32 Unspecified urinary incontinence: Secondary | ICD-10-CM | POA: Diagnosis not present

## 2015-03-07 DIAGNOSIS — I10 Essential (primary) hypertension: Secondary | ICD-10-CM | POA: Diagnosis not present

## 2015-03-07 DIAGNOSIS — S065X0D Traumatic subdural hemorrhage without loss of consciousness, subsequent encounter: Secondary | ICD-10-CM | POA: Diagnosis not present

## 2015-03-07 DIAGNOSIS — S0101XD Laceration without foreign body of scalp, subsequent encounter: Secondary | ICD-10-CM | POA: Diagnosis not present

## 2015-03-09 DIAGNOSIS — E785 Hyperlipidemia, unspecified: Secondary | ICD-10-CM | POA: Diagnosis not present

## 2015-03-09 DIAGNOSIS — R32 Unspecified urinary incontinence: Secondary | ICD-10-CM | POA: Diagnosis not present

## 2015-03-09 DIAGNOSIS — I10 Essential (primary) hypertension: Secondary | ICD-10-CM | POA: Diagnosis not present

## 2015-03-09 DIAGNOSIS — E559 Vitamin D deficiency, unspecified: Secondary | ICD-10-CM | POA: Diagnosis not present

## 2015-03-09 DIAGNOSIS — Z8673 Personal history of transient ischemic attack (TIA), and cerebral infarction without residual deficits: Secondary | ICD-10-CM | POA: Diagnosis not present

## 2015-03-09 DIAGNOSIS — F0390 Unspecified dementia without behavioral disturbance: Secondary | ICD-10-CM | POA: Diagnosis not present

## 2015-03-09 DIAGNOSIS — S0101XD Laceration without foreign body of scalp, subsequent encounter: Secondary | ICD-10-CM | POA: Diagnosis not present

## 2015-03-09 DIAGNOSIS — S065X0D Traumatic subdural hemorrhage without loss of consciousness, subsequent encounter: Secondary | ICD-10-CM | POA: Diagnosis not present

## 2015-03-10 DIAGNOSIS — E559 Vitamin D deficiency, unspecified: Secondary | ICD-10-CM | POA: Diagnosis not present

## 2015-03-10 DIAGNOSIS — F0390 Unspecified dementia without behavioral disturbance: Secondary | ICD-10-CM | POA: Diagnosis not present

## 2015-03-10 DIAGNOSIS — S0101XD Laceration without foreign body of scalp, subsequent encounter: Secondary | ICD-10-CM | POA: Diagnosis not present

## 2015-03-10 DIAGNOSIS — Z8673 Personal history of transient ischemic attack (TIA), and cerebral infarction without residual deficits: Secondary | ICD-10-CM | POA: Diagnosis not present

## 2015-03-10 DIAGNOSIS — I10 Essential (primary) hypertension: Secondary | ICD-10-CM | POA: Diagnosis not present

## 2015-03-10 DIAGNOSIS — E785 Hyperlipidemia, unspecified: Secondary | ICD-10-CM | POA: Diagnosis not present

## 2015-03-10 DIAGNOSIS — S065X0D Traumatic subdural hemorrhage without loss of consciousness, subsequent encounter: Secondary | ICD-10-CM | POA: Diagnosis not present

## 2015-03-10 DIAGNOSIS — R32 Unspecified urinary incontinence: Secondary | ICD-10-CM | POA: Diagnosis not present

## 2015-03-14 DIAGNOSIS — R32 Unspecified urinary incontinence: Secondary | ICD-10-CM | POA: Diagnosis not present

## 2015-03-14 DIAGNOSIS — S065X0D Traumatic subdural hemorrhage without loss of consciousness, subsequent encounter: Secondary | ICD-10-CM | POA: Diagnosis not present

## 2015-03-14 DIAGNOSIS — E559 Vitamin D deficiency, unspecified: Secondary | ICD-10-CM | POA: Diagnosis not present

## 2015-03-14 DIAGNOSIS — E785 Hyperlipidemia, unspecified: Secondary | ICD-10-CM | POA: Diagnosis not present

## 2015-03-14 DIAGNOSIS — R531 Weakness: Secondary | ICD-10-CM | POA: Diagnosis not present

## 2015-03-14 DIAGNOSIS — I10 Essential (primary) hypertension: Secondary | ICD-10-CM | POA: Diagnosis not present

## 2015-03-14 DIAGNOSIS — Z8673 Personal history of transient ischemic attack (TIA), and cerebral infarction without residual deficits: Secondary | ICD-10-CM | POA: Diagnosis not present

## 2015-03-14 DIAGNOSIS — Z79899 Other long term (current) drug therapy: Secondary | ICD-10-CM | POA: Diagnosis not present

## 2015-03-14 DIAGNOSIS — F0390 Unspecified dementia without behavioral disturbance: Secondary | ICD-10-CM | POA: Diagnosis not present

## 2015-03-14 DIAGNOSIS — S0101XD Laceration without foreign body of scalp, subsequent encounter: Secondary | ICD-10-CM | POA: Diagnosis not present

## 2015-03-15 DIAGNOSIS — R32 Unspecified urinary incontinence: Secondary | ICD-10-CM | POA: Diagnosis not present

## 2015-03-15 DIAGNOSIS — F0390 Unspecified dementia without behavioral disturbance: Secondary | ICD-10-CM | POA: Diagnosis not present

## 2015-03-15 DIAGNOSIS — Z8673 Personal history of transient ischemic attack (TIA), and cerebral infarction without residual deficits: Secondary | ICD-10-CM | POA: Diagnosis not present

## 2015-03-15 DIAGNOSIS — S065X0D Traumatic subdural hemorrhage without loss of consciousness, subsequent encounter: Secondary | ICD-10-CM | POA: Diagnosis not present

## 2015-03-15 DIAGNOSIS — S0101XD Laceration without foreign body of scalp, subsequent encounter: Secondary | ICD-10-CM | POA: Diagnosis not present

## 2015-03-15 DIAGNOSIS — E785 Hyperlipidemia, unspecified: Secondary | ICD-10-CM | POA: Diagnosis not present

## 2015-03-15 DIAGNOSIS — I10 Essential (primary) hypertension: Secondary | ICD-10-CM | POA: Diagnosis not present

## 2015-03-15 DIAGNOSIS — E559 Vitamin D deficiency, unspecified: Secondary | ICD-10-CM | POA: Diagnosis not present

## 2015-03-20 DIAGNOSIS — I10 Essential (primary) hypertension: Secondary | ICD-10-CM | POA: Diagnosis not present

## 2015-03-20 DIAGNOSIS — E559 Vitamin D deficiency, unspecified: Secondary | ICD-10-CM | POA: Diagnosis not present

## 2015-03-20 DIAGNOSIS — Z8673 Personal history of transient ischemic attack (TIA), and cerebral infarction without residual deficits: Secondary | ICD-10-CM | POA: Diagnosis not present

## 2015-03-20 DIAGNOSIS — E785 Hyperlipidemia, unspecified: Secondary | ICD-10-CM | POA: Diagnosis not present

## 2015-03-20 DIAGNOSIS — R32 Unspecified urinary incontinence: Secondary | ICD-10-CM | POA: Diagnosis not present

## 2015-03-20 DIAGNOSIS — S065X0D Traumatic subdural hemorrhage without loss of consciousness, subsequent encounter: Secondary | ICD-10-CM | POA: Diagnosis not present

## 2015-03-20 DIAGNOSIS — S0101XD Laceration without foreign body of scalp, subsequent encounter: Secondary | ICD-10-CM | POA: Diagnosis not present

## 2015-03-20 DIAGNOSIS — F0390 Unspecified dementia without behavioral disturbance: Secondary | ICD-10-CM | POA: Diagnosis not present

## 2015-03-22 DIAGNOSIS — E559 Vitamin D deficiency, unspecified: Secondary | ICD-10-CM | POA: Diagnosis not present

## 2015-03-22 DIAGNOSIS — R32 Unspecified urinary incontinence: Secondary | ICD-10-CM | POA: Diagnosis not present

## 2015-03-22 DIAGNOSIS — S0101XD Laceration without foreign body of scalp, subsequent encounter: Secondary | ICD-10-CM | POA: Diagnosis not present

## 2015-03-22 DIAGNOSIS — I10 Essential (primary) hypertension: Secondary | ICD-10-CM | POA: Diagnosis not present

## 2015-03-22 DIAGNOSIS — F0390 Unspecified dementia without behavioral disturbance: Secondary | ICD-10-CM | POA: Diagnosis not present

## 2015-03-22 DIAGNOSIS — S065X0D Traumatic subdural hemorrhage without loss of consciousness, subsequent encounter: Secondary | ICD-10-CM | POA: Diagnosis not present

## 2015-03-22 DIAGNOSIS — E785 Hyperlipidemia, unspecified: Secondary | ICD-10-CM | POA: Diagnosis not present

## 2015-03-22 DIAGNOSIS — Z8673 Personal history of transient ischemic attack (TIA), and cerebral infarction without residual deficits: Secondary | ICD-10-CM | POA: Diagnosis not present

## 2015-03-27 DIAGNOSIS — S0101XD Laceration without foreign body of scalp, subsequent encounter: Secondary | ICD-10-CM | POA: Diagnosis not present

## 2015-03-27 DIAGNOSIS — Z8673 Personal history of transient ischemic attack (TIA), and cerebral infarction without residual deficits: Secondary | ICD-10-CM | POA: Diagnosis not present

## 2015-03-27 DIAGNOSIS — I10 Essential (primary) hypertension: Secondary | ICD-10-CM | POA: Diagnosis not present

## 2015-03-27 DIAGNOSIS — E559 Vitamin D deficiency, unspecified: Secondary | ICD-10-CM | POA: Diagnosis not present

## 2015-03-27 DIAGNOSIS — S065X0D Traumatic subdural hemorrhage without loss of consciousness, subsequent encounter: Secondary | ICD-10-CM | POA: Diagnosis not present

## 2015-03-27 DIAGNOSIS — F0390 Unspecified dementia without behavioral disturbance: Secondary | ICD-10-CM | POA: Diagnosis not present

## 2015-03-27 DIAGNOSIS — E785 Hyperlipidemia, unspecified: Secondary | ICD-10-CM | POA: Diagnosis not present

## 2015-03-27 DIAGNOSIS — R32 Unspecified urinary incontinence: Secondary | ICD-10-CM | POA: Diagnosis not present

## 2015-03-31 DIAGNOSIS — E559 Vitamin D deficiency, unspecified: Secondary | ICD-10-CM | POA: Diagnosis not present

## 2015-03-31 DIAGNOSIS — E785 Hyperlipidemia, unspecified: Secondary | ICD-10-CM | POA: Diagnosis not present

## 2015-03-31 DIAGNOSIS — F0390 Unspecified dementia without behavioral disturbance: Secondary | ICD-10-CM | POA: Diagnosis not present

## 2015-03-31 DIAGNOSIS — S0101XD Laceration without foreign body of scalp, subsequent encounter: Secondary | ICD-10-CM | POA: Diagnosis not present

## 2015-03-31 DIAGNOSIS — S065X0D Traumatic subdural hemorrhage without loss of consciousness, subsequent encounter: Secondary | ICD-10-CM | POA: Diagnosis not present

## 2015-03-31 DIAGNOSIS — Z8673 Personal history of transient ischemic attack (TIA), and cerebral infarction without residual deficits: Secondary | ICD-10-CM | POA: Diagnosis not present

## 2015-03-31 DIAGNOSIS — I10 Essential (primary) hypertension: Secondary | ICD-10-CM | POA: Diagnosis not present

## 2015-03-31 DIAGNOSIS — R32 Unspecified urinary incontinence: Secondary | ICD-10-CM | POA: Diagnosis not present

## 2015-04-10 DIAGNOSIS — E559 Vitamin D deficiency, unspecified: Secondary | ICD-10-CM | POA: Diagnosis not present

## 2015-04-10 DIAGNOSIS — M6281 Muscle weakness (generalized): Secondary | ICD-10-CM | POA: Diagnosis not present

## 2015-04-10 DIAGNOSIS — G309 Alzheimer's disease, unspecified: Secondary | ICD-10-CM | POA: Diagnosis not present

## 2015-04-10 DIAGNOSIS — S065X0D Traumatic subdural hemorrhage without loss of consciousness, subsequent encounter: Secondary | ICD-10-CM | POA: Diagnosis not present

## 2015-05-11 DIAGNOSIS — R2689 Other abnormalities of gait and mobility: Secondary | ICD-10-CM | POA: Diagnosis not present

## 2015-05-11 DIAGNOSIS — M6281 Muscle weakness (generalized): Secondary | ICD-10-CM | POA: Diagnosis not present

## 2015-05-11 DIAGNOSIS — R278 Other lack of coordination: Secondary | ICD-10-CM | POA: Diagnosis not present

## 2015-05-11 DIAGNOSIS — R2681 Unsteadiness on feet: Secondary | ICD-10-CM | POA: Diagnosis not present

## 2015-05-13 DIAGNOSIS — R2689 Other abnormalities of gait and mobility: Secondary | ICD-10-CM | POA: Diagnosis not present

## 2015-05-13 DIAGNOSIS — R2681 Unsteadiness on feet: Secondary | ICD-10-CM | POA: Diagnosis not present

## 2015-05-13 DIAGNOSIS — R278 Other lack of coordination: Secondary | ICD-10-CM | POA: Diagnosis not present

## 2015-05-13 DIAGNOSIS — M6281 Muscle weakness (generalized): Secondary | ICD-10-CM | POA: Diagnosis not present

## 2015-05-14 DIAGNOSIS — M6281 Muscle weakness (generalized): Secondary | ICD-10-CM | POA: Diagnosis not present

## 2015-05-14 DIAGNOSIS — R278 Other lack of coordination: Secondary | ICD-10-CM | POA: Diagnosis not present

## 2015-05-14 DIAGNOSIS — R2681 Unsteadiness on feet: Secondary | ICD-10-CM | POA: Diagnosis not present

## 2015-05-14 DIAGNOSIS — R2689 Other abnormalities of gait and mobility: Secondary | ICD-10-CM | POA: Diagnosis not present

## 2015-05-15 DIAGNOSIS — G309 Alzheimer's disease, unspecified: Secondary | ICD-10-CM | POA: Diagnosis not present

## 2015-05-15 DIAGNOSIS — K649 Unspecified hemorrhoids: Secondary | ICD-10-CM | POA: Diagnosis not present

## 2015-05-15 DIAGNOSIS — K5901 Slow transit constipation: Secondary | ICD-10-CM | POA: Diagnosis not present

## 2015-05-16 DIAGNOSIS — R278 Other lack of coordination: Secondary | ICD-10-CM | POA: Diagnosis not present

## 2015-05-16 DIAGNOSIS — R2681 Unsteadiness on feet: Secondary | ICD-10-CM | POA: Diagnosis not present

## 2015-05-16 DIAGNOSIS — R2689 Other abnormalities of gait and mobility: Secondary | ICD-10-CM | POA: Diagnosis not present

## 2015-05-16 DIAGNOSIS — M6281 Muscle weakness (generalized): Secondary | ICD-10-CM | POA: Diagnosis not present

## 2015-05-18 DIAGNOSIS — R278 Other lack of coordination: Secondary | ICD-10-CM | POA: Diagnosis not present

## 2015-05-18 DIAGNOSIS — R2681 Unsteadiness on feet: Secondary | ICD-10-CM | POA: Diagnosis not present

## 2015-05-18 DIAGNOSIS — R2689 Other abnormalities of gait and mobility: Secondary | ICD-10-CM | POA: Diagnosis not present

## 2015-05-18 DIAGNOSIS — M6281 Muscle weakness (generalized): Secondary | ICD-10-CM | POA: Diagnosis not present

## 2015-05-19 DIAGNOSIS — R2689 Other abnormalities of gait and mobility: Secondary | ICD-10-CM | POA: Diagnosis not present

## 2015-05-19 DIAGNOSIS — R278 Other lack of coordination: Secondary | ICD-10-CM | POA: Diagnosis not present

## 2015-05-19 DIAGNOSIS — M6281 Muscle weakness (generalized): Secondary | ICD-10-CM | POA: Diagnosis not present

## 2015-05-19 DIAGNOSIS — R2681 Unsteadiness on feet: Secondary | ICD-10-CM | POA: Diagnosis not present

## 2015-05-20 DIAGNOSIS — R2689 Other abnormalities of gait and mobility: Secondary | ICD-10-CM | POA: Diagnosis not present

## 2015-05-20 DIAGNOSIS — R278 Other lack of coordination: Secondary | ICD-10-CM | POA: Diagnosis not present

## 2015-05-20 DIAGNOSIS — M6281 Muscle weakness (generalized): Secondary | ICD-10-CM | POA: Diagnosis not present

## 2015-05-20 DIAGNOSIS — R2681 Unsteadiness on feet: Secondary | ICD-10-CM | POA: Diagnosis not present

## 2015-05-22 DIAGNOSIS — M150 Primary generalized (osteo)arthritis: Secondary | ICD-10-CM | POA: Diagnosis not present

## 2015-05-22 DIAGNOSIS — R2681 Unsteadiness on feet: Secondary | ICD-10-CM | POA: Diagnosis not present

## 2015-05-22 DIAGNOSIS — M6281 Muscle weakness (generalized): Secondary | ICD-10-CM | POA: Diagnosis not present

## 2015-05-22 DIAGNOSIS — R2689 Other abnormalities of gait and mobility: Secondary | ICD-10-CM | POA: Diagnosis not present

## 2015-05-22 DIAGNOSIS — R278 Other lack of coordination: Secondary | ICD-10-CM | POA: Diagnosis not present

## 2015-05-24 ENCOUNTER — Emergency Department (HOSPITAL_COMMUNITY): Payer: Medicare Other

## 2015-05-24 ENCOUNTER — Emergency Department (HOSPITAL_COMMUNITY)
Admission: EM | Admit: 2015-05-24 | Discharge: 2015-05-24 | Disposition: A | Discharge status: Home / Self Care | Payer: Medicare Other | Attending: Emergency Medicine | Admitting: Emergency Medicine

## 2015-05-24 ENCOUNTER — Encounter (HOSPITAL_COMMUNITY): Payer: Self-pay | Admitting: *Deleted

## 2015-05-24 DIAGNOSIS — R2689 Other abnormalities of gait and mobility: Secondary | ICD-10-CM | POA: Diagnosis not present

## 2015-05-24 DIAGNOSIS — R079 Chest pain, unspecified: Secondary | ICD-10-CM | POA: Diagnosis not present

## 2015-05-24 DIAGNOSIS — Z87891 Personal history of nicotine dependence: Secondary | ICD-10-CM | POA: Diagnosis not present

## 2015-05-24 DIAGNOSIS — F0390 Unspecified dementia without behavioral disturbance: Secondary | ICD-10-CM | POA: Insufficient documentation

## 2015-05-24 DIAGNOSIS — Y998 Other external cause status: Secondary | ICD-10-CM | POA: Diagnosis not present

## 2015-05-24 DIAGNOSIS — R259 Unspecified abnormal involuntary movements: Secondary | ICD-10-CM | POA: Diagnosis not present

## 2015-05-24 DIAGNOSIS — W1839XA Other fall on same level, initial encounter: Secondary | ICD-10-CM | POA: Insufficient documentation

## 2015-05-24 DIAGNOSIS — Y9289 Other specified places as the place of occurrence of the external cause: Secondary | ICD-10-CM | POA: Diagnosis not present

## 2015-05-24 DIAGNOSIS — M6281 Muscle weakness (generalized): Secondary | ICD-10-CM | POA: Diagnosis not present

## 2015-05-24 DIAGNOSIS — Z79899 Other long term (current) drug therapy: Secondary | ICD-10-CM | POA: Insufficient documentation

## 2015-05-24 DIAGNOSIS — Z8673 Personal history of transient ischemic attack (TIA), and cerebral infarction without residual deficits: Secondary | ICD-10-CM | POA: Insufficient documentation

## 2015-05-24 DIAGNOSIS — R278 Other lack of coordination: Secondary | ICD-10-CM | POA: Diagnosis not present

## 2015-05-24 DIAGNOSIS — W19XXXA Unspecified fall, initial encounter: Secondary | ICD-10-CM

## 2015-05-24 DIAGNOSIS — I10 Essential (primary) hypertension: Secondary | ICD-10-CM | POA: Diagnosis not present

## 2015-05-24 DIAGNOSIS — S199XXA Unspecified injury of neck, initial encounter: Secondary | ICD-10-CM | POA: Diagnosis not present

## 2015-05-24 DIAGNOSIS — S2231XA Fracture of one rib, right side, initial encounter for closed fracture: Secondary | ICD-10-CM | POA: Insufficient documentation

## 2015-05-24 DIAGNOSIS — E780 Pure hypercholesterolemia: Secondary | ICD-10-CM | POA: Insufficient documentation

## 2015-05-24 DIAGNOSIS — R2681 Unsteadiness on feet: Secondary | ICD-10-CM | POA: Diagnosis not present

## 2015-05-24 DIAGNOSIS — S29001A Unspecified injury of muscle and tendon of front wall of thorax, initial encounter: Secondary | ICD-10-CM | POA: Diagnosis present

## 2015-05-24 DIAGNOSIS — Y9389 Activity, other specified: Secondary | ICD-10-CM | POA: Diagnosis not present

## 2015-05-24 DIAGNOSIS — M25559 Pain in unspecified hip: Secondary | ICD-10-CM | POA: Diagnosis not present

## 2015-05-24 MED ORDER — TRAMADOL HCL 50 MG PO TABS: 50.0000 mg | ORAL_TABLET | Freq: Four times a day (QID) | ORAL | Status: DC | PRN

## 2015-05-24 MED ORDER — ACETAMINOPHEN 325 MG PO TABS
650.0000 mg | ORAL_TABLET | Freq: Once | ORAL | Status: AC
  Administered 2015-05-24: 650 mg via ORAL
  Filled 2015-05-24: qty 2

## 2015-05-24 MED ORDER — TRAMADOL HCL 50 MG PO TABS
50.0000 mg | ORAL_TABLET | Freq: Once | ORAL | Status: AC
  Administered 2015-05-24: 50 mg via ORAL
  Filled 2015-05-24: qty 1

## 2015-05-24 NOTE — Discharge Instructions (Signed)
Please take tramadol and Tylenol for pain control. You have a rib fracture on your right eighth rib. Please make sure your pain is well controlled to avoid development of pneumonia. We have also sent you home with an incentive spirometer. Please blow as hard as you can 10 times an hour or everytime there is a commericial break on TV. This will prevent development of pneumonia. Return without fail for worsening symptoms, including fever, severe cough, worsening pain, confusion, difficulty breathing, or any other symptoms concerning to you. Avoid being bed bound. Physical therapy should be tailored to your injury.  Fall Prevention and Home Safety Falls cause injuries and can affect all age groups. It is possible to prevent falls.  HOW TO PREVENT FALLS  Wear shoes with rubber soles that do not have an opening for your toes.  Keep the inside and outside of your house well lit.  Use night lights throughout your home.  Remove clutter from floors.  Clean up floor spills.  Remove throw rugs or fasten them to the floor with carpet tape.  Do not place electrical cords across pathways.  Put grab bars by your tub, shower, and toilet. Do not use towel bars as grab bars.  Put handrails on both sides of the stairway. Fix loose handrails.  Do not climb on stools or stepladders, if possible.  Do not wax your floors.  Repair uneven or unsafe sidewalks, walkways, or stairs.  Keep items you use a lot within reach.  Be aware of pets.  Keep emergency numbers next to the telephone.  Put smoke detectors in your home and near bedrooms. Ask your doctor what other things you can do to prevent falls. Document Released: 06/08/2009 Document Revised: 02/11/2012 Document Reviewed: 11/12/2011 [facility] Patient Information 2015 [place], [place]. This information is not intended to replace advice given to you by your health care provider. Make sure you discuss any questions you have with your health care  provider.  Rib Fracture A rib fracture is a break or crack in one of the bones of the ribs. The ribs are like a cage that goes around your upper chest. A broken or cracked rib is often painful, but most do not cause other problems. Most rib fractures heal on their own in 1-3 months. HOME CARE  Avoid activities that cause pain to the injured area. Protect your injured area.  Slowly increase activity as told by your doctor.  Take medicine as told by your doctor.  Put ice on the injured area for the first 1-2 days after you have been treated or as told by your doctor.  Put ice in a plastic bag.  Place a towel between your skin and the bag.  Leave the ice on for 15-20 minutes at a time, every 2 hours while you are awake.  Do deep breathing as told by your doctor. You may be told to:  Take deep breaths many times a day.  Cough many times a day while hugging a pillow.  Use a device (incentive spirometer) to perform deep breathing many times a day.  Drink enough fluids to keep your pee (urine) clear or pale yellow.   Do not wear a rib belt or binder. These do not allow you to breathe deeply. GET HELP RIGHT AWAY IF:   You have a fever.  You have trouble breathing.   You cannot stop coughing.  You cough up thick or bloody spit (mucus).   You feel sick to your stomach (nauseous), throw  up (vomit), or have belly (abdominal) pain.   Your pain gets worse and medicine does not help.  MAKE SURE YOU:   Understand these instructions.  Will watch your condition.  Will get help right away if you are not doing well or get worse. Document Released: 05/21/2008 Document Revised: 12/07/2012 Document Reviewed: 10/14/2012 [facility] Patient Information 2015 [place], [place]. This information is not intended to replace advice given to you by your health care provider. Make sure you discuss any questions you have with your health care provider.

## 2015-05-24 NOTE — ED Notes (Signed)
Pt is non-verbal but alert

## 2015-05-24 NOTE — ED Notes (Signed)
ptar here to transport back to nursing home

## 2015-05-24 NOTE — ED Notes (Signed)
Med tech at [facility]

## 2015-05-24 NOTE — ED Notes (Signed)
pts diaper changed small formed soft brown stool.  Pt given crackers and liquids.  ptar called.  Report called back to [facility]

## 2015-05-24 NOTE — ED Notes (Signed)
Pt lives in [place].  She had a fall and was found laying on her Right side on the ground.  The fall was unwitnessed and the pt has a history of Dementia and is nonverbal, so she was brought to the ED for further eval.  When asked was she hurting the pt did point at her chest.  Pt was given  of zofran and 1 nitro in route.  VS are as follows: BP: 142/88 HR: 72 CBG: 104 SPO2 97% on RA.

## 2015-05-24 NOTE — ED Provider Notes (Signed)
CSN: [id number]     Arrival date & time 05/24/15  1358 History   First MD Initiated Contact with Patient 05/24/15 1455     Chief Complaint  Patient presents with  . Fall     (Consider location/radiation/quality/duration/timing/severity/associated sxs/prior Treatment) HPI  81 year old female who presents with fall. History of CVA, hypertension, hyperlipidemia, and recent traumatic subdural hemorrhage in June 2016. She currently resides at [facility]. History is provided by the patient's sister who reports that she was told by staff that she was found on her right side. Just prior to this physical therapist had placed her in a recliner, which she was found on the floor next to. Fall was unwitnessed, but patient denies that she had any loss of consciousness. She points to right chest wall when asked about pain after her fall. She denies feeling sick recently.   Past Medical History  Diagnosis Date  . CVA (cerebral infarction)   . Hypertension   . High cholesterol   . Dementia   . Incontinent of urine    History reviewed. No pertinent past surgical history. History reviewed. No pertinent family history. Social History  Substance Use Topics  . Smoking status: Former Smoker    Types: Cigarettes    Quit date: 12/26/2014  . Smokeless tobacco: Never Used     Comment: " smoked most of her life "  . Alcohol Use: No   OB History    No data available     Review of Systems  Unable to perform ROS: Patient nonverbal      Allergies  Bee venom  Home Medications   Prior to Admission medications   Medication Sig Start Date End Date Taking? Authorizing Provider  acetaminophen (TYLENOL) 500 MG tablet Take 500 mg by mouth every 8 (eight) hours.   Yes Historical Provider, MD  alum & mag hydroxide-simeth (GERI-LANTA) 200-200-20 MG/5ML suspension Take 30 mLs by mouth every 6 (six) hours as needed for indigestion or heartburn.   Yes Historical Provider, MD  calcium carbonate (OS-CAL -  DOSED IN MG OF ELEMENTAL CALCIUM) 1250 (500 CA) MG tablet Take 1 tablet (500 mg of elemental calcium total) by mouth daily with breakfast. 02/09/15  Yes Marjan Rabbani, MD  donepezil (ARICEPT) 5 MG tablet Take 5 mg by mouth at bedtime.    Yes Historical Provider, MD  fish oil-omega-3 fatty acids 1000 MG capsule Take 1 g by mouth 2 (two) times daily.   Yes Historical Provider, MD  guaifenesin (ROBITUSSIN) 100 MG/5ML syrup Take 200 mg by mouth 3 (three) times daily as needed for cough.   Yes Historical Provider, MD  hydrochlorothiazide (MICROZIDE) 12.5 MG capsule Take 12.5 mg by mouth daily.   Yes Historical Provider, MD  hydrocortisone (ANUSOL-HC) 2.5 % rectal cream Place 1 application rectally 4 (four) times daily.   Yes Historical Provider, MD  loperamide (IMODIUM) 2 MG capsule Take 2 mg by mouth as needed for diarrhea or loose stools.   Yes Historical Provider, MD  magnesium hydroxide (MILK OF MAGNESIA) 400 MG/5ML suspension Take 30 mLs by mouth daily as needed for mild constipation.   Yes Historical Provider, MD  neomycin-bacitracin-polymyxin (NEOSPORIN) OINT Apply 1 application topically daily as needed for wound care.   Yes Historical Provider, MD  polyethylene glycol (MIRALAX / GLYCOLAX) packet Take 17 g by mouth daily.   Yes Historical Provider, MD  traMADol (ULTRAM) 50 MG tablet Take 50 mg by mouth every 6 (six) hours as needed.   Yes Historical Provider,  MD  verapamil (CALAN) 80 MG tablet Take 80 mg by mouth 3 (three) times daily. Takes last dose two hours prior to evening meal   Yes Historical Provider, MD  Vitamin D, Ergocalciferol, (DRISDOL) 50000 UNITS CAPS capsule Take 50,000 Units by mouth every 7 (seven) days. Wednesday   Yes Historical Provider, MD  hydrochlorothiazide (HYDRODIURIL) 25 MG tablet Take 12.5 mg by mouth every morning.    Historical Provider, MD  traMADol (ULTRAM) 50 MG tablet Take 1 tablet (50 mg total) by mouth every 6 (six) hours as needed for moderate pain or severe  pain. 05/24/15   Lavera Guise, MD   BP 170/59 mmHg  Pulse 72  Temp(Src) 98.2 F (36.8 C) (Oral)  Resp 18  SpO2 97% Physical Exam Physical Exam  Nursing note and vitals reviewed. Constitutional: Elderly appearing woman, well developed, well nourished, non-toxic, and in no acute distress Head: Normocephalic and atraumatic.  Mouth/Throat: Oropharynx is clear and moist.  Neck: Cervical collar in place. Cardiovascular: Normal rate and regular rhythm.  No edema. +2 DP and radial pulses Pulmonary/Chest: Effort normal and breath sounds normal. Right lower chest wall tenderness to palpation anteriorly.  Abdominal: Soft. There is no tenderness. There is no rebound and no guarding. No bruising. Musculoskeletal: Normal range of motion. No Bruising or deformities or swelling. Neurological: Alert, nonverbal at baseline, obeys commands and answers questions appropriately with nodding and shaking of head, moves all extremities to command, sensation to light touch in tact throughout, no facial droop Skin: Skin is warm and dry.  Psychiatric: Cooperative  ED Course  Procedures (including critical care time) Labs Review Labs Reviewed - No data to display  Imaging Review Dg Chest 2 View  05/24/2015   CLINICAL DATA:  Status post fall today  EXAM: CHEST  2 VIEW  COMPARISON:  April 02, 2014  FINDINGS: The heart size and mediastinal contours are stable. The heart size is enlarged. The aorta is tortuous. Both lungs are clear. There is displaced fractures of the lateral right eighth rib. Bilateral breast implants are identified.  IMPRESSION: Fracture of the right eighth rib as described. There is no pneumothorax. No acute pulmonary or cardiac abnormality are noted.   Electronically Signed   By: Sherian Rein M.D.   On: 05/24/2015 16:34   Dg Pelvis 1-2 Views  05/24/2015   CLINICAL DATA:  Fall.  Pelvic injury and pain.  Initial encounter.  EXAM: PELVIS - 1-2 VIEW  COMPARISON:  None.  FINDINGS: There is no  evidence of pelvic fracture or diastasis. No pelvic bone lesions are seen. Extensive iliac and femoral artery calcification noted.  IMPRESSION: No acute findings.   Electronically Signed   By: Myles Rosenthal M.D.   On: 05/24/2015 16:36   Ct Head Wo Contrast  05/24/2015   CLINICAL DATA:  Larey Seat. Hit head. History of prior CVA and subdural hematoma.  EXAM: CT HEAD WITHOUT CONTRAST  TECHNIQUE: Contiguous axial images were obtained from the base of the skull through the vertex without intravenous contrast.  COMPARISON:  02/09/2015  FINDINGS: Stable age related cerebral atrophy, ventriculomegaly and periventricular white matter disease. No extra-axial fluid collections are identified. No CT findings for acute hemispheric infarction or intracranial hemorrhage. No mass lesions. The brainstem and cerebellum are normal.  No acute skull fracture is identified. The paranasal sinuses and mastoid air cells are clear except for a chronic calcified lesion of the frontal sinus.  IMPRESSION: No acute intracranial findings or skull fracture.  Chronic age related cerebral  atrophy, ventriculomegaly and periventricular white matter disease. Complete resolution of the prior right-sided subdural hematoma.   Electronically Signed   By: Rudie Meyer M.D.   On: 05/24/2015 15:57   Ct Cervical Spine Wo Contrast  05/24/2015   CLINICAL DATA:  Larey Seat. Hit head. History of prior CVA and subdural hematoma.  EXAM: CT HEAD WITHOUT CONTRAST  TECHNIQUE: Contiguous axial images were obtained from the base of the skull through the vertex without intravenous contrast.  COMPARISON:  02/09/2015  FINDINGS: Stable age related cerebral atrophy, ventriculomegaly and periventricular white matter disease. No extra-axial fluid collections are identified. No CT findings for acute hemispheric infarction or intracranial hemorrhage. No mass lesions. The brainstem and cerebellum are normal.  No acute skull fracture is identified. The paranasal sinuses and mastoid air  cells are clear except for a chronic calcified lesion of the frontal sinus.  IMPRESSION: No acute intracranial findings or skull fracture.  Chronic age related cerebral atrophy, ventriculomegaly and periventricular white matter disease. Complete resolution of the prior right-sided subdural hematoma.   Electronically Signed   By: Rudie Meyer M.D.   On: 05/24/2015 15:57   I have personally reviewed and evaluated these images and lab results as part of my medical decision-making.   EKG Interpretation   Date/Time:  Wednesday May 24 2015 14:09:38 EDT Ventricular Rate:  67 PR Interval:  153 QRS Duration: 133 QT Interval:  439 QTC Calculation: 463 R Axis:   63 Text Interpretation:  Sinus rhythm No significant change since last  tracing Confirmed by LIU MD, DANA [phone]) on 05/24/2015 5:24:24 PM      MDM   Final diagnoses:  Fall, initial encounter  Right rib fracture, closed, initial encounter    In short, this is an 81 year old female with history of CVA and subdural hemorrhage who presents after fall from recliner. EMS had brought her in with a cervical collar. She on presentation is alert, behaving at her mental status baseline according to family members. She is grossly neurologically intact. On exam, endorses a right anterior chest wall tenderness to palpation. No other acute injuries noted on exam. She will undergo CT head, cervical spine, chest x-ray, and pelvis x-ray to evaluate for acute injuries. According to family, she does not ambulate by herself at baseline, and often requires assistance with ambulation or transfers,  or is wheelchair bound for transport.   CT head and cervical spine do not show acute traumatic injuries. cervical collar is cleared and she has normal range of motion of her neck without any pain. Her pelvic x-rays does not show any traumatic injuries. Chest x-ray shows isolated right eighth rib fracture that is nondisplaced. This is where she is having her chest  pain. She has responded well to tramadol the past and will take tramadol and Tylenol for pain control. I have instructed her nursing facility to also have her do incentive spirometry to prevent development of pneumonia. She is appropriate for discharge back to her nursing facility.  Lavera Guise, MD 05/24/15 [phone]

## 2015-05-24 NOTE — ED Notes (Signed)
To x-ray

## 2015-05-25 ENCOUNTER — Telehealth: Payer: Self-pay | Admitting: *Deleted

## 2015-05-25 NOTE — Telephone Encounter (Signed)
Pharmacy called related to Rx:  traMADol (ULTRAM) 50 MG tablet; Pt had same Rx filled 2 days ago with 90 tablets .Marland KitchenMarland KitchenNCM clarified with EDP to have pharmacy disregard Rx.

## 2015-05-29 DIAGNOSIS — M6281 Muscle weakness (generalized): Secondary | ICD-10-CM | POA: Diagnosis not present

## 2015-05-29 DIAGNOSIS — G309 Alzheimer's disease, unspecified: Secondary | ICD-10-CM | POA: Diagnosis not present

## 2015-05-29 DIAGNOSIS — I10 Essential (primary) hypertension: Secondary | ICD-10-CM | POA: Diagnosis not present

## 2015-05-29 DIAGNOSIS — S2231XA Fracture of one rib, right side, initial encounter for closed fracture: Secondary | ICD-10-CM | POA: Diagnosis not present

## 2015-06-30 ENCOUNTER — Encounter (HOSPITAL_COMMUNITY): Payer: Self-pay | Admitting: Nurse Practitioner

## 2015-06-30 ENCOUNTER — Emergency Department (HOSPITAL_COMMUNITY): Payer: Medicare Other

## 2015-06-30 ENCOUNTER — Emergency Department (HOSPITAL_COMMUNITY)
Admission: EM | Admit: 2015-06-30 | Discharge: 2015-07-01 | Disposition: A | Discharge status: Home / Self Care | Payer: Medicare Other | Attending: Emergency Medicine | Admitting: Emergency Medicine

## 2015-06-30 DIAGNOSIS — Y92129 Unspecified place in nursing home as the place of occurrence of the external cause: Secondary | ICD-10-CM | POA: Diagnosis not present

## 2015-06-30 DIAGNOSIS — S01111A Laceration without foreign body of right eyelid and periocular area, initial encounter: Secondary | ICD-10-CM | POA: Diagnosis not present

## 2015-06-30 DIAGNOSIS — F0390 Unspecified dementia without behavioral disturbance: Secondary | ICD-10-CM | POA: Insufficient documentation

## 2015-06-30 DIAGNOSIS — Z8639 Personal history of other endocrine, nutritional and metabolic disease: Secondary | ICD-10-CM | POA: Diagnosis not present

## 2015-06-30 DIAGNOSIS — Z87891 Personal history of nicotine dependence: Secondary | ICD-10-CM | POA: Insufficient documentation

## 2015-06-30 DIAGNOSIS — Z7952 Long term (current) use of systemic steroids: Secondary | ICD-10-CM | POA: Insufficient documentation

## 2015-06-30 DIAGNOSIS — S0180XA Unspecified open wound of other part of head, initial encounter: Secondary | ICD-10-CM | POA: Diagnosis not present

## 2015-06-30 DIAGNOSIS — Y9389 Activity, other specified: Secondary | ICD-10-CM | POA: Insufficient documentation

## 2015-06-30 DIAGNOSIS — Y998 Other external cause status: Secondary | ICD-10-CM | POA: Insufficient documentation

## 2015-06-30 DIAGNOSIS — Z8673 Personal history of transient ischemic attack (TIA), and cerebral infarction without residual deficits: Secondary | ICD-10-CM | POA: Insufficient documentation

## 2015-06-30 DIAGNOSIS — W1839XA Other fall on same level, initial encounter: Secondary | ICD-10-CM | POA: Insufficient documentation

## 2015-06-30 DIAGNOSIS — S0990XA Unspecified injury of head, initial encounter: Secondary | ICD-10-CM | POA: Diagnosis present

## 2015-06-30 DIAGNOSIS — I10 Essential (primary) hypertension: Secondary | ICD-10-CM | POA: Diagnosis not present

## 2015-06-30 DIAGNOSIS — W19XXXA Unspecified fall, initial encounter: Secondary | ICD-10-CM

## 2015-06-30 DIAGNOSIS — S0101XA Laceration without foreign body of scalp, initial encounter: Secondary | ICD-10-CM | POA: Diagnosis not present

## 2015-06-30 DIAGNOSIS — Z79899 Other long term (current) drug therapy: Secondary | ICD-10-CM | POA: Diagnosis not present

## 2015-06-30 DIAGNOSIS — S098XXA Other specified injuries of head, initial encounter: Secondary | ICD-10-CM | POA: Diagnosis not present

## 2015-06-30 NOTE — ED Provider Notes (Signed)
CSN: [id number]     Arrival date & time 06/30/15  2310 History  By signing my name below, I, Murriel Hopperlec Bankhead, attest that this documentation has been prepared under the direction and in the presence of Anivea Velasques, MD. Electronically Signed: Murriel HopperAlec Bankhead, ED Scribe. 06/30/2015. 12:32 AM.     Chief Complaint  Patient presents with  . Fall  . Head Injury     Patient is a 81 y.o. female presenting with fall and head injury. The history is provided by a relative. The history is limited by the condition of the patient (dementia level 5 caveat). No language interpreter was used.  Fall This is a new problem. The current episode started 1 to 2 hours ago. The problem occurs constantly. Pertinent negatives include no chest pain, no abdominal pain and no shortness of breath. Nothing aggravates the symptoms. Nothing relieves the symptoms. She has tried nothing for the symptoms. The treatment provided no relief.  Head Injury  HPI Comments: Hanley HaysBettye G Scalisi is a 81 y.o. female who [facility] the Emergency Department [facility] after a fall earlier this evening. Pt reportedly found laying on the floor 15 minutes after she was put to bed. Members of facility are unsure how she fell.  Pt reportedly at her dementia baseline.     Past Medical History  Diagnosis Date  . CVA (cerebral infarction)   . Hypertension   . High cholesterol   . Dementia   . Incontinent of urine    History reviewed. No pertinent past surgical history. History reviewed. No pertinent family history. Social History  Substance Use Topics  . Smoking status: Former Smoker    Types: Cigarettes    Quit date: 12/26/2014  . Smokeless tobacco: Never Used     Comment: " smoked most of her life "  . Alcohol Use: No   OB History    No data available     Review of Systems  Unable to perform ROS: Dementia  Respiratory: Negative for shortness of breath.   Cardiovascular: Negative for chest pain.   Gastrointestinal: Negative for abdominal pain.  Skin: Positive for wound.      Allergies  Bee venom  Home Medications   Prior to Admission medications   Medication Sig Start Date End Date Taking? Authorizing Provider  acetaminophen (TYLENOL) 500 MG tablet Take 500 mg by mouth every 8 (eight) hours.    Historical Provider, MD  alum & mag hydroxide-simeth (GERI-LANTA) 200-200-20 MG/5ML suspension Take 30 mLs by mouth every 6 (six) hours as needed for indigestion or heartburn.    Historical Provider, MD  calcium carbonate (OS-CAL - DOSED IN MG OF ELEMENTAL CALCIUM) 1250 (500 CA) MG tablet Take 1 tablet (500 mg of elemental calcium total) by mouth daily with breakfast. 02/09/15   Otis BraceMarjan Rabbani, MD  donepezil (ARICEPT) 5 MG tablet Take 5 mg by mouth at bedtime.     Historical Provider, MD  fish oil-omega-3 fatty acids 1000 MG capsule Take 1 g by mouth 2 (two) times daily.    Historical Provider, MD  guaifenesin (ROBITUSSIN) 100 MG/5ML syrup Take 200 mg by mouth 3 (three) times daily as needed for cough.    Historical Provider, MD  hydrochlorothiazide (HYDRODIURIL) 25 MG tablet Take 12.5 mg by mouth every morning.    Historical Provider, MD  hydrochlorothiazide (MICROZIDE) 12.5 MG capsule Take 12.5 mg by mouth daily.    Historical Provider, MD  hydrocortisone (ANUSOL-HC) 2.5 % rectal cream Place 1  application rectally 4 (four) times daily.    Historical Provider, MD  loperamide (IMODIUM) 2 MG capsule Take 2 mg by mouth as needed for diarrhea or loose stools.    Historical Provider, MD  magnesium hydroxide (MILK OF MAGNESIA) 400 MG/5ML suspension Take 30 mLs by mouth daily as needed for mild constipation.    Historical Provider, MD  neomycin-bacitracin-polymyxin (NEOSPORIN) OINT Apply 1 application topically daily as needed for wound care.    Historical Provider, MD  polyethylene glycol (MIRALAX / GLYCOLAX) packet Take 17 g by mouth daily.    Historical Provider, MD  traMADol (ULTRAM) 50 MG  tablet Take 50 mg by mouth every 6 (six) hours as needed.    Historical Provider, MD  traMADol (ULTRAM) 50 MG tablet Take 1 tablet (50 mg total) by mouth every 6 (six) hours as needed for moderate pain or severe pain. 05/24/15   Lavera Guise, MD  verapamil (CALAN) 80 MG tablet Take 80 mg by mouth 3 (three) times daily. Takes last dose two hours prior to evening meal    Historical Provider, MD  Vitamin D, Ergocalciferol, (DRISDOL) 50000 UNITS CAPS capsule Take 50,000 Units by mouth every 7 (seven) days. Wednesday    Historical Provider, MD   BP 159/79 mmHg  Pulse 76  Temp(Src) 97.4 F (36.3 C) (Oral)  Resp 20  SpO2 94% Physical Exam  Constitutional: She appears well-developed and well-nourished. No distress.  HENT:  Head: Normocephalic and atraumatic. Head is without raccoon's eyes and without Battle's sign.    Right Ear: No mastoid tenderness. No hemotympanum.  Left Ear: No mastoid tenderness. No hemotympanum.  Mouth/Throat: Oropharynx is clear and moist.  No battle sign Moist mucous membranes No hemotemp on left and right ears Wax in right ear  Eyes: Conjunctivae and EOM are normal. Pupils are equal, round, and reactive to light.  No raccoon eyes  Neck: Normal range of motion. Neck supple.  Cardiovascular: Normal rate, regular rhythm and intact distal pulses.   Pulmonary/Chest: Effort normal and breath sounds normal. No respiratory distress. She has no wheezes. She has no rales.  Abdominal: Soft. Bowel sounds are normal. There is no tenderness. There is no rebound and no guarding.  Musculoskeletal: Normal range of motion. She exhibits no edema or tenderness.  Good DP pulses bilateral No foreshortening or external rotation Negative anterior and posterior gero  No snuff box tenderness No deformity of any extremities  Neurological: She is alert. She has normal reflexes.  Skin: Skin is warm and dry.  Psychiatric: She has a normal mood and affect.    ED Course  Procedures  (including critical care time)  DIAGNOSTIC STUDIES: Oxygen Saturation is 94% on room air, adequate by my interpretation.    COORDINATION OF CARE: 12:17 AM Discussed treatment plan with pt at bedside and pt agreed to plan.   Labs Review Labs Reviewed - No data to display  Imaging Review No results found. I have personally reviewed and evaluated these images and lab results as part of my medical decision-making.   EKG Interpretation None      MDM   Final diagnoses:  None   No fractures or internal trauma, safe for discharge at this time  LACERATION REPAIR Performed by: Jasmine Awe Authorized by: Jasmine Awe Consent: Verbal consent obtained. Risks and benefits: risks, benefits and alternatives were discussed Consent given by: patient Patient identity confirmed: provided demographic data Prepped and Draped in normal sterile fashion Wound explored  Laceration Location: lateral to right  eyebrow  Laceration Length: 1 cm  No Foreign Bodies seen or palpated  Irrigation method: syringe Amount of cleaning: standard  Skin closure: dermabond   Patient tolerance: Patient tolerated the procedure well with no immediate complications.  I personally performed the services described in this documentation, which was scribed in my presence. The recorded information has been reviewed and is accurate.     Cy Blamer, MD 07/01/15 [phone]

## 2015-06-30 NOTE — ED Notes (Signed)
[facility]. Found laying on the fall 15 minutes after facility staff put her to bed, laceration on her right eyebrow. No obvious signs pain, pt is at her dementia baseline.

## 2015-07-01 ENCOUNTER — Encounter (HOSPITAL_COMMUNITY): Payer: Self-pay | Admitting: Emergency Medicine

## 2015-07-01 ENCOUNTER — Emergency Department (HOSPITAL_COMMUNITY): Payer: Medicare Other

## 2015-07-01 DIAGNOSIS — F0390 Unspecified dementia without behavioral disturbance: Secondary | ICD-10-CM | POA: Diagnosis not present

## 2015-07-01 DIAGNOSIS — R259 Unspecified abnormal involuntary movements: Secondary | ICD-10-CM | POA: Diagnosis not present

## 2015-07-01 NOTE — Discharge Instructions (Signed)

## 2015-07-03 DIAGNOSIS — I10 Essential (primary) hypertension: Secondary | ICD-10-CM | POA: Diagnosis not present

## 2015-07-03 DIAGNOSIS — M6281 Muscle weakness (generalized): Secondary | ICD-10-CM | POA: Diagnosis not present

## 2015-07-03 DIAGNOSIS — G309 Alzheimer's disease, unspecified: Secondary | ICD-10-CM | POA: Diagnosis not present

## 2015-07-03 DIAGNOSIS — R296 Repeated falls: Secondary | ICD-10-CM | POA: Diagnosis not present

## 2015-11-24 IMAGING — US US SOFT TISSUE HEAD/NECK
1 series · 14 of 25 positions shown · non-contrast
Comparison: 04/12/2013 and earlier studies

CLINICAL DATA: ABNORMAL TSH, previous FNA biopsy of isthmic and
left lobe dominant lesions.

EXAM:
THYROID ULTRASOUND
TECHNIQUE: Ultrasound examination of the thyroid gland and adjacent soft
tissues was performed.

[Series 1: us soft tissue head/neck · 0.04mm/px · 14 of 68 slices shown]
[im 1/68]
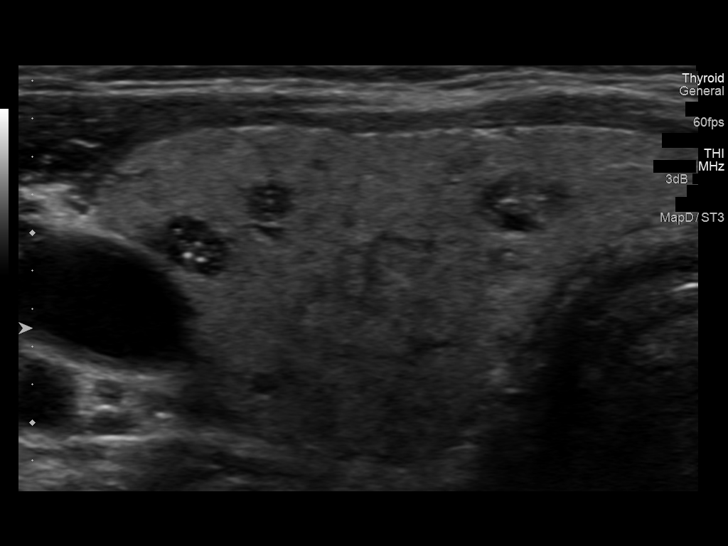
[im 6/68]
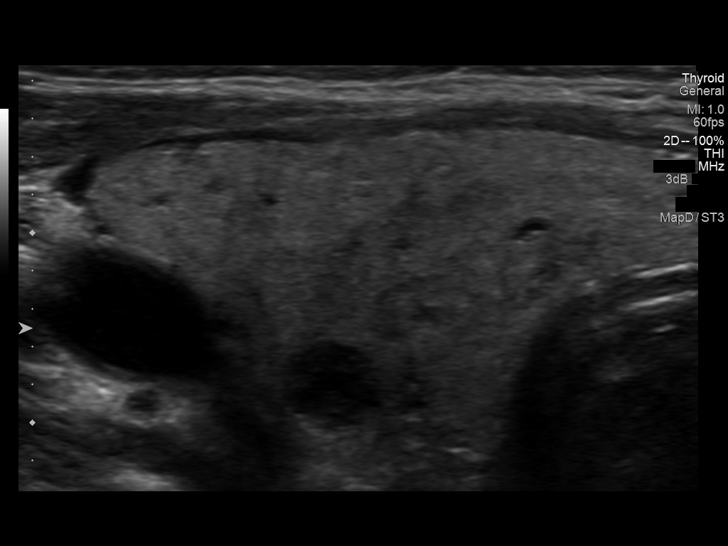
[im 12/68]
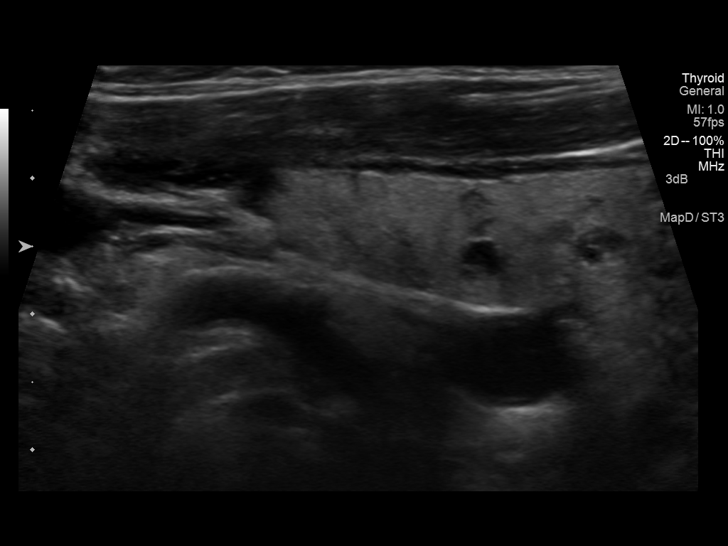
[im 17/68]
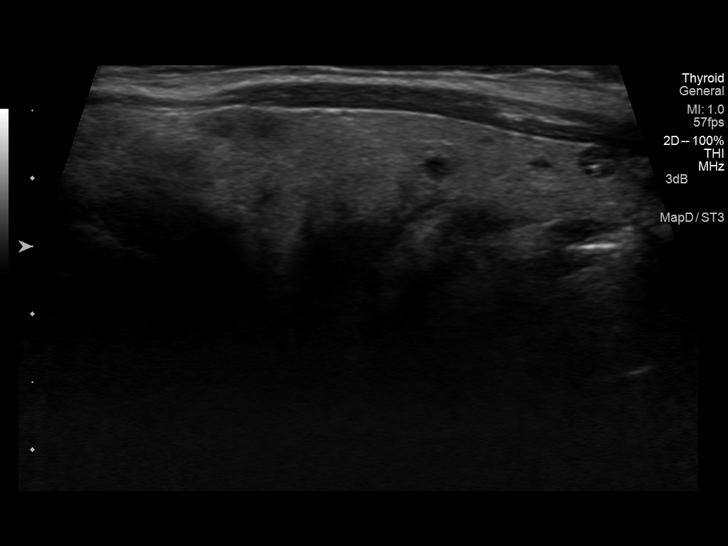
[im 23/68]
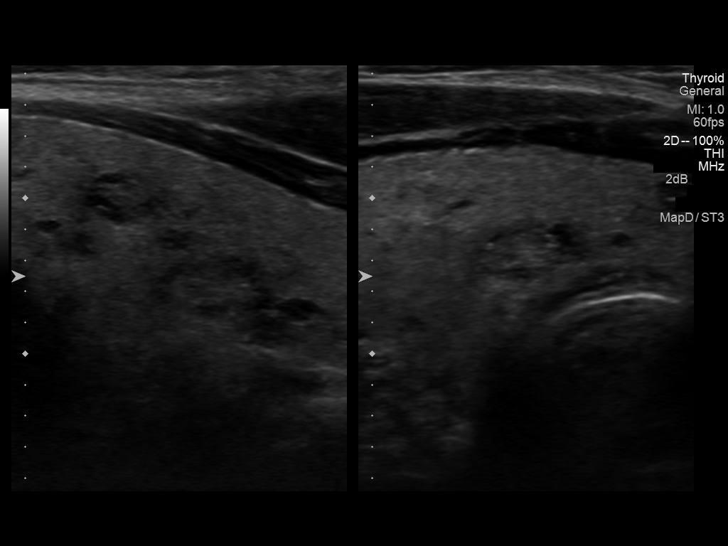
[im 26/68]
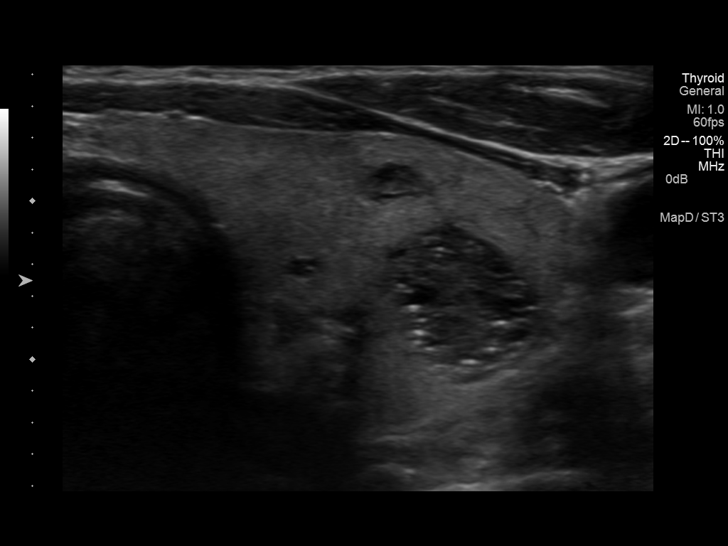
[im 31/68]
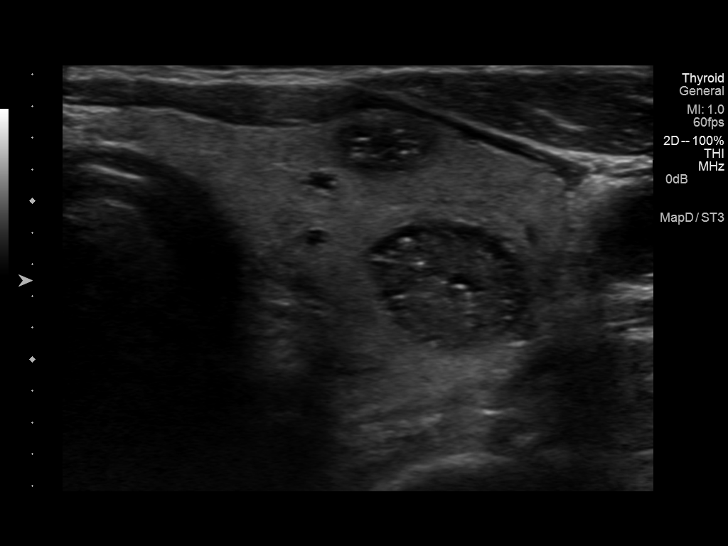
[im 37/68]
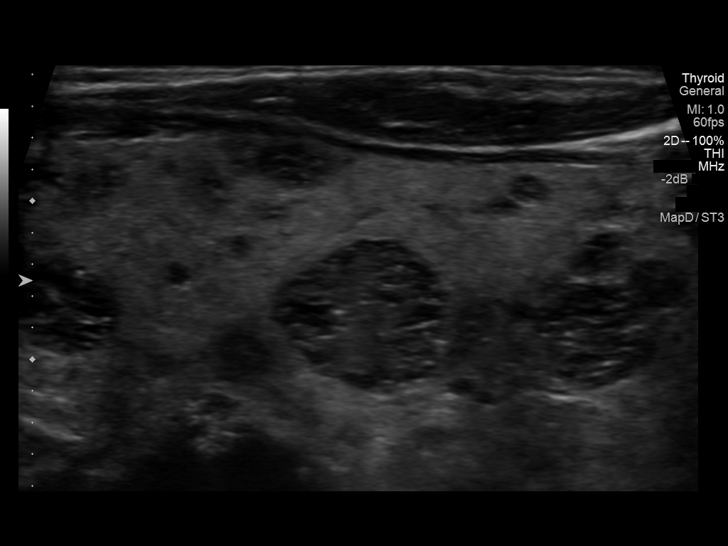
[im 42/68]
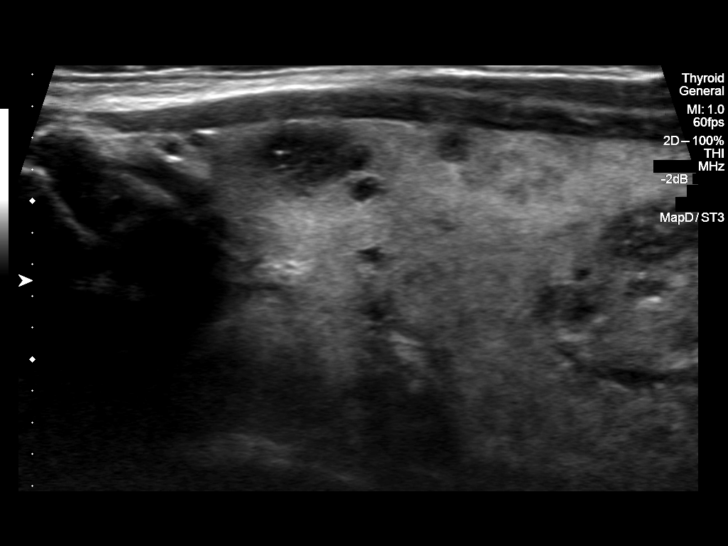
[im 45/68]
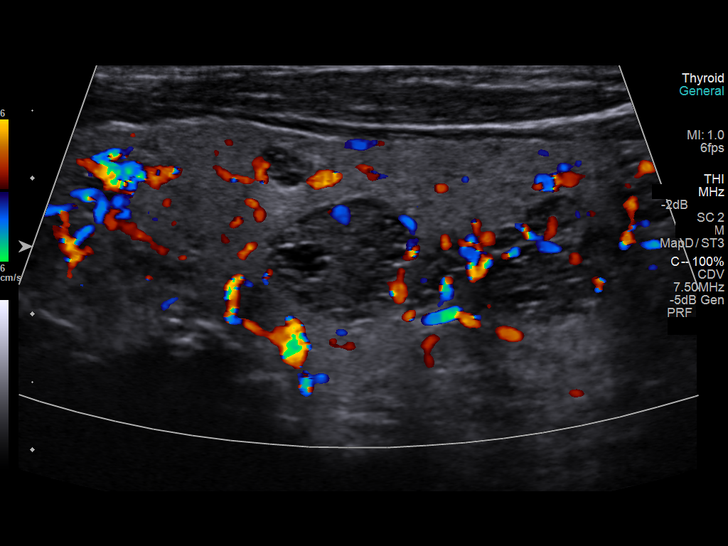
[im 51/68]
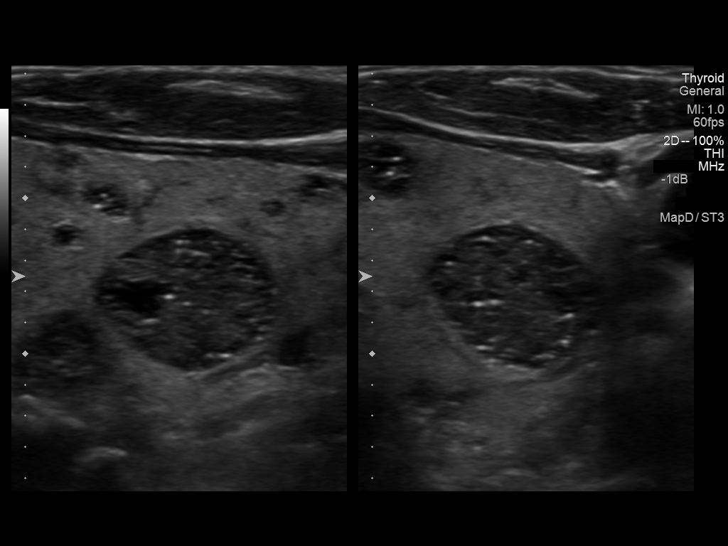
[im 56/68]
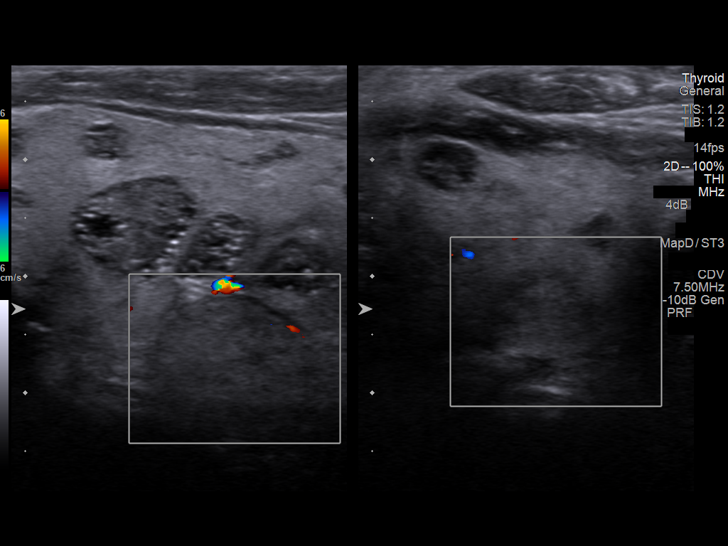
[im 62/68]
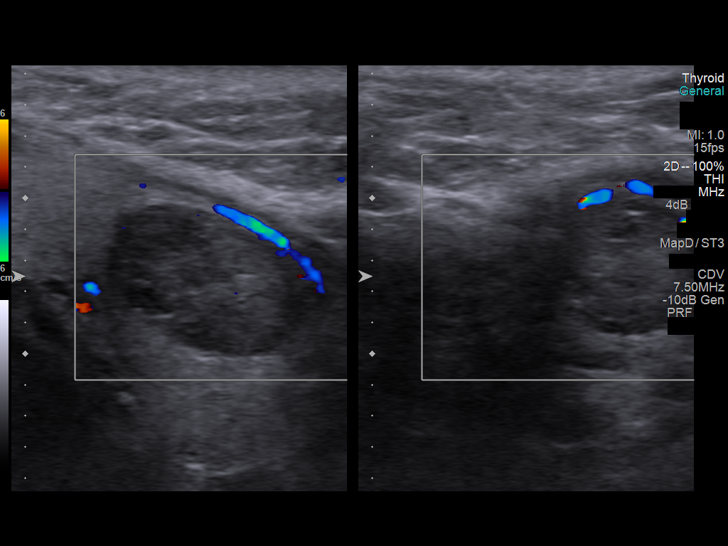
[im 68/68]
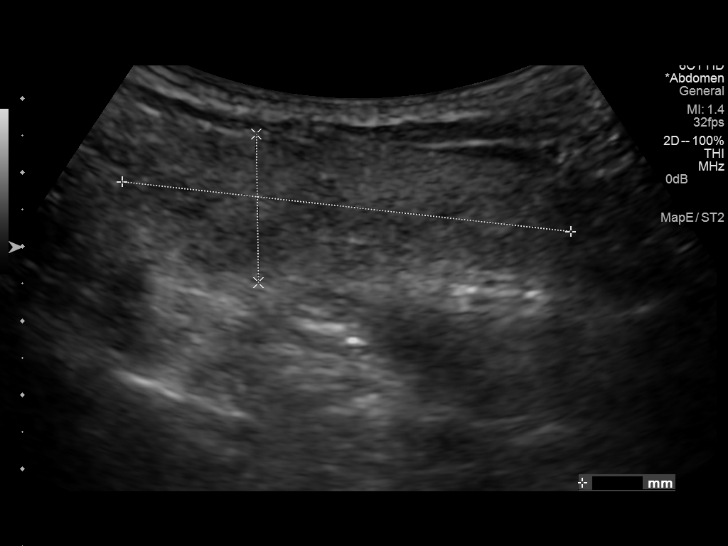

[14 of 25 positions shown; findings below may reference images not displayed]

FINDINGS: Right thyroid lobe

Measurements: 61 x 24 x 24 mm. Multiple small nodules. Largest 8 x 5
x 6 mm, superior pole, with microcalcifications. There is a
inferomedial 8 x 5 x 7 mm solid nodule.

Left thyroid lobe

Measurements: 61 x 20 x 23 mm. Multiple nodules. Superomedial 10 x 7
x 9 mm solid lesion with microcalcifications . 13 x 10 x 12 mm solid
with microcalcifications, mid lobe (previously 9 x 11 x 11). Nearly
x 11 x 16).

Isthmus

Thickness: 5.7 mm. 15 x 9 x 13 mm nodule from the superior left
aspect (previously 8 x 13 x 14).

Lymphadenopathy

None visualized.
IMPRESSION: 1. Thyromegaly with multiple nodules. No convincing increase in size
of dominant lesions. Correlate with previous biopsy results.

## 2016-04-10 ENCOUNTER — Encounter (HOSPITAL_COMMUNITY): Payer: Self-pay

## 2016-04-10 ENCOUNTER — Inpatient Hospital Stay (HOSPITAL_COMMUNITY)
Admission: EM | Admit: 2016-04-10 | Discharge: 2016-04-13 | DRG: 872 | Disposition: A | Discharge status: Skilled Nursing Facility | Payer: Medicare Other | Attending: Internal Medicine | Admitting: Internal Medicine

## 2016-04-10 ENCOUNTER — Emergency Department (HOSPITAL_COMMUNITY): Payer: Medicare Other

## 2016-04-10 DIAGNOSIS — D72829 Elevated white blood cell count, unspecified: Secondary | ICD-10-CM

## 2016-04-10 DIAGNOSIS — J40 Bronchitis, not specified as acute or chronic: Secondary | ICD-10-CM | POA: Diagnosis present

## 2016-04-10 DIAGNOSIS — Z823 Family history of stroke: Secondary | ICD-10-CM

## 2016-04-10 DIAGNOSIS — I10 Essential (primary) hypertension: Secondary | ICD-10-CM | POA: Diagnosis present

## 2016-04-10 DIAGNOSIS — Z91030 Bee allergy status: Secondary | ICD-10-CM | POA: Diagnosis not present

## 2016-04-10 DIAGNOSIS — Z66 Do not resuscitate: Secondary | ICD-10-CM | POA: Diagnosis present

## 2016-04-10 DIAGNOSIS — R251 Tremor, unspecified: Secondary | ICD-10-CM | POA: Diagnosis present

## 2016-04-10 DIAGNOSIS — A419 Sepsis, unspecified organism: Principal | ICD-10-CM | POA: Diagnosis present

## 2016-04-10 DIAGNOSIS — Z79899 Other long term (current) drug therapy: Secondary | ICD-10-CM | POA: Diagnosis not present

## 2016-04-10 DIAGNOSIS — E876 Hypokalemia: Secondary | ICD-10-CM | POA: Diagnosis present

## 2016-04-10 DIAGNOSIS — F0390 Unspecified dementia without behavioral disturbance: Secondary | ICD-10-CM | POA: Diagnosis present

## 2016-04-10 DIAGNOSIS — R350 Frequency of micturition: Secondary | ICD-10-CM | POA: Diagnosis present

## 2016-04-10 DIAGNOSIS — R32 Unspecified urinary incontinence: Secondary | ICD-10-CM | POA: Diagnosis present

## 2016-04-10 DIAGNOSIS — I69320 Aphasia following cerebral infarction: Secondary | ICD-10-CM | POA: Diagnosis not present

## 2016-04-10 DIAGNOSIS — R1313 Dysphagia, pharyngeal phase: Secondary | ICD-10-CM | POA: Diagnosis present

## 2016-04-10 DIAGNOSIS — R1311 Dysphagia, oral phase: Secondary | ICD-10-CM | POA: Diagnosis present

## 2016-04-10 DIAGNOSIS — R509 Fever, unspecified: Secondary | ICD-10-CM

## 2016-04-10 DIAGNOSIS — R059 Cough, unspecified: Secondary | ICD-10-CM

## 2016-04-10 DIAGNOSIS — N390 Urinary tract infection, site not specified: Secondary | ICD-10-CM

## 2016-04-10 DIAGNOSIS — R05 Cough: Secondary | ICD-10-CM

## 2016-04-10 DIAGNOSIS — E7800 Pure hypercholesterolemia, unspecified: Secondary | ICD-10-CM | POA: Diagnosis present

## 2016-04-10 DIAGNOSIS — Z87891 Personal history of nicotine dependence: Secondary | ICD-10-CM

## 2016-04-10 DIAGNOSIS — R4182 Altered mental status, unspecified: Secondary | ICD-10-CM | POA: Diagnosis present

## 2016-04-10 DIAGNOSIS — I69920 Aphasia following unspecified cerebrovascular disease: Secondary | ICD-10-CM | POA: Diagnosis not present

## 2016-04-10 LAB — CBC WITH DIFFERENTIAL/PLATELET
BASOS ABS: 0 10*3/uL (ref 0.0–0.1)
Basophils Relative: 0 %
Eosinophils Absolute: 0 10*3/uL (ref 0.0–0.7)
Eosinophils Relative: 0 %
HEMATOCRIT: 43.4 % (ref 36.0–46.0)
HEMOGLOBIN: 14.6 g/dL (ref 12.0–15.0)
LYMPHS ABS: 1.1 10*3/uL (ref 0.7–4.0)
Lymphocytes Relative: 9 %
MCH: 31.6 pg (ref 26.0–34.0)
MCHC: 33.6 g/dL (ref 30.0–36.0)
MCV: 93.9 fL (ref 78.0–100.0)
Monocytes Absolute: 0.5 10*3/uL (ref 0.1–1.0)
Monocytes Relative: 4 %
NEUTROS ABS: 10.5 10*3/uL — AB (ref 1.7–7.7)
NEUTROS PCT: 87 %
Platelets: 182 10*3/uL (ref 150–400)
RBC: 4.62 MIL/uL (ref 3.87–5.11)
RDW: 13.2 % (ref 11.5–15.5)
WBC: 12.2 10*3/uL — AB (ref 4.0–10.5)

## 2016-04-10 LAB — I-STAT CG4 LACTIC ACID, ED
Lactic Acid, Venous: 3.29 mmol/L (ref 0.5–1.9)
Lactic Acid, Venous: 2.02 mmol/L (ref 0.5–1.9)

## 2016-04-10 LAB — URINE MICROSCOPIC-ADD ON

## 2016-04-10 LAB — URINALYSIS, ROUTINE W REFLEX MICROSCOPIC
Bilirubin Urine: NEGATIVE
Glucose, UA: NEGATIVE mg/dL
Hgb urine dipstick: NEGATIVE
KETONES UR: NEGATIVE mg/dL
NITRITE: POSITIVE — AB
PH: 5.5 (ref 5.0–8.0)
PROTEIN: NEGATIVE mg/dL
Specific Gravity, Urine: 1.024 (ref 1.005–1.030)

## 2016-04-10 LAB — COMPREHENSIVE METABOLIC PANEL
ALBUMIN: 3.8 g/dL (ref 3.5–5.0)
ALK PHOS: 105 U/L (ref 38–126)
ALT: 14 U/L (ref 14–54)
ANION GAP: 8 (ref 5–15)
AST: 21 U/L (ref 15–41)
BUN: 18 mg/dL (ref 6–20)
CALCIUM: 9.5 mg/dL (ref 8.9–10.3)
CHLORIDE: 107 mmol/L (ref 101–111)
CO2: 25 mmol/L (ref 22–32)
Creatinine, Ser: 0.87 mg/dL (ref 0.44–1.00)
GFR calc Af Amer: >60 mL/min (ref >60)
GFR calc non Af Amer: >60 mL/min (ref >60)
GLUCOSE: 154 mg/dL — AB (ref 65–99)
POTASSIUM: 3.1 mmol/L — AB (ref 3.5–5.1)
SODIUM: 140 mmol/L (ref 135–145)
Total Bilirubin: 0.9 mg/dL (ref 0.3–1.2)
Total Protein: 7.6 g/dL (ref 6.5–8.1)

## 2016-04-10 MED ORDER — VANCOMYCIN HCL IN DEXTROSE 1-5 GM/200ML-% IV SOLN
1000.0000 mg | Freq: Once | INTRAVENOUS | Status: AC
  Administered 2016-04-10: 1000 mg via INTRAVENOUS
  Filled 2016-04-10: qty 200

## 2016-04-10 MED ORDER — SODIUM CHLORIDE 0.9 % IV BOLUS (SEPSIS)
1000.0000 mL | Freq: Once | INTRAVENOUS | Status: AC
  Administered 2016-04-10: 1000 mL via INTRAVENOUS

## 2016-04-10 MED ORDER — SODIUM CHLORIDE 0.9 % IV BOLUS (SEPSIS)
250.0000 mL | Freq: Once | INTRAVENOUS | Status: AC
  Administered 2016-04-10: 250 mL via INTRAVENOUS

## 2016-04-10 MED ORDER — ACETAMINOPHEN 325 MG PO TABS
650.0000 mg | ORAL_TABLET | Freq: Once | ORAL | Status: AC
  Administered 2016-04-10: 650 mg via ORAL
  Filled 2016-04-10: qty 2

## 2016-04-10 MED ORDER — PIPERACILLIN-TAZOBACTAM 3.375 G IVPB 30 MIN
3.3750 g | Freq: Once | INTRAVENOUS | Status: AC
  Administered 2016-04-10: 3.375 g via INTRAVENOUS
  Filled 2016-04-10: qty 50

## 2016-04-10 MED ORDER — PIPERACILLIN-TAZOBACTAM 3.375 G IVPB
3.3750 g | Freq: Three times a day (TID) | INTRAVENOUS | Status: DC
  Filled 2016-04-10: qty 50

## 2016-04-10 MED ORDER — VANCOMYCIN HCL 500 MG IV SOLR: 500.0000 mg | Freq: Two times a day (BID) | INTRAVENOUS | Status: DC

## 2016-04-10 NOTE — ED Notes (Signed)
Bed: [id number] Expected date:  Expected time:  Means of arrival:  Comments: 66F/n/v

## 2016-04-10 NOTE — H&P (Signed)
History and Physical    Brittany Powers:[id number] DOB: May 21, 1931 DOA: 04/10/2016  PCP: Brittany Maudlin, MD   Patient coming from: Memory Care Unit at [facility]  Chief Complaint: Altered mental status  HPI: Brittany Powers is a 82 y.o. woman with a history of CVA/TIA and aphasia (nonverbal at baseline), dementia, HTN, and dyslipidemia who is accompanied by three siblings who was reportedly in her baseline state of health until the day of presentation.  She typically smiles at family and eats well.  Family was notified today of a change in her baseline.  Family observed her to be more withdrawn with her eyes closed.  She has also had decreased PO intake.  The patient was transferred to the ED at [facility] for further evaluation.  ED Course: Fever to 102 was not identified until her arrival in the ED.  WBC count also elevated to 12.  Lactic acid level elevated to 3.29.  Code Sepsis initiated.  The patient received volume resuscitation 30cc/kg in the ED per protocol.  Broad spectrum antibiotics including vancomycin and zosyn were given in the ED.  U/A subsequently suggests UTI as source of infection.  Blood and urine cultures are pending.  The patient is not hypoxic, hypotensive, or in renal failure.  Hospitalist asked to admit for ongoing management.  Review of Systems: As per HPI otherwise 10 point review of systems negative.    Past Medical History:  Diagnosis Date  . CVA (cerebral infarction)   . Dementia   . High cholesterol   . Hypertension   . Incontinent of urine     Past Surgical History:  Procedure Laterality Date  . NO PAST SURGERIES       reports that she quit smoking about 15 months ago. Her smoking use included Cigarettes. She has never used smokeless tobacco. She reports that she does not drink alcohol or use drugs.  She is not married.  She does not have any adult children.  Her siblings are her next of kin.  Allergies  Allergen Reactions  . Bee Venom Itching  and Swelling    Unknown swelling location     FAMILY HISTORY: Both of her parents also had strokes.  One brother and one sister died of sudden cardiac arrest.   Prior to Admission medications   Medication Sig Start Date End Date Taking? Authorizing Provider  acetaminophen (TYLENOL) 500 MG tablet Take 500 mg by mouth every 4 (four) hours as needed for mild pain, moderate pain, fever or headache.    Yes Historical Provider, MD  alum & mag hydroxide-simeth (MINTOX) 200-200-20 MG/5ML suspension Take 30 mLs by mouth as needed for indigestion or heartburn.   Yes Historical Provider, MD  calcium carbonate (OS-CAL - DOSED IN MG OF ELEMENTAL CALCIUM) 1250 (500 CA) MG tablet Take 1 tablet (500 mg of elemental calcium total) by mouth daily with breakfast. 02/09/15  Yes Marjan Rabbani, MD  donepezil (ARICEPT) 5 MG tablet Take 5 mg by mouth at bedtime.    Yes Historical Provider, MD  fish oil-omega-3 fatty acids 1000 MG capsule Take 1 g by mouth 2 (two) times daily.   Yes Historical Provider, MD  guaifenesin (ROBITUSSIN) 100 MG/5ML syrup Take 200 mg by mouth every 6 (six) hours as needed for cough.    Yes Historical Provider, MD  hydrochlorothiazide (HYDRODIURIL) 12.5 MG tablet Take 12.5 mg by mouth daily with breakfast.   Yes Historical Provider, MD  loperamide (IMODIUM) 2 MG capsule Take 2 mg by  mouth as needed for diarrhea or loose stools.   Yes Historical Provider, MD  magnesium hydroxide (MILK OF MAGNESIA) 400 MG/5ML suspension Take 30 mLs by mouth at bedtime as needed for mild constipation.    Yes Historical Provider, MD  neomycin-bacitracin-polymyxin (NEOSPORIN) OINT Apply 1 application topically daily as needed for wound care.   Yes Historical Provider, MD  polyethylene glycol (MIRALAX / GLYCOLAX) packet Take 17 g by mouth daily with breakfast.    Yes Historical Provider, MD  Skin Protectants, Misc. (BAZA PROTECT EX) Apply 1 application topically as needed (with every incontinent episode). Apply to  groin/buttocks   Yes Historical Provider, MD  traMADol (ULTRAM) 50 MG tablet Take 50 mg by mouth every 6 (six) hours as needed for moderate pain.    Yes Historical Provider, MD  verapamil (CALAN) 80 MG tablet Take 80 mg by mouth 3 (three) times daily.    Yes Historical Provider, MD  Vitamin D, Ergocalciferol, (DRISDOL) 50000 UNITS CAPS capsule Take 50,000 Units by mouth every 30 (thirty) days.    Yes Historical Provider, MD    Physical Exam: Vitals:   04/10/16 2030 04/10/16 2100 04/10/16 2130 04/10/16 2200  BP: 148/86 150/80 155/75 140/82  Pulse: 81 80 76 84  Resp: 16 19 23 18   Temp:      TempSrc:      SpO2: 96% 96% 100% 98%  Weight:      Height:          Constitutional: NAD, calm, appears comfortable, eyes are open now, which is an improvement per family Vitals:   04/10/16 2030 04/10/16 2100 04/10/16 2130 04/10/16 2200  BP: 148/86 150/80 155/75 140/82  Pulse: 81 80 76 84  Resp: 16 19 23 18   Temp:      TempSrc:      SpO2: 96% 96% 100% 98%  Weight:      Height:       Eyes: PERRL, lids and conjunctivae normal ENMT: Mucous membranes are moist. Posterior pharynx clear of any exudate or lesions.  Neck: normal appearance, supple, no masses Respiratory: clear to auscultation bilaterally, no wheezing, no crackles. Normal respiratory effort. No accessory muscle use.  Cardiovascular: Normal rate, regular rhythm, no murmurs / rubs / gallops. No extremity edema. 2+ pedal pulses. GI: abdomen is soft and compressible.  It is mildly distended but she does not grimace with palpation.  She has LLQ fullness but I do not palpate a discrete mass.  Bowel sounds are present. Musculoskeletal:  No joint deformity in upper and lower extremities. Good ROM, no contractures. Normal muscle tone.  Skin: no rashes, warm and dry Neurologic: Tremor in her right upper extremity, which family says is new.  Otherwise, no other focal deficits different from known baseline. Psychiatric: Flat affect.  Insight  impaired by history of dementia.    Labs on Admission: I have personally reviewed following labs and imaging studies  CBC:  Recent Labs Lab 04/10/16 1912  WBC 12.2*  NEUTROABS 10.5*  HGB 14.6  HCT 43.4  MCV 93.9  PLT 182   Basic Metabolic Panel:  Recent Labs Lab 04/10/16 1912  NA 140  K 3.1*  CL 107  CO2 25  GLUCOSE 154*  BUN 18  CREATININE 0.87  CALCIUM 9.5   GFR: Estimated Creatinine Clearance: 46.8 mL/min (by C-G formula based on SCr of 0.87 mg/dL). Liver Function Tests:  Recent Labs Lab 04/10/16 1912  AST 21  ALT 14  ALKPHOS 105  BILITOT 0.9  PROT 7.6  ALBUMIN 3.8   Urine analysis:    Component Value Date/Time   COLORURINE YELLOW 04/10/2016 1912   APPEARANCEUR CLOUDY (A) 04/10/2016 1912   LABSPEC 1.024 04/10/2016 1912   PHURINE 5.5 04/10/2016 1912   GLUCOSEU NEGATIVE 04/10/2016 1912   HGBUR NEGATIVE 04/10/2016 1912   BILIRUBINUR NEGATIVE 04/10/2016 1912   KETONESUR NEGATIVE 04/10/2016 1912   PROTEINUR NEGATIVE 04/10/2016 1912   NITRITE POSITIVE (A) 04/10/2016 1912   LEUKOCYTESUR SMALL (A) 04/10/2016 1912   Sepsis Labs:  Lactic acid level 3.29  Radiological Exams on Admission: Dg Chest 2 View  Result Date: 04/10/2016 CLINICAL DATA:  Less reactive the normal and drooling. EXAM: CHEST  2 VIEW COMPARISON:  07/01/2015 FINDINGS: Low volume film. Cardiopericardial silhouette is at upper limits of normal for size. Interstitial markings are diffusely coarsened with chronic features. Face obscures the left apex. Bones are diffusely demineralized. Telemetry leads overlie the chest. IMPRESSION: Low volume film with chronic interstitial coarsening. No edema or focal lung consolidation. Electronically Signed   By: Kennith CenterEric  Mansell M.D.   On: 04/10/2016 19:51     Assessment/Plan Principal Problem:   Sepsis (HCC) Active Problems:   HTN (hypertension)   Aphasia as late effect of cerebrovascular accident   Dementia   UTI (lower urinary tract infection)    Leukocytosis   Fever      Sepsis secondary to UTI --Will change IV antibiotic coverage to Rocephin, per protocol, since urine has been identified as the suspected source --Blood and urine cultures pending --Aggressive volume resuscitation ordered in the ED per protocol, continue NS at 75cc/hr for now, hold diuretic for at least the first 24 hours --Repeat lactic acid level and procalcitonin pending --Repeat CBC in AM to follow WBC trend --Acetaminophen as needed for fever  HTN --Hold HCTZ for now, continue verapamil  Hypokalemia --Replacement ordered, repeat BMP in the AM  RUE tremor --Monitor.  Family advised that patient may have chills/rigors if she is bacteremic.  If not improved in the next 24 hours, consider neurology evaluation.     DVT prophylaxis: SCDs Code Status: DNR/DNI Family Communication: 3 siblings and one sister-in-law present in the ED, at time of admission Disposition Plan: To be determined Consults called: NONE Admission status: Inpatient, telemetry.  Would not expect discharge before Friday.   TIME SPENT: 65 minutes   Jerene Bearsarter,Jessamine Barcia Harrison MD [facility] Pager [phone]  If 7PM-7AM, please contact night-coverage [email or web address] Password [email or web address]  04/10/2016, 11:01 PM

## 2016-04-10 NOTE — ED Triage Notes (Signed)
[facility] observed this normally non-verbal pt. To be "less reactive than normal and drooling".  She arrives here in no distress and follows my instruction to hold thermometer under her tongue. Her skin is normal, warm and dry and she is breathing normally.

## 2016-04-10 NOTE — ED Notes (Signed)
Called to give report to nurse. Secretary stated nurse will call back. 

## 2016-04-10 NOTE — Progress Notes (Signed)
Pharmacy Antibiotic Note  Brittany Powers is a 82 y.o. [facility] with AMS, fever, and suspected sepsis.  Pharmacy has been consulted for Vancomycin and Zosyn dosing.  Lactic acid: 3.29 SCr 0.87 with CrCl ~ 47 ml/min  Plan: Zosyn 3.375g IV Q8H infused over 4hrs. Vancomycin 1g IV stat, then 500 mg IV q12h. Measure Vanc trough at steady state. Follow up renal fxn, culture results, and clinical course.   Height: 5\' 3"  (160 cm) Weight: 154 lb (69.9 kg) IBW/kg (Calculated) : 52.4  Temp (24hrs), Avg:100.6 F (38.1 C), Min:98.9 F (37.2 C), Max:102.2 F (39 C)   Recent Labs Lab 04/10/16 1912 04/10/16 2011  WBC 12.2*  --   CREATININE 0.87  --   LATICACIDVEN  --  3.29*    Estimated Creatinine Clearance: 46.8 mL/min (by C-G formula based on SCr of 0.87 mg/dL).    Allergies  Allergen Reactions  . Bee Venom Itching and Swelling    Unknown swelling location     Antimicrobials this admission: 8/16 Zosyn >>  8/16 Vancomycin >>   Dose adjustments this admission:  Microbiology results: 8/16 BCx: sent 8/16 UA: many bacteria, + nitrite, small leuk, +WBC 8/16 UCx: sent  Thank you for allowing pharmacy to be a part of this patient's care.  Lynann Beaverhristine Nathanel Tallman PharmD, BCPS Pager [phone] 04/10/2016 9:31 PM

## 2016-04-10 NOTE — ED Notes (Addendum)
Notified EDP,Jacubowitz,MD., Pt. I-stat CG4 Lactic acid 2.02 and RN,Jeneen made aware.

## 2016-04-10 NOTE — ED Notes (Addendum)
Notified EDP,Pfeiffer,MD. Pt. I-stat CG4 Lactic acid 3.29 and RN,Jeneen made aware.

## 2016-04-10 NOTE — ED Provider Notes (Signed)
Level V caveat dementia Patient noted to be more sleepy today by family members visiting her skilled nursing facility. She's also been coughing.Marland Kitchen. She was last seen by them 2 days ago and appeared "her normal self" then. Patient noted to be febrile. Alert nontoxic-appearing. Repeat sepsis assessment completed.    Doug SouSam Donyell Ding, MD 04/10/16 2209

## 2016-04-10 NOTE — ED Provider Notes (Signed)
[facility]-EMERGENCY DEPT Provider Note   CSN: [id number] Arrival date & time: 04/10/16  1840  By signing my name below, I, Majel HomerPeyton Lee, attest that this documentation has been prepared under the direction and in the presence of non-physician practitioner, Elpidio AnisShari Griffen Frayne, PA-C. Electronically Signed: Majel HomerPeyton Lee, Scribe. 04/10/2016. 9:11 PM.  History   Chief Complaint Chief Complaint  Patient presents with  . Altered Mental Status   The history is provided by a relative. No language interpreter was used.   Level V Caveat due to Pt Nonverbal   HPI Comments: Brittany Powers is a 82 y.o. female with PMHx of CVA, dementia, HLD and HTN, who presents to the Emergency Department by EMS and family with a complaint of gradually improving, altered mental status that began yesterday. Pt's daughter reports [facility] due to being less reactive and alert, "slobbering," "shaking," and "not acting herself." She notes associated fever (TMAX 102.2), cough, urinary frequency and change in appetite that began today. Per family, pt was acting more like herself this morning and looks more alert than she has been now in the ED. Pt's daughter denies weakness on one particular side, vomiting, pain and hx of DM.   Past Medical History:  Diagnosis Date  . CVA (cerebral infarction)   . Dementia   . High cholesterol   . Hypertension   . Incontinent of urine    Patient Active Problem List   Diagnosis Date Noted  . Epidural hematoma (HCC) 02/08/2015  . Fall at nursing home 02/08/2015  . HTN (hypertension) 02/08/2015  . Aphasia as late effect of cerebrovascular accident 02/08/2015  . Dementia 02/08/2015   No past surgical history on file.  OB History    No data available     Home Medications    Prior to Admission medications   Medication Sig Start Date End Date Taking? Authorizing Provider  acetaminophen (TYLENOL) 500 MG tablet Take 500 mg by mouth every 4  (four) hours as needed for mild pain, moderate pain, fever or headache.    Yes Historical Provider, MD  alum & mag hydroxide-simeth (MINTOX) 200-200-20 MG/5ML suspension Take 30 mLs by mouth as needed for indigestion or heartburn.   Yes Historical Provider, MD  calcium carbonate (OS-CAL - DOSED IN MG OF ELEMENTAL CALCIUM) 1250 (500 CA) MG tablet Take 1 tablet (500 mg of elemental calcium total) by mouth daily with breakfast. 02/09/15  Yes Marjan Rabbani, MD  donepezil (ARICEPT) 5 MG tablet Take 5 mg by mouth at bedtime.    Yes Historical Provider, MD  fish oil-omega-3 fatty acids 1000 MG capsule Take 1 g by mouth 2 (two) times daily.   Yes Historical Provider, MD  guaifenesin (ROBITUSSIN) 100 MG/5ML syrup Take 200 mg by mouth every 6 (six) hours as needed for cough.    Yes Historical Provider, MD  hydrochlorothiazide (HYDRODIURIL) 12.5 MG tablet Take 12.5 mg by mouth daily with breakfast.   Yes Historical Provider, MD  loperamide (IMODIUM) 2 MG capsule Take 2 mg by mouth as needed for diarrhea or loose stools.   Yes Historical Provider, MD  magnesium hydroxide (MILK OF MAGNESIA) 400 MG/5ML suspension Take 30 mLs by mouth at bedtime as needed for mild constipation.    Yes Historical Provider, MD  neomycin-bacitracin-polymyxin (NEOSPORIN) OINT Apply 1 application topically daily as needed for wound care.   Yes Historical Provider, MD  polyethylene glycol (MIRALAX / GLYCOLAX) packet Take 17 g by mouth daily  with breakfast.    Yes Historical Provider, MD  Skin Protectants, Misc. (BAZA PROTECT EX) Apply 1 application topically as needed (with every incontinent episode). Apply to groin/buttocks   Yes Historical Provider, MD  traMADol (ULTRAM) 50 MG tablet Take 50 mg by mouth every 6 (six) hours as needed for moderate pain.    Yes Historical Provider, MD  verapamil (CALAN) 80 MG tablet Take 80 mg by mouth 3 (three) times daily.    Yes Historical Provider, MD  Vitamin D, Ergocalciferol, (DRISDOL) 50000 UNITS  CAPS capsule Take 50,000 Units by mouth every 30 (thirty) days.    Yes Historical Provider, MD    Family History No family history on file.  Social History Social History  Substance Use Topics  . Smoking status: Former Smoker    Types: Cigarettes    Quit date: 12/26/2014  . Smokeless tobacco: Never Used     Comment: " smoked most of her life "  . Alcohol use No   Allergies   Bee venom  Review of Systems Review of Systems  Unable to perform ROS: Mental status change   Physical Exam Updated Vital Signs BP 159/74 (BP Location: Left Arm)   Pulse 66   Temp 102.2 F (39 C) (Rectal)   Resp 20   Ht 5\' 3"  (1.6 m)   Wt 154 lb (69.9 kg)   SpO2 96%   BMI 27.28 kg/m   Physical Exam  Constitutional: She is oriented to person, place, and time. She appears well-developed and well-nourished.  HENT:  Head: Normocephalic and atraumatic.  Eyes: Conjunctivae are normal. Pupils are equal, round, and reactive to light. Right eye exhibits no discharge. Left eye exhibits no discharge. No scleral icterus.  Neck: Normal range of motion. No JVD present. No tracheal deviation present.  Pulmonary/Chest: Effort normal and breath sounds normal. No stridor.  Poor respiratory effort  Abdominal: Soft. She exhibits no distension.  Musculoskeletal:  Left foot is mildly erythematous and warm to the touch; no skin lesions; no inter phalangeal abnormalities   Neurological: She is alert and oriented to person, place, and time. Coordination normal.  Nonverbal; follows commands poorly; awake and alert  Psychiatric: She has a normal mood and affect. Her behavior is normal. Judgment and thought content normal.  Nursing note and vitals reviewed.  ED Treatments / Results  Labs (all labs ordered are listed, but only abnormal results are displayed) Labs Reviewed  COMPREHENSIVE METABOLIC PANEL - Abnormal; Notable for the following:       Result Value   Potassium 3.1 (*)    Glucose, Bld 154 (*)    All other  components within normal limits  CBC WITH DIFFERENTIAL/PLATELET - Abnormal; Notable for the following:    WBC 12.2 (*)    Neutro Abs 10.5 (*)    All other components within normal limits  I-STAT CG4 LACTIC ACID, ED - Abnormal; Notable for the following:    Lactic Acid, Venous 3.29 (*)    All other components within normal limits  CULTURE, BLOOD (ROUTINE X 2)  CULTURE, BLOOD (ROUTINE X 2)  URINE CULTURE  URINALYSIS, ROUTINE W REFLEX MICROSCOPIC (NOT AT [facility])    EKG  EKG Interpretation None       Radiology Dg Chest 2 View  Result Date: 04/10/2016 CLINICAL DATA:  Less reactive the normal and drooling. EXAM: CHEST  2 VIEW COMPARISON:  07/01/2015 FINDINGS: Low volume film. Cardiopericardial silhouette is at upper limits of normal for size. Interstitial markings are diffusely coarsened  with chronic features. Face obscures the left apex. Bones are diffusely demineralized. Telemetry leads overlie the chest. IMPRESSION: Low volume film with chronic interstitial coarsening. No edema or focal lung consolidation. Electronically Signed   By: Kennith Center M.D.   On: 04/10/2016 19:51   Procedures Procedures  DIAGNOSTIC STUDIES:  Oxygen Saturation is 96% on RA, normal by my interpretation.    COORDINATION OF CARE:  9:07 PM Discussed treatment plan with pt and family at bedside and they agreed to plan.  Medications Ordered in ED Medications - No data to display  Initial Impression / Assessment and Plan / ED Course  I have reviewed the triage vital signs and the nursing notes.  Pertinent labs & imaging results that were available during my care of the patient were reviewed by me and considered in my medical decision making (see chart for details).  Clinical Course    Patient nonverbal at baseline, here with family who reports altered mental status earlier today, improved now. Patient also found to have fever, elevated lactate (3.24). Code sepsis called, recommended IV bolus of 30 mL/kg  ordered. Antibiotics for undifferentiated sepsis started.   The patient is seen and examined by Dr. Ethelda Chick. Admission discussed with Dr. Michael Litter, who accepts the patient onto hospitalist service.  Family updated on plan. All questions answered.     I personally performed the services described in this documentation, which was scribed in my presence. The recorded information has been reviewed and is accurate.   Final Clinical Impressions(s) / ED Diagnoses   Final diagnoses:  None   1. Sepsis  New Prescriptions New Prescriptions   No medications on file     Elpidio Anis, Cordelia Poche 04/10/16 2300    Doug Sou, MD 04/11/16 [phone]

## 2016-04-11 ENCOUNTER — Inpatient Hospital Stay (HOSPITAL_COMMUNITY): Payer: Medicare Other

## 2016-04-11 ENCOUNTER — Encounter (HOSPITAL_COMMUNITY): Payer: Self-pay | Admitting: Rehabilitation

## 2016-04-11 DIAGNOSIS — A419 Sepsis, unspecified organism: Principal | ICD-10-CM

## 2016-04-11 DIAGNOSIS — I69920 Aphasia following unspecified cerebrovascular disease: Secondary | ICD-10-CM

## 2016-04-11 DIAGNOSIS — F0390 Unspecified dementia without behavioral disturbance: Secondary | ICD-10-CM

## 2016-04-11 LAB — BASIC METABOLIC PANEL
Anion gap: 7 (ref 5–15)
BUN: 15 mg/dL (ref 6–20)
CHLORIDE: 113 mmol/L — AB (ref 101–111)
CO2: 24 mmol/L (ref 22–32)
CREATININE: 0.71 mg/dL (ref 0.44–1.00)
Calcium: 8.6 mg/dL — ABNORMAL LOW (ref 8.9–10.3)
GFR calc non Af Amer: >60 mL/min (ref >60)
GLUCOSE: 109 mg/dL — AB (ref 65–99)
Potassium: 3.2 mmol/L — ABNORMAL LOW (ref 3.5–5.1)
Sodium: 144 mmol/L (ref 135–145)

## 2016-04-11 LAB — CBC
HEMATOCRIT: 41.5 % (ref 36.0–46.0)
HEMOGLOBIN: 14 g/dL (ref 12.0–15.0)
MCH: 32 pg (ref 26.0–34.0)
MCHC: 33.7 g/dL (ref 30.0–36.0)
MCV: 95 fL (ref 78.0–100.0)
Platelets: 151 10*3/uL (ref 150–400)
RBC: 4.37 MIL/uL (ref 3.87–5.11)
RDW: 13.1 % (ref 11.5–15.5)
WBC: 9.1 10*3/uL (ref 4.0–10.5)

## 2016-04-11 LAB — PROCALCITONIN

## 2016-04-11 LAB — PROTIME-INR
INR: 1.03
Prothrombin Time: 13.5 seconds (ref 11.4–15.2)

## 2016-04-11 LAB — LACTIC ACID, PLASMA: Lactic Acid, Venous: 2 mmol/L (ref 0.5–1.9)

## 2016-04-11 LAB — MRSA PCR SCREENING: MRSA BY PCR: NEGATIVE

## 2016-04-11 MED ORDER — DONEPEZIL HCL 10 MG PO TABS
5.0000 mg | ORAL_TABLET | Freq: Every day | ORAL | Status: DC
  Administered 2016-04-11 – 2016-04-12 (×3): 5 mg via ORAL
  Filled 2016-04-11 (×3): qty 1

## 2016-04-11 MED ORDER — ONDANSETRON HCL 4 MG PO TABS: 4.0000 mg | ORAL_TABLET | Freq: Four times a day (QID) | ORAL | Status: DC | PRN

## 2016-04-11 MED ORDER — SODIUM CHLORIDE 0.9% FLUSH
3.0000 mL | Freq: Two times a day (BID) | INTRAVENOUS | Status: DC
  Administered 2016-04-11 – 2016-04-12 (×2): 3 mL via INTRAVENOUS

## 2016-04-11 MED ORDER — ENOXAPARIN SODIUM 40 MG/0.4ML ~~LOC~~ SOLN
40.0000 mg | SUBCUTANEOUS | Status: DC
  Administered 2016-04-11 – 2016-04-12 (×2): 40 mg via SUBCUTANEOUS
  Filled 2016-04-11 (×2): qty 0.4

## 2016-04-11 MED ORDER — ONDANSETRON HCL 4 MG/2ML IJ SOLN: 4.0000 mg | Freq: Four times a day (QID) | INTRAMUSCULAR | Status: DC | PRN

## 2016-04-11 MED ORDER — POTASSIUM CHLORIDE 20 MEQ/15ML (10%) PO SOLN
40.0000 meq | Freq: Once | ORAL | Status: AC
  Administered 2016-04-11: 40 meq via ORAL
  Filled 2016-04-11: qty 30

## 2016-04-11 MED ORDER — VERAPAMIL HCL 80 MG PO TABS
80.0000 mg | ORAL_TABLET | Freq: Three times a day (TID) | ORAL | Status: DC
  Administered 2016-04-11 – 2016-04-13 (×8): 80 mg via ORAL
  Filled 2016-04-11 (×10): qty 1

## 2016-04-11 MED ORDER — DEXTROSE 5 % IV SOLN
2.0000 g | Freq: Every day | INTRAVENOUS | Status: DC
  Administered 2016-04-11: 2 g via INTRAVENOUS
  Filled 2016-04-11: qty 2

## 2016-04-11 MED ORDER — POLYETHYLENE GLYCOL 3350 17 G PO PACK
17.0000 g | PACK | Freq: Every day | ORAL | Status: DC
  Administered 2016-04-11: 17 g via ORAL
  Filled 2016-04-11: qty 1

## 2016-04-11 MED ORDER — SODIUM CHLORIDE 0.9 % IV SOLN
INTRAVENOUS | Status: DC
  Administered 2016-04-11 – 2016-04-12 (×2): via INTRAVENOUS

## 2016-04-11 MED ORDER — DEXTROSE 5 % IV SOLN
2.0000 g | Freq: Once | INTRAVENOUS | Status: AC
  Administered 2016-04-11: 2 g via INTRAVENOUS
  Filled 2016-04-11: qty 2

## 2016-04-11 MED ORDER — TRAMADOL HCL 50 MG PO TABS: 50.0000 mg | ORAL_TABLET | Freq: Four times a day (QID) | ORAL | Status: DC | PRN

## 2016-04-11 MED ORDER — ACETAMINOPHEN 500 MG PO TABS
500.0000 mg | ORAL_TABLET | ORAL | Status: DC | PRN
  Administered 2016-04-11: 500 mg via ORAL
  Filled 2016-04-11: qty 1

## 2016-04-11 NOTE — ED Notes (Signed)
Called nurse to give report. Secretary stated nurse will call back.

## 2016-04-11 NOTE — Progress Notes (Signed)
Pharmacy Antibiotic Note  Brittany Powers is a 82 y.o. female admitted on 04/10/2016 with sepsis and UTI.  Pharmacy has been consulted for Ceftriaxone dosing.  Plan:  Ceftriaxone 2gm iv q24hr   Height: 5\' 3"  (160 cm) Weight: 154 lb (69.9 kg) IBW/kg (Calculated) : 52.4  Temp (24hrs), Avg:99 F (37.2 C), Min:97.5 F (36.4 C), Max:102.2 F (39 C)   Recent Labs Lab 04/10/16 1912 04/10/16 2011 04/10/16 2307 04/11/16 0149  WBC 12.2*  --   --  9.1  CREATININE 0.87  --   --  0.71  LATICACIDVEN  --  3.29* 2.02* 2.0*    Estimated Creatinine Clearance: 50.8 mL/min (by C-G formula based on SCr of 0.8 mg/dL).    Allergies  Allergen Reactions  . Bee Venom Itching and Swelling    Unknown swelling location     Antimicrobials this admission: 8/16 Zosyn >> 8/17 8/16 Vancomycin >> 8/17 8/17 ceftriaxone >>   Dose adjustments this admission: -  Microbiology results: 8/16 BCx: sent 8/16 UA: many bacteria, + nitrite, small leuk, +WBC 8/16 UCx: sent   Thank you for allowing pharmacy to be a part of this patient's care.  Aleene DavidsonGrimsley Jr, Chancey Cullinane Crowford 04/11/2016 6:33 AM

## 2016-04-11 NOTE — Clinical Social Work Note (Signed)
Clinical Social Work Assessment  Patient Details  Name: Brittany Powers MRN: [id number] Date of Birth: [birth year]  Date of referral:  04/11/16               Reason for consult:  Facility Placement                Permission sought to share information with:  Facility Sport and exercise psychologist, Family Supports Permission granted to share information::     Name::        Agency::     Relationship::  Daughter:  Contact Information:  Drema Balzarine: [phone]  Housing/Transportation Living arrangements for the past 2 months:  [facility] ([facility]) Source of Information:  Other (Comment Required) (Sister) Patient Interpreter Needed:  None Criminal Activity/Legal Involvement Pertinent to Current Situation/Hospitalization:  No - Comment as needed Significant Relationships:  Siblings, [employer] Lives with:  Facility Resident Do you feel safe going back to the place where you live?  Yes Need for family participation in patient care:  Yes (Comment)  Care giving concerns:  Patient is non verbal at baseline, patient sister provided information. Before the patient went to ALF she was verbal and communicated well. She reports patient has been living at [facility], memory care for a year. She reports the patient uses wheelchair at baseline. She is concerned the facility keeps the patient in the wheelchair too long because her feet are always swollen, falls asleep in the chair, and worried about her falling. She reports the patient participates in activities such as kickball, and bingo. She reports patient will return to facility at disposition.    Social Worker assessment / plan: LCSWA met with patient and sister at bedside. Patient sister reports the patient is a long term resident at [facility] memory [facility]. The patient has been thriving their as she participates in the daily activities most days. Patient sister does have concerns with her care in regards to the wheelchair. LCSWA  encouraged the patient to address her concerns with director at facility.  Patient sister plans to follow up. Patient has a strong support system, her siblings are very involved in her care.  Plan: Assist patient with disposition. LCSWA contacted facility contact Alfonso Ellis: [phone]. She will inform AC to return call.   Employment status:  Retired, Disabled (Comment on whether or not currently receiving Disability) Insurance information:  Medicaid In [place], [place] PT Recommendations:  Not assessed at this time Information / Referral to community resources:     Patient/Family's Response to care:  Agreeable  Patient/Family's Understanding of and Emotional Response to Diagnosis, Current Treatment, and Prognosis:  "We (Siblings) rotate throughout the day to be with her at the facility everyday." Patient sister is well versed in patient treatment and understands patient will continue to need 24 hrs. Supervision and care.   Emotional Assessment Appearance:  Appears stated age Attitude/Demeanor/Rapport:    Affect (typically observed):  Quiet Orientation:  Oriented to Self Alcohol / Substance use:  Not Applicable Psych involvement (Current and /or in the community):  No (Comment)  Discharge Needs  Concerns to be addressed:  Care Coordination Readmission within the last 30 days:  No Current discharge risk:  None Barriers to Discharge:  No Barriers Identified   Lia Hopping, LCSW 04/11/2016, 2:23 PM

## 2016-04-11 NOTE — Progress Notes (Signed)
PROGRESS NOTE    Brittany Powers  WUJ:[id number] DOB: 11/03/1930 DOA: 04/10/2016 PCP: Fredirick MaudlinHAWKINS,EDWARD L, MD    Brief Narrative :Brittany HaysBettye G Powers is a 82 y.o. woman with a history of CVA/TIA and aphasia (nonverbal at baseline), dementia, HTN, and dyslipidemia who is accompanied by three siblings who was reportedly in her baseline state of health until the day of presentation.  She typically smiles at family and eats well.  Family was notified today of a change in her baseline.  Family observed her to be more withdrawn with her eyes closed.  She has also had decreased PO intake.  [facility].  ED Course: Fever to 102 was not identified until her arrival in the ED.  WBC count also elevated to 12.  Lactic acid level elevated to 3.29.  Code Sepsis initiated.  The patient received volume resuscitation 30cc/kg in the ED per protocol.  Broad spectrum antibiotics including vancomycin and zosyn were given in the ED.  U/A subsequently suggests UTI as source of infection.  Blood and urine cultures are pending.  The patient is not hypoxic, hypotensive, or in renal failure.  Hospitalist asked to admit for ongoing management.   Assessment & Plan:   Principal Problem:   Sepsis (HCC) Active Problems:   HTN (hypertension)   Aphasia as late effect of cerebrovascular accident   Dementia   UTI (lower urinary tract infection)   Leukocytosis   Fever   Sepsis secondary to UTI --Continue with IV ceftriaxone  --Blood and urine cultures pending --Repeat lactic acid level trending down peak to 3.29 and procalcitonin negative --Acetaminophen as needed for fever -will ask speech for swallow evaluation to assess for aspiration risk.   HTN --Hold HCTZ for now, continue verapamil  Hypokalemia --Replacement ordered  RUE tremor --Monitor. Appears to have  resolved.   CVA history with resultant aphasia;  Had also Right subdural Hematoma 2016. PCP to  decide when to resume aspirin for stroke prevention.    DVT prophylaxis: lovenox Code Status: DNR Family Communication: sister at bedside.  [facility]   Consultants:   none   Procedures:   none   Antimicrobials:   Ceftriaxone   Subjective: Just had a watery BM. Received miralax this morning.  Non verbal.  Sister at bedside.   Objective: Vitals:   04/11/16 0000 04/11/16 0034 04/11/16 0220 04/11/16 0513  BP: 127/72  (!) 151/71 139/84  Pulse: 79  73 78  Resp: 21  19 18   Temp:  98.4 F (36.9 C) 97.5 F (36.4 C) 97.9 F (36.6 C)  TempSrc:  Rectal Oral Oral  SpO2: 96%  97% 97%  Weight:      Height:        Intake/Output Summary (Last 24 hours) at 04/11/16 0959 Last data filed at 04/11/16 0947  Gross per 24 hour  Intake             2860 ml  Output                1 ml  Net             2859 ml   Filed Weights   04/10/16 2011  Weight: 69.9 kg (154 lb)    Examination:  General exam: Appears calm and comfortable non verbal.  Respiratory system: Clear to auscultation. Respiratory effort normal. Cardiovascular system: S1 & S2 heard, RRR. No JVD, murmurs, rubs, gallops or  clicks. No pedal edema. Gastrointestinal system: Abdomen is nondistended, soft and nontender. No organomegaly or masses felt. Normal bowel sounds heard. Central nervous system: Alert and oriented. No focal neurological deficits. Extremities: Symmetric 5 x 5 power. Skin: No rashes, lesions or ulcers Psychiatry: Judgement and insight appear normal. Mood & affect appropriate.     Data Reviewed: I have personally reviewed following labs and imaging studies  CBC:  Recent Labs Lab 04/10/16 1912 04/11/16 0149  WBC 12.2* 9.1  NEUTROABS 10.5*  --   HGB 14.6 14.0  HCT 43.4 41.5  MCV 93.9 95.0  PLT 182 151   Basic Metabolic Panel:  Recent Labs Lab 04/10/16 1912 04/11/16 0149  NA 140 144  K 3.1* 3.2*  CL 107 113*  CO2 25 24  GLUCOSE 154*  109*  BUN 18 15  CREATININE 0.87 0.71  CALCIUM 9.5 8.6*   GFR: Estimated Creatinine Clearance: 50.8 mL/min (by C-G formula based on SCr of 0.8 mg/dL). Liver Function Tests:  Recent Labs Lab 04/10/16 1912  AST 21  ALT 14  ALKPHOS 105  BILITOT 0.9  PROT 7.6  ALBUMIN 3.8   No results for input(s): LIPASE, AMYLASE in the last 168 hours. No results for input(s): AMMONIA in the last 168 hours. Coagulation Profile:  Recent Labs Lab 04/11/16 0149  INR 1.03   Cardiac Enzymes: No results for input(s): CKTOTAL, CKMB, CKMBINDEX, TROPONINI in the last 168 hours. BNP (last 3 results) No results for input(s): PROBNP in the last 8760 hours. HbA1C: No results for input(s): HGBA1C in the last 72 hours. CBG: No results for input(s): GLUCAP in the last 168 hours. Lipid Profile: No results for input(s): CHOL, HDL, LDLCALC, TRIG, CHOLHDL, LDLDIRECT in the last 72 hours. Thyroid Function Tests: No results for input(s): TSH, T4TOTAL, FREET4, T3FREE, THYROIDAB in the last 72 hours. Anemia Panel: No results for input(s): VITAMINB12, FOLATE, FERRITIN, TIBC, IRON, RETICCTPCT in the last 72 hours. Sepsis Labs:  Recent Labs Lab 04/10/16 2011 04/10/16 2307 04/11/16 0149  PROCALCITON  --   --  <0.10  LATICACIDVEN 3.29* 2.02* 2.0*    Recent Results (from the past 240 hour(s))  Culture, blood (Routine x 2)     Status: None (Preliminary result)   Collection Time: 04/10/16  7:12 PM  Result Value Ref Range Status   Specimen Description BLOOD LEFT ARM [facility]   Final   Special Requests BOTTLES DRAWN AEROBIC AND ANAEROBIC 5CC  Final   Culture PENDING  Incomplete   Report Status PENDING  Incomplete  Culture, blood (Routine x 2)     Status: None (Preliminary result)   Collection Time: 04/10/16  7:17 PM  Result Value Ref Range Status   Specimen Description   Final    BLOOD LEFT HAND Performed at [facility]    Special Requests BOTTLES DRAWN AEROBIC AND  ANAEROBIC 5CC  Final   Culture PENDING  Incomplete   Report Status PENDING  Incomplete         Radiology Studies: Dg Chest 2 View  Result Date: 04/10/2016 CLINICAL DATA:  Less reactive the normal and drooling. EXAM: CHEST  2 VIEW COMPARISON:  07/01/2015 FINDINGS: Low volume film. Cardiopericardial silhouette is at upper limits of normal for size. Interstitial markings are diffusely coarsened with chronic features. Face obscures the left apex. Bones are diffusely demineralized. Telemetry leads overlie the chest. IMPRESSION: Low volume film with chronic interstitial coarsening. No edema or focal lung consolidation. Electronically Signed   By: Minerva AreolaEric  Molli Posey M.D.   On: 04/10/2016 19:51   Dg Abd Portable 1v  Result Date: 04/11/2016 CLINICAL DATA:  82 y/o  F; sepsis and mid abdominal pain. EXAM: PORTABLE ABDOMEN - 1 VIEW COMPARISON:  Pelvis radiograph dated 05/23/2012 and abdomen radiograph dated 10/14/2008. FINDINGS: The bowel gas pattern is normal. No radio-opaque calculi or other significant radiographic abnormality are seen. Abdominal aortic calcific atherosclerosis and iliac artery calcifications. Degenerative changes of the lower lumbar spine. IMPRESSION: Normal bowel gas pattern. Electronically Signed   By: Mitzi Hansen M.D.   On: 04/11/2016 02:37        Scheduled Meds: . cefTRIAXone (ROCEPHIN)  IV  2 g Intravenous QHS  . donepezil  5 mg Oral QHS  . polyethylene glycol  17 g Oral Q breakfast  . sodium chloride flush  3 mL Intravenous Q12H  . verapamil  80 mg Oral TID   Continuous Infusions: . sodium chloride 75 mL/hr at 04/11/16 0140     LOS: 1 day    Time spent: 35 minutes.     Alba Cory, MD [facility] Pager [phone]  If 7PM-7AM, please contact night-coverage [email or web address] Password [email or web address] 04/11/2016, 9:59 AM

## 2016-04-12 ENCOUNTER — Inpatient Hospital Stay (HOSPITAL_COMMUNITY): Payer: Medicare Other

## 2016-04-12 DIAGNOSIS — R509 Fever, unspecified: Secondary | ICD-10-CM

## 2016-04-12 LAB — BASIC METABOLIC PANEL
ANION GAP: 4 — AB (ref 5–15)
BUN: 8 mg/dL (ref 6–20)
CALCIUM: 8.5 mg/dL — AB (ref 8.9–10.3)
CO2: 24 mmol/L (ref 22–32)
CREATININE: 0.73 mg/dL (ref 0.44–1.00)
Chloride: 112 mmol/L — ABNORMAL HIGH (ref 101–111)
Glucose, Bld: 110 mg/dL — ABNORMAL HIGH (ref 65–99)
Potassium: 3.4 mmol/L — ABNORMAL LOW (ref 3.5–5.1)
SODIUM: 140 mmol/L (ref 135–145)

## 2016-04-12 LAB — URINE CULTURE

## 2016-04-12 LAB — POTASSIUM: POTASSIUM: 3.5 mmol/L (ref 3.5–5.1)

## 2016-04-12 LAB — MAGNESIUM: Magnesium: 1.7 mg/dL (ref 1.7–2.4)

## 2016-04-12 MED ORDER — POTASSIUM CHLORIDE CRYS ER 20 MEQ PO TBCR
40.0000 meq | EXTENDED_RELEASE_TABLET | Freq: Once | ORAL | Status: AC
  Administered 2016-04-12: 40 meq via ORAL
  Filled 2016-04-12 (×2): qty 2

## 2016-04-12 MED ORDER — LEVALBUTEROL HCL 0.63 MG/3ML IN NEBU: 0.6300 mg | INHALATION_SOLUTION | Freq: Four times a day (QID) | RESPIRATORY_TRACT | Status: DC | PRN

## 2016-04-12 MED ORDER — ALBUTEROL SULFATE (2.5 MG/3ML) 0.083% IN NEBU: 2.5000 mg | INHALATION_SOLUTION | Freq: Four times a day (QID) | RESPIRATORY_TRACT | Status: DC

## 2016-04-12 MED ORDER — ACETAMINOPHEN 500 MG PO TABS
500.0000 mg | ORAL_TABLET | Freq: Two times a day (BID) | ORAL | Status: DC
  Administered 2016-04-12 – 2016-04-13 (×3): 500 mg via ORAL
  Filled 2016-04-12 (×3): qty 1

## 2016-04-12 MED ORDER — MAGNESIUM SULFATE 2 GM/50ML IV SOLN
2.0000 g | Freq: Once | INTRAVENOUS | Status: AC
  Administered 2016-04-12: 2 g via INTRAVENOUS
  Filled 2016-04-12: qty 50

## 2016-04-12 MED ORDER — DEXTROSE 5 % IV SOLN
1.0000 g | INTRAVENOUS | Status: DC
  Administered 2016-04-12: 1 g via INTRAVENOUS
  Filled 2016-04-12 (×2): qty 10

## 2016-04-12 MED ORDER — GUAIFENESIN ER 600 MG PO TB12
600.0000 mg | ORAL_TABLET | Freq: Two times a day (BID) | ORAL | Status: DC
  Administered 2016-04-12 – 2016-04-13 (×3): 600 mg via ORAL
  Filled 2016-04-12 (×3): qty 1

## 2016-04-12 MED ORDER — POTASSIUM CHLORIDE CRYS ER 20 MEQ PO TBCR
40.0000 meq | EXTENDED_RELEASE_TABLET | Freq: Once | ORAL | Status: AC
  Administered 2016-04-12: 40 meq via ORAL
  Filled 2016-04-12: qty 2

## 2016-04-12 NOTE — Progress Notes (Signed)
[facility] [phone] and care manager report they will not need a FL2 for patient. Just signed prescriptions and discharge summary.

## 2016-04-12 NOTE — Progress Notes (Signed)
Rt D/C nebs. Pt takes no respiratory medications at home. Pt is on room air sats 98%. Pt appears to be in A-fib HR 124 then down to 73 on assessment. Rt order xopenex Q6 PRN. RN at bedside. Pt in no distress at this time. Pt does have congested cough with slight rhonchi BS.

## 2016-04-12 NOTE — Procedures (Signed)
Objective Swallowing Evaluation: Type of Study: MBS-Modified Barium Swallow Study  Patient Details  Name: Brittany Powers MRN: [id number] Date of Birth: 08/16/1931  Today's Date: 04/12/2016 Time: SLP Start Time (ACUTE ONLY): 1410-SLP Stop Time (ACUTE ONLY): 1435 SLP Time Calculation (min) (ACUTE ONLY): 25 min  Past Medical History:  Past Medical History:  Diagnosis Date  . CVA (cerebral infarction)   . Dementia   . High cholesterol   . Hypertension   . Incontinent of urine    Past Surgical History:  Past Surgical History:  Procedure Laterality Date  . NO PAST SURGERIES     HPI: 82 yo [facility], decreased po intake.  PMH + for dementia, prior smoker, CVA/TIA, ? UTI/sepsis.  Pt for swallow evaluation, family present reports pt resides at a memory care unit and will frequently cough with intake over the last several months.  Pt does not take reflux medications - she does not verbalize and therefore can not provide history.    Subjective: pt awake in chair, sister Ivor MessierCora came to xray with pt  Assessment / Plan / Recommendation  CHL IP CLINICAL IMPRESSIONS 04/12/2016  Therapy Diagnosis Mild oral phase dysphagia;Mild pharyngeal phase dysphagia;Suspected primary esophageal dysphagia  Clinical Impression Mild oropharyngeal and suspected primary esophageal dysphagia.  No aspiration/penetration of any consistency tested.  Pt with oral holding - likely due to cognitive deficit - requiring thin barium to orally transit pudding.  Self feeding helps pt to funcitonally swallow as it aid automaticity.  Pharyngeal swallow is strong - albeit delayed to vallecular space (longer than 3 seconds) with solids.  Barium tablet with thin readily cleared oropharyngeal region without deficits.  Upon esophageal sweep, pt appeared with slow clearance.  Retrograde propulsion of barium noted to proximal esophagus without pt awareness.  Cough noted during MBS without barium visualized in larynx causing SLP  to suspect primary esophageal deficits. Educated pt/sister Cora to findings/recommendations to mitigate risk.   Pt may benefit from dedicated esophageal evaluation in the future if deficits do not resolve/improve.    Impact on safety and function Mild aspiration risk      CHL IP TREATMENT RECOMMENDATION 04/12/2016  Treatment Recommendations Therapy as outlined in treatment plan below     Prognosis 04/12/2016  Prognosis for Safe Diet Advancement Fair  Barriers to Reach Goals Cognitive deficits;Severity of deficits  Barriers/Prognosis Comment --    CHL IP DIET RECOMMENDATION 04/12/2016  SLP Diet Recommendations Dysphagia 3 (Mech soft) solids;Thin liquid  Liquid Administration via Cup;Straw  Medication Administration Whole meds with puree  Compensations Slow rate;Small sips/bites  Postural Changes Remain semi-upright after after feeds/meals (Comment);Seated upright at 90 degrees      CHL IP OTHER RECOMMENDATIONS 04/12/2016  Recommended Consults Consider esophageal assessment  Oral Care Recommendations Oral care BID  Other Recommendations --      No flowsheet data found.    CHL IP FREQUENCY AND DURATION 04/12/2016  Speech Therapy Frequency (ACUTE ONLY) min 1 x/week  Treatment Duration 1 week           CHL IP ORAL PHASE 04/12/2016  Oral Phase Impaired  Oral - Pudding Teaspoon --  Oral - Pudding Cup --  Oral - Honey Teaspoon --  Oral - Honey Cup --  Oral - Nectar Teaspoon --  Oral - Nectar Cup WFL;Holding of bolus  Oral - Nectar Straw --  Oral - Thin Teaspoon --  Oral - Thin Cup Holding of bolus  Oral - Thin Straw Holding of  bolus  Oral - Puree Holding of bolus;Other (Comment)  Oral - Mech Soft Delayed oral transit  Oral - Regular --  Oral - Multi-Consistency --  Oral - Pill WFL  Oral Phase - Comment --    CHL IP PHARYNGEAL PHASE 04/12/2016  Pharyngeal Phase Impaired  Pharyngeal- Pudding Teaspoon --  Pharyngeal --  Pharyngeal- Pudding Cup --  Pharyngeal --  Pharyngeal-  Honey Teaspoon --  Pharyngeal --  Pharyngeal- Honey Cup --  Pharyngeal --  Pharyngeal- Nectar Teaspoon --  Pharyngeal --  Pharyngeal- Nectar Cup WFL  Pharyngeal --  Pharyngeal- Nectar Straw --  Pharyngeal --  Pharyngeal- Thin Teaspoon WFL  Pharyngeal --  Pharyngeal- Thin Cup WFL  Pharyngeal --  Pharyngeal- Thin Straw WFL  Pharyngeal --  Pharyngeal- Puree Delayed swallow initiation-vallecula  Pharyngeal --  Pharyngeal- Mechanical Soft Delayed swallow initiation-vallecula  Pharyngeal --  Pharyngeal- Regular --  Pharyngeal --  Pharyngeal- Multi-consistency --  Pharyngeal --  Pharyngeal- Pill WFL  Pharyngeal --  Pharyngeal Comment --     CHL IP CERVICAL ESOPHAGEAL PHASE 04/12/2016  Cervical Esophageal Phase Impaired  Pudding Teaspoon --  Pudding Cup --  Honey Teaspoon --  Honey Cup --  Nectar Teaspoon --  Nectar Cup --  Nectar Straw --  Thin Teaspoon --  Thin Cup --  Thin Straw --  Puree --  Mechanical Soft --  Regular --  Multi-consistency --  Pill --  Cervical Esophageal Comment appearance of delayed esophageal clearance with retrograde propulsion without pt awareness; water provided with appearance of fluid line; could not view to GE due to xray equipment; suspect primary esophageal deficits     No flowsheet data found.  Chales AbrahamsKimball, Moncerrat Burnstein Ann 04/12/2016, 3:34 PM

## 2016-04-12 NOTE — Progress Notes (Signed)
PROGRESS NOTE    [name]  ZOX:[id number] DOB: June 16, 1931 DOA: 04/10/2016 PCP: Fredirick Maudlin, MD    Brief Narrative :Brittany Powers is a 82 y.o. woman with a history of CVA/TIA and aphasia (nonverbal at baseline), dementia, HTN, and dyslipidemia who is accompanied by three siblings who was reportedly in her baseline state of health until the day of presentation.  She typically smiles at family and eats well.  Family was notified today of a change in her baseline.  Family observed her to be more withdrawn with her eyes closed.  She has also had decreased PO intake.  The patient was transferred to the ED at [facility] for further evaluation.  ED Course: Fever to 102 was not identified until her arrival in the ED.  WBC count also elevated to 12.  Lactic acid level elevated to 3.29.  Code Sepsis initiated.  The patient received volume resuscitation 30cc/kg in the ED per protocol.  Broad spectrum antibiotics including vancomycin and zosyn were given in the ED.  U/A subsequently suggests UTI as source of infection.  Blood and urine cultures are pending.  The patient is not hypoxic, hypotensive, or in renal failure.  Hospitalist asked to admit for ongoing management.   Assessment & Plan:   Principal Problem:   Sepsis (HCC) Active Problems:   HTN (hypertension)   Aphasia as late effect of cerebrovascular accident   Dementia   UTI (lower urinary tract infection)   Leukocytosis   Fever   Sepsis secondary to UTI --Continue with IV ceftriaxone  --Blood culture no growth to date. and urine cultures multiple species.  --Repeat lactic acid level trending down peak to 3.29 and procalcitonin negative --Acetaminophen as needed for fever -will ask speech for swallow evaluation to assess for aspiration risk.   HTN --Hold HCTZ for now, continue verapamil  Hypokalemia --Replacement ordered  RUE tremor --Monitor. Appears to have  resolved.   CVA history with resultant aphasia;  Had  also Right subdural Hematoma 2016. PCP to decide when to resume aspirin for stroke prevention.   Bronchitis; cough.  Stop IV fluids.  Check Chest x ray.  Nebulizer Tx   DVT prophylaxis: lovenox Code Status: DNR Family Communication: sister at bedside.  Disposition Plan: Memory Care Unit at [facility]   Consultants:   none   Procedures:   none   Antimicrobials:   Ceftriaxone   Subjective: Had BM less watery  Sister at bedside.  Patient with cough and chest congestion per sister   Objective: Vitals:   04/11/16 0513 04/11/16 1451 04/11/16 2142 04/12/16 0444  BP: 139/84 124/72 127/77 140/70  Pulse: 78 77 78 70  Resp: 18 18 20  (!) 22  Temp: 97.9 F (36.6 C) 99.3 F (37.4 C) (!) 100.4 F (38 C) 98.9 F (37.2 C)  TempSrc: Oral Oral Oral Oral  SpO2: 97% 95% 95% 98%  Weight:      Height:        Intake/Output Summary (Last 24 hours) at 04/12/16 1034 Last data filed at 04/12/16 0754  Gross per 24 hour  Intake             1480 ml  Output                0 ml  Net             1480 ml   Filed Weights   04/10/16 2011  Weight: 69.9 kg (154 lb)    Examination:  General exam: Appears  calm and comfortable non verbal.  Respiratory system: sporadic wheezing, ronchus Respiratory effort normal. Cardiovascular system: S1 & S2 heard, RRR. No JVD, murmurs, rubs, gallops or clicks. No pedal edema. Gastrointestinal system: Abdomen is nondistended, soft and nontender. No organomegaly or masses felt. Normal bowel sounds heard. Central nervous system: Alert and oriented. No focal neurological deficits. Extremities: Symmetric 5 x 5 power. Skin: No rashes, lesions or ulcers Psychiatry: Judgement and insight appear normal. Mood & affect appropriate.     Data Reviewed: I have personally reviewed following labs and imaging studies  CBC:  Recent Labs Lab 04/10/16 1912 04/11/16 0149  WBC 12.2* 9.1  NEUTROABS 10.5*  --   HGB 14.6 14.0  HCT 43.4 41.5  MCV 93.9 95.0    PLT 182 151   Basic Metabolic Panel:  Recent Labs Lab 04/10/16 1912 04/11/16 0149  NA 140 144  K 3.1* 3.2*  CL 107 113*  CO2 25 24  GLUCOSE 154* 109*  BUN 18 15  CREATININE 0.87 0.71  CALCIUM 9.5 8.6*   GFR: Estimated Creatinine Clearance: 50.8 mL/min (by C-G formula based on SCr of 0.8 mg/dL). Liver Function Tests:  Recent Labs Lab 04/10/16 1912  AST 21  ALT 14  ALKPHOS 105  BILITOT 0.9  PROT 7.6  ALBUMIN 3.8   No results for input(s): LIPASE, AMYLASE in the last 168 hours. No results for input(s): AMMONIA in the last 168 hours. Coagulation Profile:  Recent Labs Lab 04/11/16 0149  INR 1.03   Cardiac Enzymes: No results for input(s): CKTOTAL, CKMB, CKMBINDEX, TROPONINI in the last 168 hours. BNP (last 3 results) No results for input(s): PROBNP in the last 8760 hours. HbA1C: No results for input(s): HGBA1C in the last 72 hours. CBG: No results for input(s): GLUCAP in the last 168 hours. Lipid Profile: No results for input(s): CHOL, HDL, LDLCALC, TRIG, CHOLHDL, LDLDIRECT in the last 72 hours. Thyroid Function Tests: No results for input(s): TSH, T4TOTAL, FREET4, T3FREE, THYROIDAB in the last 72 hours. Anemia Panel: No results for input(s): VITAMINB12, FOLATE, FERRITIN, TIBC, IRON, RETICCTPCT in the last 72 hours. Sepsis Labs:  Recent Labs Lab 04/10/16 2011 04/10/16 2307 04/11/16 0149  PROCALCITON  --   --  <0.10  LATICACIDVEN 3.29* 2.02* 2.0*    Recent Results (from the past 240 hour(s))  Culture, blood (Routine x 2)     Status: None (Preliminary result)   Collection Time: 04/10/16  7:12 PM  Result Value Ref Range Status   Specimen Description BLOOD LEFT ARM  Final   Special Requests BOTTLES DRAWN AEROBIC AND ANAEROBIC 5CC  Final   Culture   Final    NO GROWTH < 24 HOURS Performed at [facility]    Report Status PENDING  Incomplete  Culture, blood (Routine x 2)     Status: None (Preliminary result)   Collection Time: 04/10/16  7:17  PM  Result Value Ref Range Status   Specimen Description BLOOD LEFT HAND  Final   Special Requests BOTTLES DRAWN AEROBIC AND ANAEROBIC 5CC  Final   Culture   Final    NO GROWTH < 24 HOURS Performed at [facility]    Report Status PENDING  Incomplete  MRSA PCR Screening     Status: None   Collection Time: 04/11/16  9:49 PM  Result Value Ref Range Status   MRSA by PCR NEGATIVE NEGATIVE Final    Comment:        The GeneXpert MRSA Assay (FDA approved for NASAL  specimens only), is one component of a comprehensive MRSA colonization surveillance program. It is not intended to diagnose MRSA infection nor to guide or monitor treatment for MRSA infections.          Radiology Studies: Dg Chest 2 View  Result Date: 04/10/2016 CLINICAL DATA:  Less reactive the normal and drooling. EXAM: CHEST  2 VIEW COMPARISON:  07/01/2015 FINDINGS: Low volume film. Cardiopericardial silhouette is at upper limits of normal for size. Interstitial markings are diffusely coarsened with chronic features. Face obscures the left apex. Bones are diffusely demineralized. Telemetry leads overlie the chest. IMPRESSION: Low volume film with chronic interstitial coarsening. No edema or focal lung consolidation. Electronically Signed   By: Kennith CenterEric  Mansell M.D.   On: 04/10/2016 19:51   Dg Abd Portable 1v  Result Date: 04/11/2016 CLINICAL DATA:  82 y/o  F; sepsis and mid abdominal pain. EXAM: PORTABLE ABDOMEN - 1 VIEW COMPARISON:  Pelvis radiograph dated 05/23/2012 and abdomen radiograph dated 10/14/2008. FINDINGS: The bowel gas pattern is normal. No radio-opaque calculi or other significant radiographic abnormality are seen. Abdominal aortic calcific atherosclerosis and iliac artery calcifications. Degenerative changes of the lower lumbar spine. IMPRESSION: Normal bowel gas pattern. Electronically Signed   By: Mitzi HansenLance  Furusawa-Stratton M.D.   On: 04/11/2016 02:37        Scheduled Meds: . acetaminophen  500 mg  Oral BID  . albuterol  2.5 mg Nebulization QID  . cefTRIAXone (ROCEPHIN)  IV  1 g Intravenous Q24H  . donepezil  5 mg Oral QHS  . enoxaparin (LOVENOX) injection  40 mg Subcutaneous Q24H  . guaiFENesin  600 mg Oral BID  . sodium chloride flush  3 mL Intravenous Q12H  . verapamil  80 mg Oral TID   Continuous Infusions:     LOS: 2 days    Time spent: 35 minutes.     Alba Coryegalado, Calynn Ferrero A, MD [facility] Pager [phone]  If 7PM-7AM, please contact night-coverage [email or web address] Password [email or web address] 04/12/2016, 10:34 AM

## 2016-04-12 NOTE — Progress Notes (Signed)
Pharmacy Antibiotic Note  Hanley HaysBettye G Woolever is a 82 y.o. female admitted on 04/10/2016 with sepsis and UTI.  Pharmacy has been consulted for Ceftriaxone dosing.  Plan: Due to indication of UTI, reduce dose to Ceftriaxone 1g q24h. Dosage remains stable and need for further dosage adjustment appears unlikely at present.    Will sign off at this time.  Please reconsult if a change in clinical status warrants re-evaluation of dosage.  Thank you for allowing pharmacy to be a part of this patient's care.  Haynes Hoehnolleen Anberlin Diez, PharmD, BCPS 04/12/2016, 8:29 AM  Pager: [phone]    Height: 5\' 3"  (160 cm) Weight: 154 lb (69.9 kg) IBW/kg (Calculated) : 52.4  Temp (24hrs), Avg:99.5 F (37.5 C), Min:98.9 F (37.2 C), Max:100.4 F (38 C)   Recent Labs Lab 04/10/16 1912 04/10/16 2011 04/10/16 2307 04/11/16 0149  WBC 12.2*  --   --  9.1  CREATININE 0.87  --   --  0.71  LATICACIDVEN  --  3.29* 2.02* 2.0*    Estimated Creatinine Clearance: 50.8 mL/min (by C-G formula based on SCr of 0.8 mg/dL).    Allergies  Allergen Reactions  . Bee Venom Itching and Swelling    Unknown swelling location     Antimicrobials this admission: 8/16 Zosyn >> 8/17 8/16 Vancomycin >> 8/17 8/17 Ceftriaxone >>   Dose adjustments this admission: 8/18 CTX 2g q24h --> 1g q24h for UTI indication  Microbiology results: 8/16 BCx: NGTD 8/16 UA: many bacteria, + nitrite, small leuk, +WBC 8/16 UCx: collected 8/17 MRSA PCR: negative Thank you for allowing pharmacy to be a part of this patient's care.

## 2016-04-12 NOTE — Evaluation (Signed)
Clinical/Bedside Swallow Evaluation Patient Details  Name: Hanley HaysBettye G Cohron MRN: [id number] Date of Birth: 02/16/1931  Today's Date: 04/12/2016 Time: SLP Start Time (ACUTE ONLY): 1225 SLP Stop Time (ACUTE ONLY): 1240 SLP Time Calculation (min) (ACUTE ONLY): 15 min  Past Medical History:  Past Medical History:  Diagnosis Date  . CVA (cerebral infarction)   . Dementia   . High cholesterol   . Hypertension   . Incontinent of urine    Past Surgical History:  Past Surgical History:  Procedure Laterality Date  . NO PAST SURGERIES     HPI:  82 yo female [facility] with AMS, decreased po intake.  PMH + for dementia, prior smoker, CVA/TIA, ? UTI/sepsis.  Pt for swallow evaluation, family present reports pt resides at a memory care unit and will frequently cough with intake over the last several months.     Assessment / Plan / Recommendation Clinical Impression  Pt presents with symptoms concerning for pharyngeal dysphagia and potentially aspiration c/b coughing after swallowing.  She appears with delayed swallow response also - ? pharyngeal delay?   Family reports pt coughing with intake for several months.  Today cough s/p swallow of thin was congested = without pt awareness/apparent distress.  She is febrile but CXR was clear.  Mrs Jacqulyn DuckingFerrell is aphasic and SLP can not rely on her phonation for vocal quality assessment/vagal nerve eval.  Given progressive coughing with intake x several months and pt h/o CVA, will proceed with MBS.  Family/pt/RN educated.     Aspiration Risk  Mild aspiration risk    Diet Recommendation          Other  Recommendations     Follow up Recommendations       Frequency and Duration            Prognosis        Swallow Study   General Date of Onset: 04/12/16 HPI: [age] yo [facility], decreased po intake.  PMH + for dementia, prior smoker, CVA/TIA, ? UTI/sepsis.  Pt for swallow evaluation, family present reports pt resides at a memory care  unit and will frequently cough with intake over the last several months.   Type of Study: Bedside Swallow Evaluation Diet Prior to this Study: Regular;Thin liquids Temperature Spikes Noted: Yes (fever) Respiratory Status: Room air History of Recent Intubation: No Behavior/Cognition: Alert;Cooperative Oral Cavity Assessment: Within Functional Limits Oral Care Completed by SLP: No Oral Cavity - Dentition: Edentulous (pt does not wear dentures) Vision: Functional for self-feeding Self-Feeding Abilities: Able to feed self Patient Positioning: Upright in bed Baseline Vocal Quality: Other (comment) (pt does not speak) Volitional Cough: Cognitively unable to elicit Volitional Swallow: Unable to elicit    Oral/Motor/Sensory Function Overall Oral Motor/Sensory Function:  (no focal cn deficits)   Ice Chips Ice chips: Not tested   Thin Liquid Thin Liquid: Impaired Presentation: Cup;Straw Pharyngeal  Phase Impairments: Suspected delayed Swallow;Cough - Delayed    Nectar Thick Nectar Thick Liquid: Not tested   Honey Thick Honey Thick Liquid: Not tested   Puree Puree: Impaired Presentation: Self Fed;Spoon Oral Phase Impairments: Reduced lingual movement/coordination Oral Phase Functional Implications: Prolonged oral transit Pharyngeal Phase Impairments: Suspected delayed Swallow   Solid   GO   Solid: Impaired Presentation: Self Fed Oral Phase Impairments: Reduced lingual movement/coordination Oral Phase Functional Implications: Prolonged oral transit Pharyngeal Phase Impairments: Suspected delayed Fredric MareSwallow        Ellakate Gonsalves, MS [facility] SLP [phone]

## 2016-04-13 LAB — BASIC METABOLIC PANEL
ANION GAP: 6 (ref 5–15)
BUN: 8 mg/dL (ref 6–20)
CO2: 24 mmol/L (ref 22–32)
Calcium: 8.6 mg/dL — ABNORMAL LOW (ref 8.9–10.3)
Chloride: 110 mmol/L (ref 101–111)
Creatinine, Ser: 0.69 mg/dL (ref 0.44–1.00)
Glucose, Bld: 107 mg/dL — ABNORMAL HIGH (ref 65–99)
POTASSIUM: 3.9 mmol/L (ref 3.5–5.1)
SODIUM: 140 mmol/L (ref 135–145)

## 2016-04-13 MED ORDER — CEPHALEXIN 250 MG/5ML PO SUSR
500.0000 mg | Freq: Once | ORAL | Status: AC
  Administered 2016-04-13: 500 mg via ORAL
  Filled 2016-04-13: qty 10

## 2016-04-13 MED ORDER — POTASSIUM CHLORIDE 20 MEQ/15ML (10%) PO SOLN: 20.0000 meq | Freq: Every day | ORAL | 0 refills | Status: DC

## 2016-04-13 MED ORDER — CEPHALEXIN 500 MG PO CAPS: 500.0000 mg | ORAL_CAPSULE | Freq: Four times a day (QID) | ORAL | 0 refills | Status: DC

## 2016-04-13 MED ORDER — POTASSIUM CHLORIDE 20 MEQ/15ML (10%) PO SOLN
20.0000 meq | Freq: Every day | ORAL | Status: DC
  Administered 2016-04-13: 20 meq via ORAL
  Filled 2016-04-13: qty 15

## 2016-04-13 MED ORDER — LEVALBUTEROL HCL 0.63 MG/3ML IN NEBU: 0.6300 mg | INHALATION_SOLUTION | Freq: Four times a day (QID) | RESPIRATORY_TRACT | 12 refills | Status: DC | PRN

## 2016-04-13 NOTE — [place] FL2 (Deleted)
[place] MEDICAID FL2 LEVEL OF CARE SCREENING TOOL     IDENTIFICATION  Patient Name: Brittany HaysBettye G Milkovich Birthdate: 10/01/1930 Sex: female Admission Date (Current Location): 04/10/2016  [facility] [place]   Facility and Address:  [facility],  [address], [place] [id number]      Provider Number: [id number]  Attending Physician Name and Address:  Alba CoryBelkys A Regalado, MD  Relative Name and Phone Number:       Current Level of Care: Hospital Recommended Level of Care: Assisted Living Facility Prior Approval Number:    Date Approved/Denied:   PASRR Number:    Discharge Plan: Other (Comment) [facility]    Current Diagnoses: Patient Active Problem List   Diagnosis Date Noted  . Sepsis (HCC) 04/10/2016  . UTI (lower urinary tract infection) 04/10/2016  . Leukocytosis 04/10/2016  . Fever 04/10/2016  . Epidural hematoma (HCC) 02/08/2015  . Fall at nursing home 02/08/2015  . HTN (hypertension) 02/08/2015  . Aphasia as late effect of cerebrovascular accident 02/08/2015  . Dementia 02/08/2015    Orientation RESPIRATION BLADDER Height & Weight     Self  Normal Incontinent Weight: 69.9 kg (154 lb) Height:  5\' 3"  (160 cm)  BEHAVIORAL SYMPTOMS/MOOD NEUROLOGICAL BOWEL NUTRITION STATUS   (NONE)  (NONE) Incontinent Diet (Dysphagia 3, think fluids, ground meats.)  AMBULATORY STATUS COMMUNICATION OF NEEDS Skin   Limited Assist Verbally Normal                       Personal Care Assistance Level of Assistance  Bathing, Feeding, Dressing Bathing Assistance: Limited assistance Feeding assistance: Independent Dressing Assistance: Limited assistance     Functional Limitations Info  Sight, Hearing, Speech Sight Info: Adequate Hearing Info: Adequate Speech Info: Adequate    SPECIAL CARE FACTORS FREQUENCY                       Contractures Contractures Info: Not present    Additional Factors Info  Code Status,  Allergies Code Status Info: DNR Allergies Info: Bee Venom           Current Medications (04/13/2016):  This is the current hospital active medication list Current Facility-Administered Medications  Medication Dose Route Frequency Provider Last Rate Last Dose  . acetaminophen (TYLENOL) tablet 500 mg  500 mg Oral Q4H PRN Michael LitterNikki Carter, MD   500 mg at 04/11/16 2153  . acetaminophen (TYLENOL) tablet 500 mg  500 mg Oral BID Belkys A Regalado, MD   500 mg at 04/13/16 1122  . cefTRIAXone (ROCEPHIN) 1 g in dextrose 5 % 50 mL IVPB  1 g Intravenous Q24H Belkys A Regalado, MD   1 g at 04/12/16 2252  . donepezil (ARICEPT) tablet 5 mg  5 mg Oral QHS Michael LitterNikki Carter, MD   5 mg at 04/12/16 2252  . enoxaparin (LOVENOX) injection 40 mg  40 mg Subcutaneous Q24H Belkys A Regalado, MD   40 mg at 04/12/16 1751  . guaiFENesin (MUCINEX) 12 hr tablet 600 mg  600 mg Oral BID Belkys A Regalado, MD   600 mg at 04/13/16 1122  . levalbuterol (XOPENEX) nebulizer solution 0.63 mg  0.63 mg Nebulization Q6H PRN Belkys A Regalado, MD      . ondansetron (ZOFRAN) tablet 4 mg  4 mg Oral Q6H PRN Michael LitterNikki Carter, MD       Or  . ondansetron [facility](ZOFRAN) injection 4 mg  4 mg Intravenous Q6H PRN Lowella BandyNikki  Montez Moritaarter, MD      . potassium chloride 20 MEQ/15ML (10%) solution 20 mEq  20 mEq Oral Daily Belkys A Regalado, MD   20 mEq at 04/13/16 1123  . sodium chloride flush (NS) 0.9 % injection 3 mL  3 mL Intravenous Q12H Michael LitterNikki Carter, MD   3 mL at 04/12/16 2253  . traMADol (ULTRAM) tablet 50 mg  50 mg Oral Q6H PRN Michael LitterNikki Carter, MD      . verapamil (CALAN) tablet 80 mg  80 mg Oral TID Michael LitterNikki Carter, MD   80 mg at 04/13/16 1122     Discharge Medications: Please see discharge summary for a list of discharge medications.  Relevant Imaging Results:  Relevant Lab Results:   Additional Information    Venita Lickampbell, Hairo Garraway B, LCSW

## 2016-04-13 NOTE — Discharge Summary (Addendum)
Physician Discharge Summary  Brittany Powers CBS:[id number] DOB: [birth year] DOA: 04/10/2016  PCP: Alonza Bogus, MD  Admit date: 04/10/2016 Discharge date: 04/13/2016  Admitted From: SNF Disposition: SNF  Recommendations for Outpatient Follow-up:  1. Follow up with PCP in 1-2 weeks 2. Please obtain BMP/CBC 48 hours, replete potassium as needed.  3. Recommend Palliative care consult.     Discharge Condition: Stable.  CODE STATUS; DNR Diet recommendation: Dysphagia 3, thin   Brief/Interim Summary: Brief Narrative :Brittany Powers a 82 y.o.woman with a history of CVA/TIA and aphasia (nonverbal at baseline), dementia, HTN, and dyslipidemia who is accompanied by three siblings who was reportedly in her baseline state of health until the day of presentation. She typically smiles at family and eats well. Family was notified today of a change in her baseline. Family observed her to be more withdrawn with her eyes closed. She has also had decreased PO intake. The patient was transferred to the ED at [facility] for further evaluation.  ED Course:Fever to 102 was not identified until her arrival in the ED. WBC count also elevated to 12. Lactic acid level elevated to 3.29. Code Sepsis initiated. The patient received volume resuscitation 30cc/kg in the ED per protocol. Broad spectrum antibiotics including vancomycin and zosyn were given in the ED. U/A subsequently suggests UTI as source of infection. Blood and urine cultures are pending. The patient is not hypoxic, hypotensive, or in renal failure. Hospitalist asked to admit for ongoing management.   Assessment & Plan: Sepsis secondary to UTI --Received  IV ceftriaxone for 3 days, discharge on Keflex.  --Blood culture no growth to date. and urine cultures multiple species.  --Repeat lactic acid level trending down peak to 3.29 and procalcitonin negative --Acetaminophen as needed for fever --Mild risk for aspiration. Started  Dysphagia diet 3, thin   HTN --continue verapamil --Resume HCTZ.  Needs B-met.   Hypokalemia --resolved.   RUE tremor --Monitor. Appears to have  resolved.   CVA history with resultant aphasia;  Had also Right subdural Hematoma 2016. PCP to decide when to resume aspirin for stroke prevention.   Bronchitis; cough.  Stop IV fluids.  Chest x ray bronchitis.  Nebulizer Tx    Discharge Diagnoses:    Sepsis ([place])   UTI (lower urinary tract infection)   HTN (hypertension)   Aphasia as late effect of cerebrovascular accident   Dementia   Leukocytosis   Fever    Discharge Instructions  Discharge Instructions    Diet - low sodium heart healthy    Complete by:  As directed   Increase activity slowly    Complete by:  As directed       Medication List    STOP taking these medications   loperamide 2 MG capsule Commonly known as:  IMODIUM   polyethylene glycol packet Commonly known as:  MIRALAX / GLYCOLAX     TAKE these medications   acetaminophen 500 MG tablet Commonly known as:  TYLENOL Take 500 mg by mouth every 4 (four) hours as needed for mild pain, moderate pain, fever or headache.   BAZA PROTECT EX Apply 1 application topically as needed (with every incontinent episode). Apply to groin/buttocks   calcium carbonate 1250 (500 Ca) MG tablet Commonly known as:  OS-CAL - dosed in mg of elemental calcium Take 1 tablet (500 mg of elemental calcium total) by mouth daily with breakfast.   cephALEXin 500 MG capsule Commonly known as:  KEFLEX Take 1 capsule (500 mg total) by mouth  4 (four) times daily.   donepezil 5 MG tablet Commonly known as:  ARICEPT Take 5 mg by mouth at bedtime.   fish oil-omega-3 fatty acids 1000 MG capsule Take 1 g by mouth 2 (two) times daily.   guaifenesin 100 MG/5ML syrup Commonly known as:  ROBITUSSIN Take 200 mg by mouth every 6 (six) hours as needed for cough.   hydrochlorothiazide 12.5 MG tablet Commonly known as:   HYDRODIURIL Take 12.5 mg by mouth daily with breakfast.   levalbuterol 0.63 MG/3ML nebulizer solution Commonly known as:  XOPENEX Take 3 mLs (0.63 mg total) by nebulization every 6 (six) hours as needed for wheezing or shortness of breath.   magnesium hydroxide 400 MG/5ML suspension Commonly known as:  MILK OF MAGNESIA Take 30 mLs by mouth at bedtime as needed for mild constipation.   MINTOX 200-200-20 MG/5ML suspension Generic drug:  alum & mag hydroxide-simeth Take 30 mLs by mouth as needed for indigestion or heartburn.   neomycin-bacitracin-polymyxin Oint Commonly known as:  NEOSPORIN Apply 1 application topically daily as needed for wound care.   traMADol 50 MG tablet Commonly known as:  ULTRAM Take 50 mg by mouth every 6 (six) hours as needed for moderate pain.   verapamil 80 MG tablet Commonly known as:  CALAN Take 80 mg by mouth 3 (three) times daily.   Vitamin D (Ergocalciferol) 50000 units Caps capsule Commonly known as:  DRISDOL Take 50,000 Units by mouth every 30 (thirty) days.       Allergies  Allergen Reactions  . Bee Venom Itching and Swelling    Unknown swelling location     Consultations: none  Procedures/Studies: Dg Chest 2 View  Result Date: 04/12/2016 CLINICAL DATA:  Cough EXAM: CHEST  2 VIEW COMPARISON:  04/10/2016 FINDINGS: Heart is borderline in size. Mild hyperinflation of the lungs. No confluent opacities or effusions. Calcifications throughout the aorta. No acute bony abnormality. Bilateral breast implants noted. IMPRESSION: Hyperinflation/ COPD.  No acute findings. Electronically Signed   By: Rolm Baptise M.D.   On: 04/12/2016 11:57   Dg Chest 2 View  Result Date: 04/10/2016 CLINICAL DATA:  Less reactive the normal and drooling. EXAM: CHEST  2 VIEW COMPARISON:  07/01/2015 FINDINGS: Low volume film. Cardiopericardial silhouette is at upper limits of normal for size. Interstitial markings are diffusely coarsened with chronic features. Face  obscures the left apex. Bones are diffusely demineralized. Telemetry leads overlie the chest. IMPRESSION: Low volume film with chronic interstitial coarsening. No edema or focal lung consolidation. Electronically Signed   By: Misty Stanley M.D.   On: 04/10/2016 19:51   Dg Abd Portable 1v  Result Date: 04/11/2016 CLINICAL DATA:  82 y/o  F; sepsis and mid abdominal pain. EXAM: PORTABLE ABDOMEN - 1 VIEW COMPARISON:  Pelvis radiograph dated 05/23/2012 and abdomen radiograph dated 10/14/2008. FINDINGS: The bowel gas pattern is normal. No radio-opaque calculi or other significant radiographic abnormality are seen. Abdominal aortic calcific atherosclerosis and iliac artery calcifications. Degenerative changes of the lower lumbar spine. IMPRESSION: Normal bowel gas pattern. Electronically Signed   By: Kristine Garbe M.D.   On: 04/11/2016 02:37   Dg Swallowing Func-speech Pathology  Result Date: 04/12/2016 Objective Swallowing Evaluation: Type of Study: MBS-Modified Barium Swallow Study Patient Details Name: Brittany Powers MRN: [id number] Date of Birth: 12/05/1930 Today's Date: 04/12/2016 Time: SLP Start Time (ACUTE ONLY): 1410-SLP Stop Time (ACUTE ONLY): 1435 SLP Time Calculation (min) (ACUTE ONLY): 25 min Past Medical History: Past Medical History: Diagnosis Date . CVA (  cerebral infarction)  . Dementia  . High cholesterol  . Hypertension  . Incontinent of urine  Past Surgical History: Past Surgical History: Procedure Laterality Date . NO PAST SURGERIES   HPI: 82 yo female adm to [facility] with AMS, decreased po intake.  PMH + for dementia, prior smoker, CVA/TIA, ? UTI/sepsis.  Pt for swallow evaluation, family present reports pt resides at a memory care unit and will frequently cough with intake over the last several months.  Pt does not take reflux medications - she does not verbalize and therefore can not provide history.   Subjective: pt awake in chair, sister Mel Almond came to xray with pt Assessment / Plan /  Recommendation CHL IP CLINICAL IMPRESSIONS 04/12/2016 Therapy Diagnosis Mild oral phase dysphagia;Mild pharyngeal phase dysphagia;Suspected primary esophageal dysphagia Clinical Impression Mild oropharyngeal and suspected primary esophageal dysphagia.  No aspiration/penetration of any consistency tested.  Pt with oral holding - likely due to cognitive deficit - requiring thin barium to orally transit pudding.  Self feeding helps pt to funcitonally swallow as it aid automaticity.  Pharyngeal swallow is strong - albeit delayed to vallecular space (longer than 3 seconds) with solids.  Barium tablet with thin readily cleared oropharyngeal region without deficits.  Upon esophageal sweep, pt appeared with slow clearance.  Retrograde propulsion of barium noted to proximal esophagus without pt awareness.  Cough noted during MBS without barium visualized in larynx causing SLP to suspect primary esophageal deficits. Educated pt/sister Cora to findings/recommendations to mitigate risk.   Pt may benefit from dedicated esophageal evaluation in the future if deficits do not resolve/improve.   Impact on safety and function Mild aspiration risk   CHL IP TREATMENT RECOMMENDATION 04/12/2016 Treatment Recommendations Therapy as outlined in treatment plan below   Prognosis 04/12/2016 Prognosis for Safe Diet Advancement Fair Barriers to Reach Goals Cognitive deficits;Severity of deficits Barriers/Prognosis Comment -- CHL IP DIET RECOMMENDATION 04/12/2016 SLP Diet Recommendations Dysphagia 3 (Mech soft) solids;Thin liquid Liquid Administration via Cup;Straw Medication Administration Whole meds with puree Compensations Slow rate;Small sips/bites Postural Changes Remain semi-upright after after feeds/meals (Comment);Seated upright at 90 degrees   CHL IP OTHER RECOMMENDATIONS 04/12/2016 Recommended Consults Consider esophageal assessment Oral Care Recommendations Oral care BID Other Recommendations --   No flowsheet data found.  CHL IP FREQUENCY  AND DURATION 04/12/2016 Speech Therapy Frequency (ACUTE ONLY) min 1 x/week Treatment Duration 1 week      CHL IP ORAL PHASE 04/12/2016 Oral Phase Impaired Oral - Pudding Teaspoon -- Oral - Pudding Cup -- Oral - Honey Teaspoon -- Oral - Honey Cup -- Oral - Nectar Teaspoon -- Oral - Nectar Cup WFL;Holding of bolus Oral - Nectar Straw -- Oral - Thin Teaspoon -- Oral - Thin Cup Holding of bolus Oral - Thin Straw Holding of bolus Oral - Puree Holding of bolus;Other (Comment) Oral - Mech Soft Delayed oral transit Oral - Regular -- Oral - Multi-Consistency -- Oral - Pill WFL Oral Phase - Comment --  CHL IP PHARYNGEAL PHASE 04/12/2016 Pharyngeal Phase Impaired Pharyngeal- Pudding Teaspoon -- Pharyngeal -- Pharyngeal- Pudding Cup -- Pharyngeal -- Pharyngeal- Honey Teaspoon -- Pharyngeal -- Pharyngeal- Honey Cup -- Pharyngeal -- Pharyngeal- Nectar Teaspoon -- Pharyngeal -- Pharyngeal- Nectar Cup WFL Pharyngeal -- Pharyngeal- Nectar Straw -- Pharyngeal -- Pharyngeal- Thin Teaspoon WFL Pharyngeal -- Pharyngeal- Thin Cup WFL Pharyngeal -- Pharyngeal- Thin Straw WFL Pharyngeal -- Pharyngeal- Puree Delayed swallow initiation-vallecula Pharyngeal -- Pharyngeal- Mechanical Soft Delayed swallow initiation-vallecula Pharyngeal -- Pharyngeal- Regular -- Pharyngeal -- Pharyngeal- Multi-consistency --  Pharyngeal -- Pharyngeal- Pill WFL Pharyngeal -- Pharyngeal Comment --  CHL IP CERVICAL ESOPHAGEAL PHASE 04/12/2016 Cervical Esophageal Phase Impaired Pudding Teaspoon -- Pudding Cup -- Honey Teaspoon -- Honey Cup -- Nectar Teaspoon -- Nectar Cup -- Nectar Straw -- Thin Teaspoon -- Thin Cup -- Thin Straw -- Puree -- Mechanical Soft -- Regular -- Multi-consistency -- Pill -- Cervical Esophageal Comment appearance of delayed esophageal clearance with retrograde propulsion without pt awareness; water provided with appearance of fluid line; could not view to GE due to xray equipment; suspect primary esophageal deficits  No flowsheet data found.  Luanna Salk, MS [facility] SLP [phone]                 Subjective:   Discharge Exam: Vitals:   04/13/16 0417 04/13/16 1500  BP: (!) 150/85 (!) 155/77  Pulse: 76 70  Resp: 19 20  Temp: 97.9 F (36.6 C) 97.5 F (36.4 C)   Vitals:   04/12/16 1610 04/12/16 2053 04/13/16 0417 04/13/16 1500  BP:  (!) 153/87 (!) 150/85 (!) 155/77  Pulse:  76 76 70  Resp:  18 19 20   Temp:  99.1 F (37.3 C) 97.9 F (36.6 C) 97.5 F (36.4 C)  TempSrc:  Axillary Oral Oral  SpO2: 98% 95% 95% 95%  Weight:      Height:        General: Pt is alert, non verbal at baseline  Cardiovascular: RRR, S1/S2 +, no rubs, no gallops Respiratory: CTA bilaterally, no wheezing, few  rhonchi Abdominal: Soft, NT, ND, bowel sounds + Extremities: no edema, no cyanosis    The results of significant diagnostics from this hospitalization (including imaging, microbiology, ancillary and laboratory) are listed below for reference.     Microbiology: Recent Results (from the past 240 hour(s))  Culture, blood (Routine x 2)     Status: None (Preliminary result)   Collection Time: 04/10/16  7:12 PM  Result Value Ref Range Status   Specimen Description BLOOD LEFT ARM  Final   Special Requests BOTTLES DRAWN AEROBIC AND ANAEROBIC 5CC  Final   Culture   Final    NO GROWTH 3 DAYS Performed at [facility]    Report Status PENDING  Incomplete  Urine culture     Status: Abnormal   Collection Time: 04/10/16  7:12 PM  Result Value Ref Range Status   Specimen Description URINE, RANDOM  Final   Special Requests NONE  Final   Culture MULTIPLE SPECIES PRESENT, SUGGEST RECOLLECTION (A)  Final   Report Status 04/12/2016 FINAL  Final  Culture, blood (Routine x 2)     Status: None (Preliminary result)   Collection Time: 04/10/16  7:17 PM  Result Value Ref Range Status   Specimen Description BLOOD LEFT HAND  Final   Special Requests BOTTLES DRAWN AEROBIC AND ANAEROBIC 5CC  Final   Culture   Final    NO GROWTH 3  DAYS Performed at [facility]    Report Status PENDING  Incomplete  MRSA PCR Screening     Status: None   Collection Time: 04/11/16  9:49 PM  Result Value Ref Range Status   MRSA by PCR NEGATIVE NEGATIVE Final    Comment:        The GeneXpert MRSA Assay (FDA approved for NASAL specimens only), is one component of a comprehensive MRSA colonization surveillance program. It is not intended to diagnose MRSA infection nor to guide or monitor treatment for MRSA infections.  Labs: BNP (last 3 results) No results for input(s): BNP in the last 8760 hours. Basic Metabolic Panel:  Recent Labs Lab 04/10/16 1912 04/11/16 0149 04/12/16 1008 04/12/16 1635 04/13/16 0924  NA 140 144 140  --  140  K 3.1* 3.2* 3.4* 3.5 3.9  CL 107 113* 112*  --  110  CO2 25 24 24   --  24  GLUCOSE 154* 109* 110*  --  107*  BUN 18 15 8   --  8  CREATININE 0.87 0.71 0.73  --  0.69  CALCIUM 9.5 8.6* 8.5*  --  8.6*  MG  --   --   --  1.7  --    Liver Function Tests:  Recent Labs Lab 04/10/16 1912  AST 21  ALT 14  ALKPHOS 105  BILITOT 0.9  PROT 7.6  ALBUMIN 3.8   No results for input(s): LIPASE, AMYLASE in the last 168 hours. No results for input(s): AMMONIA in the last 168 hours. CBC:  Recent Labs Lab 04/10/16 1912 04/11/16 0149  WBC 12.2* 9.1  NEUTROABS 10.5*  --   HGB 14.6 14.0  HCT 43.4 41.5  MCV 93.9 95.0  PLT 182 151   Cardiac Enzymes: No results for input(s): CKTOTAL, CKMB, CKMBINDEX, TROPONINI in the last 168 hours. BNP: Invalid input(s): POCBNP CBG: No results for input(s): GLUCAP in the last 168 hours. D-Dimer No results for input(s): DDIMER in the last 72 hours. Hgb A1c No results for input(s): HGBA1C in the last 72 hours. Lipid Profile No results for input(s): CHOL, HDL, LDLCALC, TRIG, CHOLHDL, LDLDIRECT in the last 72 hours. Thyroid function studies No results for input(s): TSH, T4TOTAL, T3FREE, THYROIDAB in the last 72 hours.  Invalid input(s):  FREET3 Anemia work up No results for input(s): VITAMINB12, FOLATE, FERRITIN, TIBC, IRON, RETICCTPCT in the last 72 hours. Urinalysis    Component Value Date/Time   COLORURINE YELLOW 04/10/2016 1912   APPEARANCEUR CLOUDY (A) 04/10/2016 1912   LABSPEC 1.024 04/10/2016 1912   PHURINE 5.5 04/10/2016 1912   GLUCOSEU NEGATIVE 04/10/2016 1912   HGBUR NEGATIVE 04/10/2016 1912   BILIRUBINUR NEGATIVE 04/10/2016 1912   KETONESUR NEGATIVE 04/10/2016 1912   PROTEINUR NEGATIVE 04/10/2016 1912   NITRITE POSITIVE (A) 04/10/2016 1912   LEUKOCYTESUR SMALL (A) 04/10/2016 1912   Sepsis Labs Invalid input(s): PROCALCITONIN,  WBC,  LACTICIDVEN Microbiology Recent Results (from the past 240 hour(s))  Culture, blood (Routine x 2)     Status: None (Preliminary result)   Collection Time: 04/10/16  7:12 PM  Result Value Ref Range Status   Specimen Description BLOOD LEFT ARM  Final   Special Requests BOTTLES DRAWN AEROBIC AND ANAEROBIC 5CC  Final   Culture   Final    NO GROWTH 3 DAYS Performed at [facility]    Report Status PENDING  Incomplete  Urine culture     Status: Abnormal   Collection Time: 04/10/16  7:12 PM  Result Value Ref Range Status   Specimen Description URINE, RANDOM  Final   Special Requests NONE  Final   Culture MULTIPLE SPECIES PRESENT, SUGGEST RECOLLECTION (A)  Final   Report Status 04/12/2016 FINAL  Final  Culture, blood (Routine x 2)     Status: None (Preliminary result)   Collection Time: 04/10/16  7:17 PM  Result Value Ref Range Status   Specimen Description BLOOD LEFT HAND  Final   Special Requests BOTTLES DRAWN AEROBIC AND ANAEROBIC 5CC  Final   Culture   Final  NO GROWTH 3 DAYS Performed at [facility]    Report Status PENDING  Incomplete  MRSA PCR Screening     Status: None   Collection Time: 04/11/16  9:49 PM  Result Value Ref Range Status   MRSA by PCR NEGATIVE NEGATIVE Final    Comment:        The GeneXpert MRSA Assay (FDA approved for NASAL  specimens only), is one component of a comprehensive MRSA colonization surveillance program. It is not intended to diagnose MRSA infection nor to guide or monitor treatment for MRSA infections.      Time coordinating discharge: Over 30 minutes  SIGNED:   Elmarie Shiley, MD  [facility] 04/13/2016, 4:45 PM Pager [phone]  If 7PM-7AM, please contact night-coverage [email or web address] Password [email or web address]

## 2016-04-13 NOTE — Progress Notes (Signed)
[facility].  EMS transporting.  Patient has no s/s of pain or distress.  Room air.  Given Keflex prior to discharge.  Accompanied by sister.  Leaving with personal belongings.

## 2016-04-13 NOTE — Progress Notes (Signed)
Attempted to call report to [facility], [place], [phone] at 1616.  RN not available.  Facility reported to me she would call me back for report.  No return phone call was received.

## 2016-04-13 NOTE — NC FL2 (Signed)
[place] MEDICAID FL2 LEVEL OF CARE SCREENING TOOL     IDENTIFICATION  Patient Name: Brittany Powers Birthdate: 04/27/1931 Sex: female Admission Date (Current Location): 04/10/2016  [facility] [place]   Facility and Address:  [facility],  [address], [place] [id number]      Provider Number: [id number]  Attending Physician Name and Address:  Alba CoryBelkys A Regalado, MD  Relative Name and Phone Number:       Current Level of Care: Hospital Recommended Level of Care: Memory Care (Locked secured unit) Prior Approval Number:    Date Approved/Denied:   PASRR Number:    Discharge Plan: Memory Care, locked secured unit.    Current Diagnoses: Patient Active Problem List   Diagnosis Date Noted  . Sepsis (HCC) 04/10/2016  . UTI (lower urinary tract infection) 04/10/2016  . Leukocytosis 04/10/2016  . Fever 04/10/2016  . Epidural hematoma (HCC) 02/08/2015  . Fall at nursing home 02/08/2015  . HTN (hypertension) 02/08/2015  . Aphasia as late effect of cerebrovascular accident 02/08/2015  . Dementia 02/08/2015   Alzheimer's Dementia           02/08/2015  Orientation RESPIRATION BLADDER Height & Weight     Self  Normal Incontinent Weight: 69.9 kg (154 lb) Height:  5\' 3"  (160 cm)  BEHAVIORAL SYMPTOMS/MOOD NEUROLOGICAL BOWEL NUTRITION STATUS   (NONE)  (NONE) Incontinent Diet (Dysphagia 3, no added table salt, thin liquids.)  AMBULATORY STATUS COMMUNICATION OF NEEDS Skin   Limited Assist Verbally Normal                       Personal Care Assistance Level of Assistance  Bathing, Feeding, Dressing Bathing Assistance: Limited assistance Feeding assistance: Independent Dressing Assistance: Limited assistance     Functional Limitations Info  Sight, Hearing, Speech Sight Info: Adequate Hearing Info: Adequate Speech Info: Adequate    SPECIAL CARE FACTORS FREQUENCY                       Contractures Contractures Info: Not  present    Additional Factors Info  Code Status, Allergies Code Status Info: DNR Allergies Info: Bee Venom           Current Medications (04/13/2016):  This is the current hospital active medication list Current Facility-Administered Medications  Medication Dose Route Frequency Provider Last Rate Last Dose  . acetaminophen (TYLENOL) tablet 500 mg  500 mg Oral Q4H PRN Michael LitterNikki Carter, MD   500 mg at 04/11/16 2153  . acetaminophen (TYLENOL) tablet 500 mg  500 mg Oral BID Belkys A Regalado, MD   500 mg at 04/13/16 1122  . cefTRIAXone (ROCEPHIN) 1 g in dextrose 5 % 50 mL IVPB  1 g Intravenous Q24H Belkys A Regalado, MD   1 g at 04/12/16 2252  . donepezil (ARICEPT) tablet 5 mg  5 mg Oral QHS Michael LitterNikki Carter, MD   5 mg at 04/12/16 2252  . enoxaparin (LOVENOX) injection 40 mg  40 mg Subcutaneous Q24H Belkys A Regalado, MD   40 mg at 04/12/16 1751  . guaiFENesin (MUCINEX) 12 hr tablet 600 mg  600 mg Oral BID Belkys A Regalado, MD   600 mg at 04/13/16 1122  . levalbuterol (XOPENEX) nebulizer solution 0.63 mg  0.63 mg Nebulization Q6H PRN Belkys A Regalado, MD      . ondansetron (ZOFRAN) tablet 4 mg  4 mg Oral Q6H PRN Michael LitterNikki Carter, MD  Or  . ondansetron (ZOFRAN) injection 4 mg  4 mg Intravenous Q6H PRN Michael LitterNikki Carter, MD      . potassium chloride 20 MEQ/15ML (10%) solution 20 mEq  20 mEq Oral Daily Belkys A Regalado, MD   20 mEq at 04/13/16 1123  . sodium chloride flush (NS) 0.9 % injection 3 mL  3 mL Intravenous Q12H Michael LitterNikki Carter, MD   3 mL at 04/12/16 2253  . traMADol (ULTRAM) tablet 50 mg  50 mg Oral Q6H PRN Michael LitterNikki Carter, MD      . verapamil (CALAN) tablet 80 mg  80 mg Oral TID Michael LitterNikki Carter, MD   80 mg at 04/13/16 1122     Discharge Medications: Please see discharge summary for a list of discharge medications.  Relevant Imaging Results:  Relevant Lab Results:   Additional Information    Venita Lickampbell, Elsie Sakuma B, LCSW

## 2016-04-13 NOTE — Clinical Social Work Note (Signed)
Per MD [facility] RN, patient/family, and facility notified of patient's DC. RN given number for report. DC packet on patient's chart. Ambulance transport requested for patient. Updated DC Summary put with DC packet per MD's request. CSW signing off at this time.   Roddie McBryant Mileydi Milsap MSW, [place], [place], [phone]

## 2016-04-15 LAB — CULTURE, BLOOD (ROUTINE X 2)
CULTURE: NO GROWTH
Culture: NO GROWTH

## 2016-10-18 IMAGING — CT CT HEAD W/O CM
1 series · 15 of 30 positions shown, 19 images · non-contrast
Comparison: CT head February 08, 2015

CLINICAL DATA: Follow-up epidural hematoma. History of stroke,
hypertension, dementia.

EXAM:
CT HEAD WITHOUT CONTRAST
TECHNIQUE: Contiguous axial images were obtained from the base of the skull
through the vertex without intravenous contrast.

[Series 2: head 5.0 h30s · axial · 0.42mm/px · z∈[-141,-1]mm · 15 of 32 slices shown, 19 images]
[im 2/32  brain]
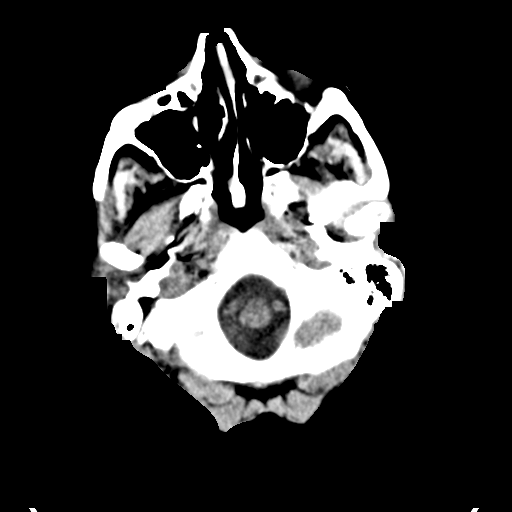
[im 2/32  bone]
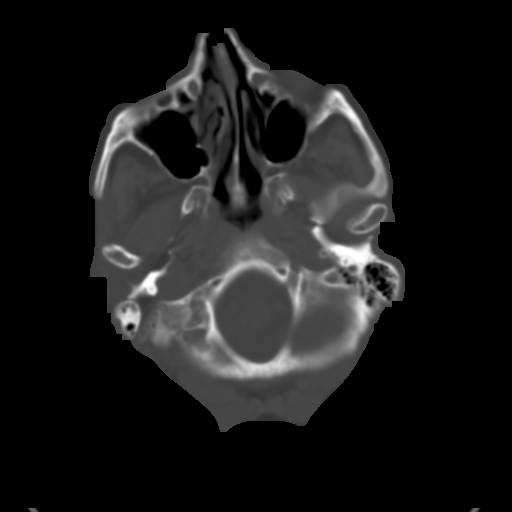
[im 4/32  brain]
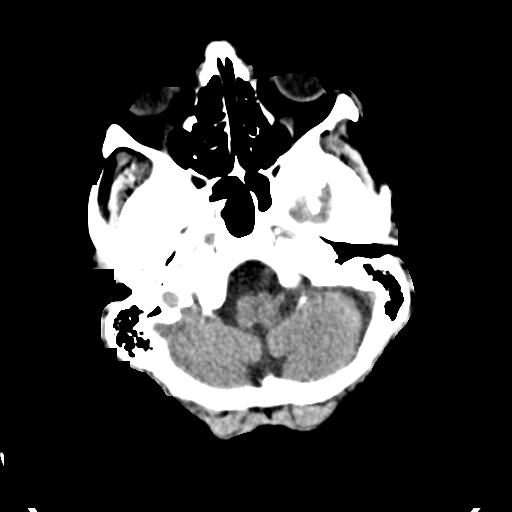
[im 6/32  brain]
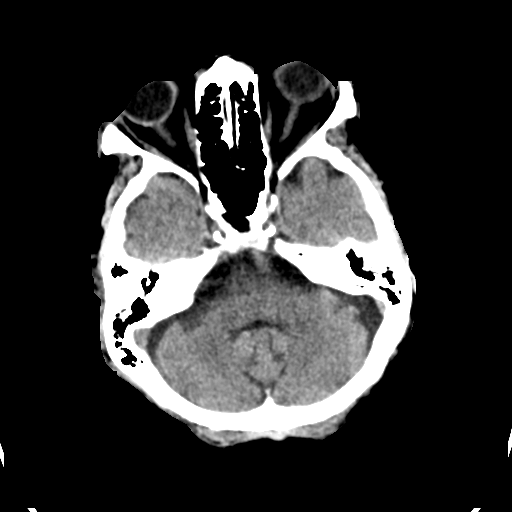
[im 8/32  brain]
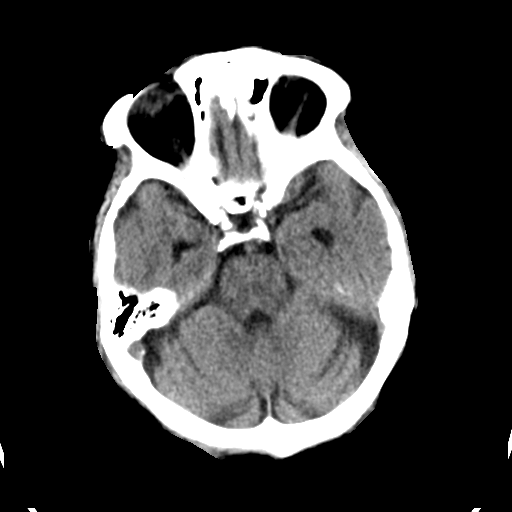
[im 10/32  brain]
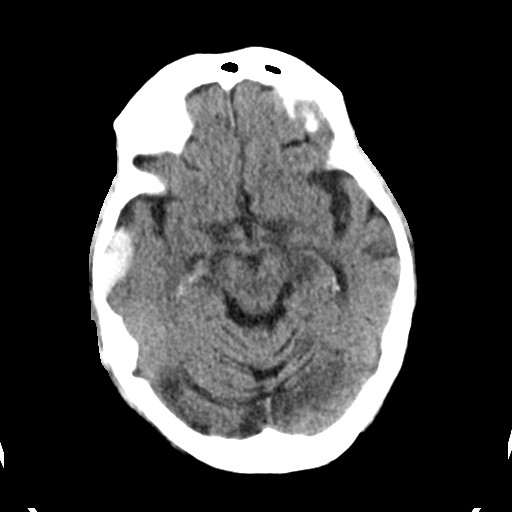
[im 10/32  bone]
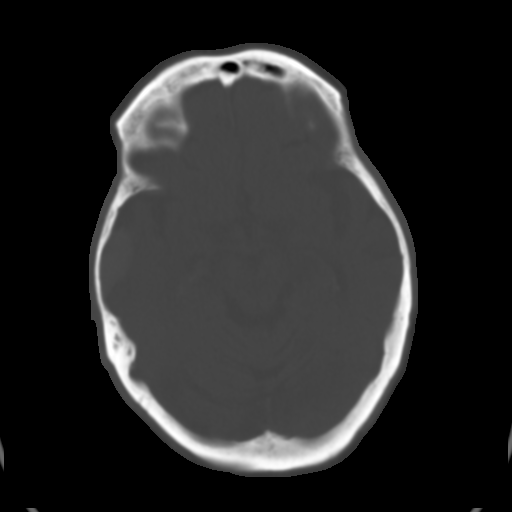
[im 12/32  brain]
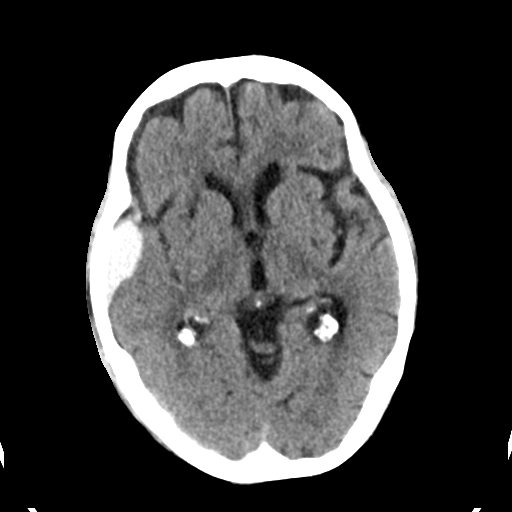
[im 14/32  brain]
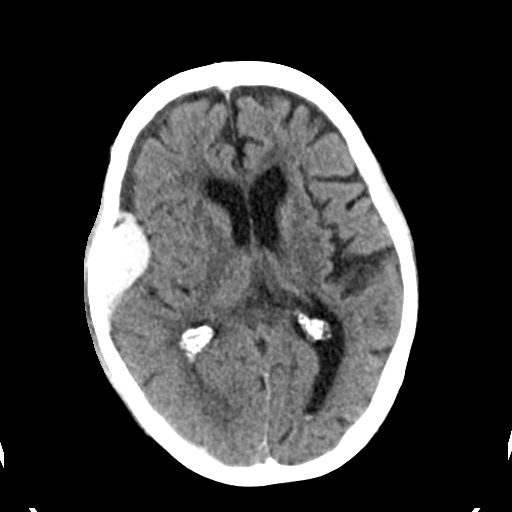
[im 17/32  brain]
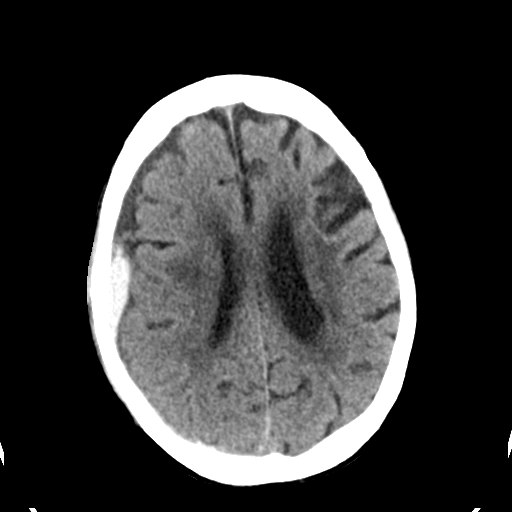
[im 18/32  brain]
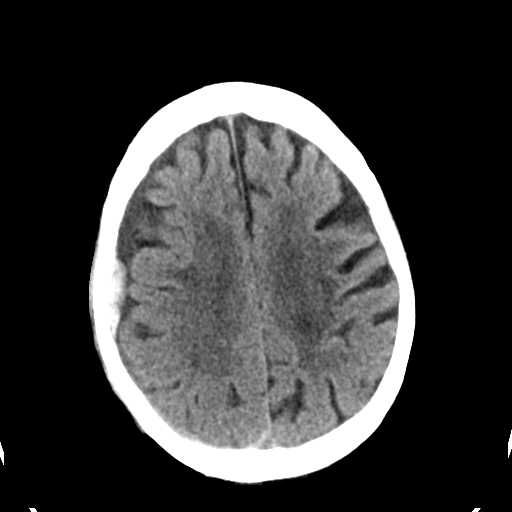
[im 18/32  bone]
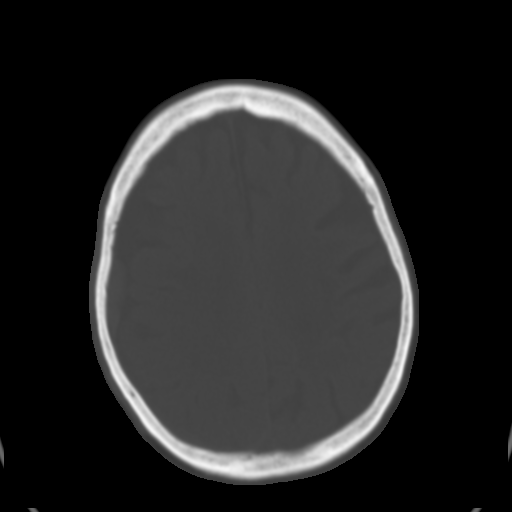
[im 20/32  brain]
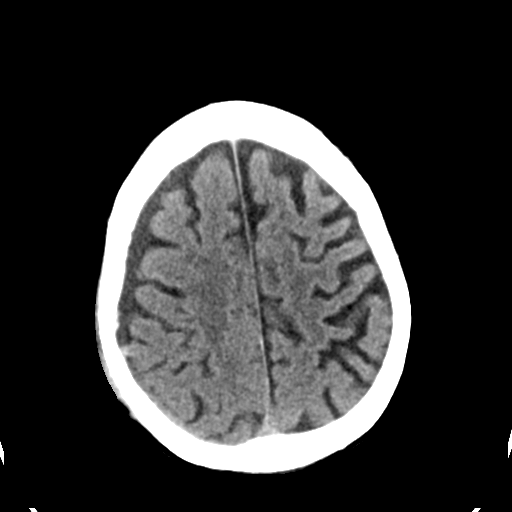
[im 22/32  brain]
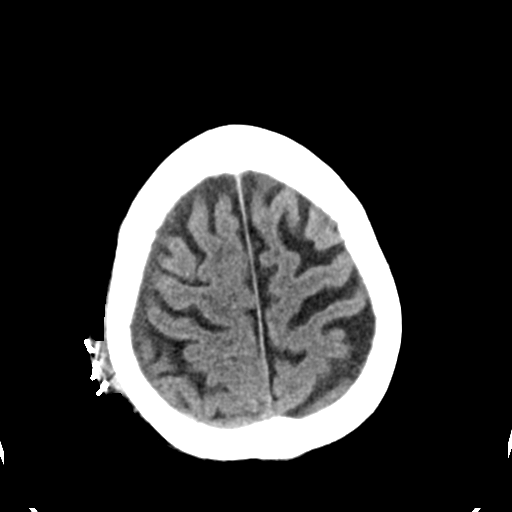
[im 24/32  brain]
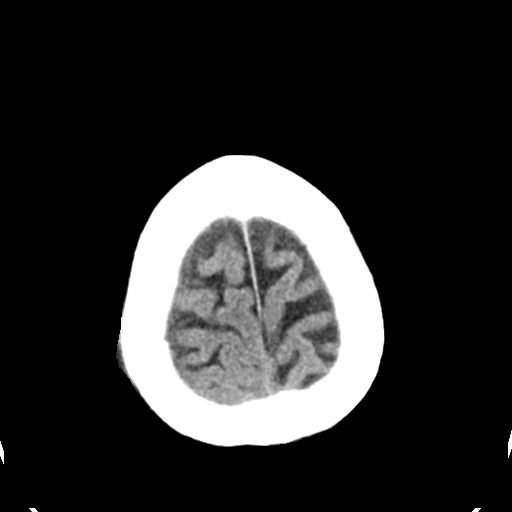
[im 26/32  brain]
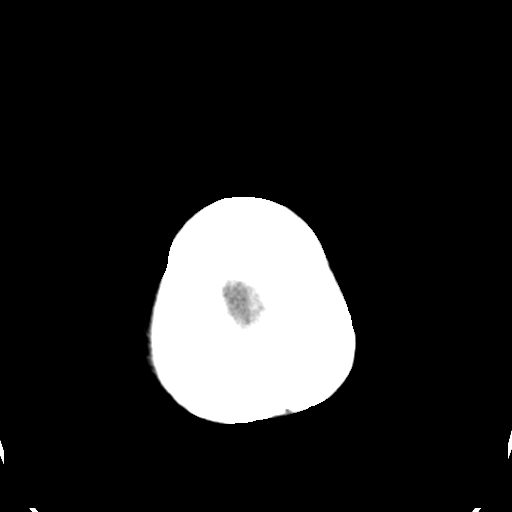
[im 26/32  bone]
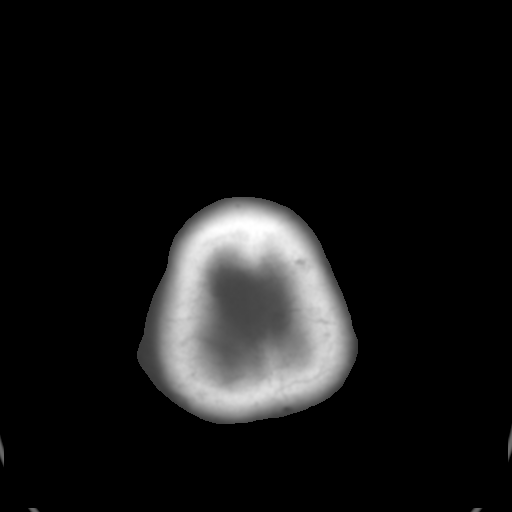
[im 28/32  brain]
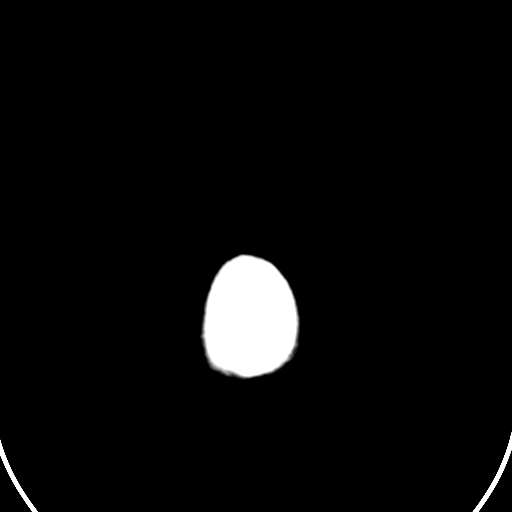
[im 30/32  brain]
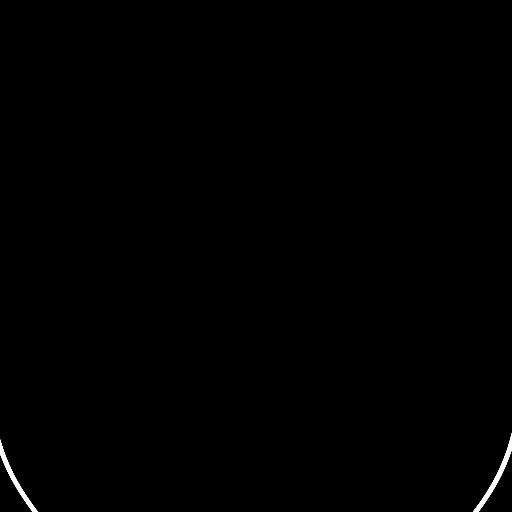

[15 of 30 positions shown; findings below may reference images not displayed]

FINDINGS: Dense 19 mm transverse RIGHT frontotemporal lentiform extra-axial
fluid collection, in addition to 2-3 mm RIGHT parietal subdural
hematoma. 3 mm RIGHT to LEFT midline shift. Small amount of RIGHT
frontal, bilateral occipital horn intraventricular blood products
without hydrocephalus. Patchy to confluent supratentorial white
matter hypodensities most consistent with chronic small vessel
ischemic disease. No acute large vascular territory infarct.

Basal cisterns are patent. Moderate calcific atherosclerosis of the
carotid siphons. Paranasal sinuses are well-aerated, frontal osteoma
versus calcified mucosal retention cyst. The mastoid air cells are
well aerated. No skull fracture. Moderate RIGHT scalp hematoma.
IMPRESSION: Similar lentiform RIGHT frontotemporal extra-axial hematoma, likely
subdural with 2-3 mm RIGHT parietal subdural component. No skull
fracture.

Similar 3 mm RIGHT to LEFT midline shift. Small amount of
intraventricular blood products without hydrocephalus.

Stable chronic changes including moderate to severe white matter
changes most consistent with chronic small vessel ischemic disease.

## 2017-11-25 ENCOUNTER — Emergency Department (HOSPITAL_COMMUNITY)
Admission: EM | Admit: 2017-11-25 | Discharge: 2017-11-25 | Disposition: A | Discharge status: Home / Self Care | Payer: Medicare Other | Attending: Emergency Medicine | Admitting: Emergency Medicine

## 2017-11-25 ENCOUNTER — Emergency Department (HOSPITAL_COMMUNITY): Payer: Medicare Other

## 2017-11-25 ENCOUNTER — Encounter (HOSPITAL_COMMUNITY): Payer: Self-pay | Admitting: Emergency Medicine

## 2017-11-25 ENCOUNTER — Other Ambulatory Visit: Payer: Self-pay

## 2017-11-25 DIAGNOSIS — F0390 Unspecified dementia without behavioral disturbance: Secondary | ICD-10-CM | POA: Diagnosis not present

## 2017-11-25 DIAGNOSIS — I10 Essential (primary) hypertension: Secondary | ICD-10-CM | POA: Diagnosis not present

## 2017-11-25 DIAGNOSIS — Y92129 Unspecified place in nursing home as the place of occurrence of the external cause: Secondary | ICD-10-CM | POA: Diagnosis not present

## 2017-11-25 DIAGNOSIS — J449 Chronic obstructive pulmonary disease, unspecified: Secondary | ICD-10-CM | POA: Insufficient documentation

## 2017-11-25 DIAGNOSIS — Z79899 Other long term (current) drug therapy: Secondary | ICD-10-CM | POA: Diagnosis not present

## 2017-11-25 DIAGNOSIS — W19XXXA Unspecified fall, initial encounter: Secondary | ICD-10-CM | POA: Diagnosis not present

## 2017-11-25 DIAGNOSIS — Z87891 Personal history of nicotine dependence: Secondary | ICD-10-CM | POA: Insufficient documentation

## 2017-11-25 DIAGNOSIS — Y999 Unspecified external cause status: Secondary | ICD-10-CM | POA: Diagnosis not present

## 2017-11-25 DIAGNOSIS — S0083XA Contusion of other part of head, initial encounter: Secondary | ICD-10-CM | POA: Diagnosis not present

## 2017-11-25 DIAGNOSIS — Y939 Activity, unspecified: Secondary | ICD-10-CM | POA: Insufficient documentation

## 2017-11-25 DIAGNOSIS — S0990XA Unspecified injury of head, initial encounter: Secondary | ICD-10-CM | POA: Diagnosis present

## 2017-11-25 HISTORY — DX: Chronic obstructive pulmonary disease, unspecified: J44.9

## 2017-11-25 NOTE — ED Triage Notes (Signed)
Pt comes to ed. Via [facility] c/o fall unwitnessed.found at 0120. Pt not on blood thinners. Hx of COPD, dementia, and at baseline is non verbal.  gcs of 11 at baseline. Vs on arrival bp 174, pulse 68, rr 22. spo2 98 room air.  assesment findings include hematoma over left eye. From [facility] on [address].

## 2017-11-25 NOTE — Discharge Instructions (Addendum)
Ice packs will help with the swelling if she will let you. Have her rechecked for any problems in the head injury information. She can have the tylenol for pain if needed.

## 2017-11-25 NOTE — ED Notes (Signed)
[facility] called for transport.  

## 2017-11-25 NOTE — ED Provider Notes (Signed)
[facility]-EMERGENCY DEPT Provider Note   CSN: [id number] Arrival date & time: 11/25/17  0157  Time seen 02:45 AM   History   Chief Complaint Chief Complaint  Patient presents with  . Fall   Level 5 caveat for dementia  HPI GENIYAH EISCHEID is a 84 y.o. female.  HPI patient's sister is at bedside.  She states her sister has dementia, the last time she was able to ambulate was about 2 years ago, now they use a wheelchair to get her around in her nursing facility.  The last time she was verbal was about a year ago.  When the nursing staff is making the rounds tonight they found the patient on the floor in her bedroom.  She has some swelling around her left eye.  They have not noticed any other injury.  Patient is not able to communicate if she is having pain.  PCP Kari Baars, MD   Past Medical History:  Diagnosis Date  . COPD (chronic obstructive pulmonary disease) (HCC)   . CVA (cerebral infarction)   . Dementia   . High cholesterol   . Hypertension   . Incontinent of urine     Patient Active Problem List   Diagnosis Date Noted  . Sepsis (HCC) 04/10/2016  . UTI (lower urinary tract infection) 04/10/2016  . Leukocytosis 04/10/2016  . Fever 04/10/2016  . Epidural hematoma (HCC) 02/08/2015  . Fall at nursing home 02/08/2015  . HTN (hypertension) 02/08/2015  . Aphasia as late effect of cerebrovascular accident 02/08/2015  . Dementia 02/08/2015    Past Surgical History:  Procedure Laterality Date  . NO PAST SURGERIES       OB History   None      Home Medications    Prior to Admission medications   Medication Sig Start Date End Date Taking? Authorizing Provider  acetaminophen (TYLENOL) 500 MG tablet Take 500 mg by mouth every 4 (four) hours as needed for mild pain, moderate pain, fever or headache.     [provider]  alum & mag hydroxide-simeth (MINTOX) 200-200-20 MG/5ML suspension Take 30 mLs by mouth as needed for  indigestion or heartburn.    [provider]  calcium carbonate (OS-CAL - DOSED IN MG OF ELEMENTAL CALCIUM) 1250 (500 CA) MG tablet Take 1 tablet (500 mg of elemental calcium total) by mouth daily with breakfast. 02/09/15   Otis Brace, MD  cephALEXin (KEFLEX) 500 MG capsule Take 1 capsule (500 mg total) by mouth 4 (four) times daily. 04/13/16   Regalado, Belkys A, MD  donepezil (ARICEPT) 5 MG tablet Take 5 mg by mouth at bedtime.     [provider]  fish oil-omega-3 fatty acids 1000 MG capsule Take 1 g by mouth 2 (two) times daily.    [provider]  guaifenesin (ROBITUSSIN) 100 MG/5ML syrup Take 200 mg by mouth every 6 (six) hours as needed for cough.     [provider]  hydrochlorothiazide (HYDRODIURIL) 12.5 MG tablet Take 12.5 mg by mouth daily with breakfast.    [provider]  levalbuterol (XOPENEX) 0.63 MG/3ML nebulizer solution Take 3 mLs (0.63 mg total) by nebulization every 6 (six) hours as needed for wheezing or shortness of breath. 04/13/16   Regalado, Belkys A, MD  magnesium hydroxide (MILK OF MAGNESIA) 400 MG/5ML suspension Take 30 mLs by mouth at bedtime as needed for mild constipation.     [provider]  neomycin-bacitracin-polymyxin (NEOSPORIN) OINT Apply 1 application topically daily  as needed for wound care.    [provider]  Skin Protectants, Misc. (BAZA PROTECT EX) Apply 1 application topically as needed (with every incontinent episode). Apply to groin/buttocks    [provider]  traMADol (ULTRAM) 50 MG tablet Take 50 mg by mouth every 6 (six) hours as needed for moderate pain.     [provider]  verapamil (CALAN) 80 MG tablet Take 80 mg by mouth 3 (three) times daily.     [provider]  Vitamin D, Ergocalciferol, (DRISDOL) 50000 UNITS CAPS capsule Take 50,000 Units by mouth every 30 (thirty) days.     [provider]    Family History No family history on  file.  Social History Social History   Tobacco Use  . Smoking status: Former Smoker    Types: Cigarettes    Last attempt to quit: 12/26/2014    Years since quitting: 2.9  . Smokeless tobacco: Never Used  . Tobacco comment: " smoked most of her life "  Substance Use Topics  . Alcohol use: No  . Drug use: No  lives in a NH   Allergies   Bee venom   Review of Systems Review of Systems  Unable to perform ROS: Dementia     Physical Exam Updated Vital Signs BP (!) 151/69   Pulse 69   Temp (!) 97.5 F (36.4 C) (Oral)   Resp 13   Ht 5\' 2"  (1.575 m)   Wt 69.9 kg (154 lb)   SpO2 95%   BMI 28.17 kg/m   Vital signs normal    Physical Exam  Constitutional: She appears well-developed and well-nourished.  HENT:  Head: Normocephalic.  Right Ear: External ear normal.  Left Ear: External ear normal.  Nose: Nose normal.  Mouth/Throat: Oropharynx is clear and moist.  Patient is noted to have some swelling especially around her medial left eyebrow.  There may be some mild swelling of her lower left eyelid/cheek area.  She is moving her head freely from side to side to look from her sister to me.  Eyes: Pupils are equal, round, and reactive to light. Conjunctivae and EOM are normal.  Neck: Normal range of motion. Neck supple.  Cardiovascular: Normal rate and regular rhythm.  Pulmonary/Chest: Effort normal and breath sounds normal. No respiratory distress.  Abdominal: Soft. Bowel sounds are normal. She exhibits no distension.  Musculoskeletal: Normal range of motion. She exhibits no edema, tenderness or deformity.  Neurological: She is alert.  Skin: Skin is warm and dry. No rash noted. No erythema. No pallor.  Psychiatric: She is noncommunicative.  Nursing note and vitals reviewed.    ED Treatments / Results  Labs (all labs ordered are listed, but only abnormal results are displayed) Labs Reviewed - No data to display  EKG None  Radiology Ct Head Wo Contrast Ct  Maxillofacial Wo Cm  Result Date: 11/25/2017 CLINICAL DATA:  Unwitnessed fall with hematoma to the left eye EXAM: CT HEAD WITHOUT CONTRAST CT MAXILLOFACIAL WITHOUT CONTRAST TECHNIQUE: Multidetector CT imaging of the head and maxillofacial structures were performed using the standard protocol without intravenous contrast. Multiplanar CT image reconstructions of the maxillofacial structures were also generated. COMPARISON:  CT brain 06/30/2015, CT facial 10/06/2012 FINDINGS: CT HEAD FINDINGS Brain: No acute territorial infarction, hemorrhage or intracranial mass is visualized. Moderate atrophy. Mild to moderate small vessel ischemic changes of the white matter. Old appearing lacunar infarcts in the bilateral basal ganglia. Stable ventricle size Vascular: No hyperdense vessels.  Carotid  vascular calcification Skull: No depressed skull fracture Other: Small left forehead hematoma CT MAXILLOFACIAL FINDINGS Osseous: Zygomatic arches are intact. Mandibular heads are normally position. No mandibular fracture. No acute nasal bone fracture. Pterygoid plates show no fracture. Orbits: Negative. No traumatic or inflammatory finding. Sinuses: Large calcified mass in the frontal sinus most likely osteoma, no change. Mild mucosal thickening in the ethmoid and maxillary sinuses. No acute fluid level. No sinus wall fracture Soft tissues: Mild left forehead hematoma IMPRESSION: 1. No CT evidence for acute intracranial abnormality. Atrophy and small vessel ischemic changes of the white matter 2. Small left forehead hematoma.  No acute facial bone fracture 3. Large calcified mass in the frontal sinus, no change, likely osteoma. Electronically Signed   By: Jasmine Pang M.D.   On: 11/25/2017 03:56    Procedures Procedures (including critical care time)  Medications Ordered in ED Medications - No data to display   Initial Impression / Assessment and Plan / ED Course  I have reviewed the triage vital signs and the nursing  notes.  Pertinent labs & imaging results that were available during my care of the patient were reviewed by me and considered in my medical decision making (see chart for details).     Sister wanted to proceed with CT scan, CT of the head and maxillofacial was ordered.  I did not order CT scan of the cervical spine because patient is moving her head freely without apparent discomfort.  4:20 AM I gave the CT results to the patient's sister.  She was transported back to her nursing facility.  Final Clinical Impressions(s) / ED Diagnoses   Final diagnoses:  Fall at nursing home, initial encounter  Contusion of face, initial encounter    ED Discharge Orders    None    OTC  Acetaminophen  Plan discharge  Devoria Albe, MD, Concha Pyo, MD 11/25/17 [phone]

## 2018-02-21 ENCOUNTER — Emergency Department (HOSPITAL_COMMUNITY): Payer: Medicare Other

## 2018-02-21 ENCOUNTER — Other Ambulatory Visit: Payer: Self-pay

## 2018-02-21 ENCOUNTER — Encounter (HOSPITAL_COMMUNITY): Payer: Self-pay | Admitting: Emergency Medicine

## 2018-02-21 ENCOUNTER — Emergency Department (HOSPITAL_COMMUNITY)
Admission: EM | Admit: 2018-02-21 | Discharge: 2018-02-21 | Disposition: A | Discharge status: Home / Self Care | Payer: Medicare Other | Attending: Emergency Medicine | Admitting: Emergency Medicine

## 2018-02-21 DIAGNOSIS — Y92122 Bedroom in nursing home as the place of occurrence of the external cause: Secondary | ICD-10-CM | POA: Insufficient documentation

## 2018-02-21 DIAGNOSIS — F0390 Unspecified dementia without behavioral disturbance: Secondary | ICD-10-CM | POA: Insufficient documentation

## 2018-02-21 DIAGNOSIS — Y939 Activity, unspecified: Secondary | ICD-10-CM | POA: Insufficient documentation

## 2018-02-21 DIAGNOSIS — W19XXXA Unspecified fall, initial encounter: Secondary | ICD-10-CM | POA: Diagnosis not present

## 2018-02-21 DIAGNOSIS — I10 Essential (primary) hypertension: Secondary | ICD-10-CM | POA: Diagnosis not present

## 2018-02-21 DIAGNOSIS — J449 Chronic obstructive pulmonary disease, unspecified: Secondary | ICD-10-CM | POA: Diagnosis not present

## 2018-02-21 DIAGNOSIS — Y999 Unspecified external cause status: Secondary | ICD-10-CM | POA: Insufficient documentation

## 2018-02-21 DIAGNOSIS — Z79899 Other long term (current) drug therapy: Secondary | ICD-10-CM | POA: Diagnosis not present

## 2018-02-21 DIAGNOSIS — Z8673 Personal history of transient ischemic attack (TIA), and cerebral infarction without residual deficits: Secondary | ICD-10-CM | POA: Diagnosis not present

## 2018-02-21 DIAGNOSIS — Z87891 Personal history of nicotine dependence: Secondary | ICD-10-CM | POA: Insufficient documentation

## 2018-02-21 NOTE — ED Triage Notes (Signed)
[facility], looks as though she rolled out of bed.  Hx of dementia and non-verbal which is baseline. No indications of pain or deformities. BP166/74 HR61 O2:97 RR16 [id number]

## 2018-02-21 NOTE — Discharge Instructions (Signed)
You were evaluated in the emergency department for a fall at your facility.  You had a CAT scan of your head and your cervical spine that did not show any acute findings.  Please return to the emergency department if any worsening symptoms.

## 2018-02-21 NOTE — ED Provider Notes (Signed)
[facility]-EMERGENCY DEPT Provider Note   CSN: [id number] Arrival date & time: 02/21/18  0715     History   Chief Complaint Chief Complaint  Patient presents with  . Fall    HPI Hanley HaysBettye G Regal is a 84 y.o. female.  Level 5 caveat secondary to dementia.  84 [facility] with advanced dementia nonverbal at baseline was found by staff out of her bed on the floor between the bed and the dresser.  It was likely she fell sometime during the night.  Staff did not note any signs of trauma but brought her here for evaluation.  Patient is unable to give any history.  Per the paperwork she is DNR.  The history is provided by the nursing home.  Fall  This is a new problem. Episode onset: unknown. Nothing aggravates the symptoms. Nothing relieves the symptoms. She has tried nothing for the symptoms. The treatment provided no relief.    Past Medical History:  Diagnosis Date  . COPD (chronic obstructive pulmonary disease) (HCC)   . CVA (cerebral infarction)   . Dementia   . High cholesterol   . Hypertension   . Incontinent of urine     Patient Active Problem List   Diagnosis Date Noted  . Sepsis (HCC) 04/10/2016  . UTI (lower urinary tract infection) 04/10/2016  . Leukocytosis 04/10/2016  . Fever 04/10/2016  . Epidural hematoma (HCC) 02/08/2015  . Fall at nursing home 02/08/2015  . HTN (hypertension) 02/08/2015  . Aphasia as late effect of cerebrovascular accident 02/08/2015  . Dementia 02/08/2015    Past Surgical History:  Procedure Laterality Date  . NO PAST SURGERIES       OB History   None      Home Medications    Prior to Admission medications   Medication Sig Start Date End Date Taking? Authorizing Provider  acetaminophen (TYLENOL) 500 MG tablet Take 500 mg by mouth every 4 (four) hours as needed for mild pain, moderate pain, fever or headache.     [provider]  alum & mag hydroxide-simeth (MINTOX)  200-200-20 MG/5ML suspension Take 30 mLs by mouth as needed for indigestion or heartburn.    [provider]  calcium carbonate (OS-CAL - DOSED IN MG OF ELEMENTAL CALCIUM) 1250 (500 CA) MG tablet Take 1 tablet (500 mg of elemental calcium total) by mouth daily with breakfast. 02/09/15   Otis Braceabbani, Marjan, MD  cephALEXin (KEFLEX) 500 MG capsule Take 1 capsule (500 mg total) by mouth 4 (four) times daily. 04/13/16   Regalado, Belkys A, MD  donepezil (ARICEPT) 5 MG tablet Take 5 mg by mouth at bedtime.     [provider]  fish oil-omega-3 fatty acids 1000 MG capsule Take 1 g by mouth 2 (two) times daily.    [provider]  guaifenesin (ROBITUSSIN) 100 MG/5ML syrup Take 200 mg by mouth every 6 (six) hours as needed for cough.     [provider]  hydrochlorothiazide (HYDRODIURIL) 12.5 MG tablet Take 12.5 mg by mouth daily with breakfast.    [provider]  levalbuterol (XOPENEX) 0.63 MG/3ML nebulizer solution Take 3 mLs (0.63 mg total) by nebulization every 6 (six) hours as needed for wheezing or shortness of breath. 04/13/16   Regalado, Belkys A, MD  magnesium hydroxide (MILK OF MAGNESIA) 400 MG/5ML suspension Take 30 mLs by mouth at bedtime as needed for mild constipation.     [provider]  neomycin-bacitracin-polymyxin (NEOSPORIN) OINT Apply  1 application topically daily as needed for wound care.    [provider]  Skin Protectants, Misc. (BAZA PROTECT EX) Apply 1 application topically as needed (with every incontinent episode). Apply to groin/buttocks    [provider]  traMADol (ULTRAM) 50 MG tablet Take 50 mg by mouth every 6 (six) hours as needed for moderate pain.     [provider]  verapamil (CALAN) 80 MG tablet Take 80 mg by mouth 3 (three) times daily.     [provider]  Vitamin D, Ergocalciferol, (DRISDOL) 50000 UNITS CAPS capsule Take 50,000 Units by mouth every 30 (thirty) days.     [provider]    Family History History reviewed. No pertinent family history.  Social History Social History   Tobacco Use  . Smoking status: Former Smoker    Types: Cigarettes    Last attempt to quit: 12/26/2014    Years since quitting: 3.1  . Smokeless tobacco: Never Used  . Tobacco comment: " smoked most of her life "  Substance Use Topics  . Alcohol use: No  . Drug use: No     Allergies   Bee venom   Review of Systems Review of Systems  Unable to perform ROS: Dementia     Physical Exam Updated Vital Signs BP (!) 161/76 (BP Location: Left Arm)   Pulse 60   Temp 97.7 F (36.5 C) (Oral)   Resp 15   SpO2 98%   Physical Exam  Constitutional: She appears well-developed and well-nourished.  HENT:  Head: Normocephalic and atraumatic.  Right Ear: External ear normal.  Left Ear: External ear normal.  Nose: Nose normal.  Mouth/Throat: Oropharynx is clear and moist.  Eyes: Pupils are equal, round, and reactive to light. Conjunctivae are normal. Right eye exhibits no discharge. Left eye exhibits no discharge. No scleral icterus.  Neck: Normal range of motion. No tracheal deviation present.  Cardiovascular: Normal rate, regular rhythm, normal heart sounds and intact distal pulses.  Pulmonary/Chest: Effort normal. She has no wheezes. She has no rales.  Abdominal: Soft. She exhibits no mass. There is no tenderness. There is no guarding.  Musculoskeletal: Normal range of motion. She exhibits no tenderness or deformity.  Neurological: She is alert. GCS eye subscore is 4. GCS verbal subscore is 1. GCS motor subscore is 5.  Not following commands but moving all extremities x4  Skin: Skin is warm and dry. Capillary refill takes less than 2 seconds.     ED Treatments / Results  Labs (all labs ordered are listed, but only abnormal results are displayed) Labs Reviewed - No data to display  EKG None  Radiology Ct Head Wo Contrast  Result Date: 02/21/2018 CLINICAL  DATA:  Larey Seat from bed.  Dementia. EXAM: CT HEAD WITHOUT CONTRAST CT CERVICAL SPINE WITHOUT CONTRAST TECHNIQUE: Multidetector CT imaging of the head and cervical spine was performed following the standard protocol without intravenous contrast. Multiplanar CT image reconstructions of the cervical spine were also generated. COMPARISON:  None. Head CT dated 11/25/2017 and cervical spine CT dated 06/30/2015. FINDINGS: CT HEAD FINDINGS Brain: Diffusely enlarged ventricles and subarachnoid spaces. Patchy white matter low density in both cerebral hemispheres. Old pontine and bilateral basal ganglia lacunar infarcts. No intracranial hemorrhage, mass lesion or CT evidence of acute infarction. Vascular: No hyperdense vessel or unexpected calcification. Skull: Normal. Negative for fracture or focal lesion. Sinuses/Orbits: Stable large left frontal sinus osteoma. Unremarkable orbits. Other: None. CT CERVICAL SPINE FINDINGS Alignment: Stable reversal of  the normal lordosis in the lower cervical spine. No significant change in mild anterior subluxations at the C4-5 and C5-6 levels. No acute subluxations. Mild dextroconvex scoliosis without significant change. Skull base and vertebrae: No acute fracture. No primary bone lesion or focal pathologic process. Soft tissues and spinal canal: No prevertebral fluid or swelling. No visible canal hematoma. Disc levels: Multilevel degenerative changes without significant change. Upper chest: Clear lung apices. Other: Stable enlarged and heterogeneous thyroid gland with an exophytic nodule posteriorly measuring 1.4 cm in maximum diameter on image number 73 series 9 and additional smaller left lobe nodules. Bilateral carotid artery calcifications. IMPRESSION: 1. No skull fracture or intracranial hemorrhage. 2. No cervical spine fracture or traumatic subluxation. 3. Stable diffuse cerebral and cerebellar atrophy, chronic small vessel white matter ischemic changes in both cerebral hemispheres and  old bilateral basal ganglia and pontine lacunar infarcts. 4. Stable cervical spine degenerative changes. 5. No significant change in a multinodular thyroid goiter with the largest nodule on the left, measuring 1.4 cm in maximum diameter. Consider further evaluation with thyroid ultrasound. If patient is clinically hyperthyroid, consider nuclear medicine thyroid uptake and scan. 6. Bilateral carotid artery atheromatous calcifications. Electronically Signed   By: Beckie Salts M.D.   On: 02/21/2018 08:52   Ct Cervical Spine Wo Contrast  Result Date: 02/21/2018 CLINICAL DATA:  Larey Seat from bed.  Dementia. EXAM: CT HEAD WITHOUT CONTRAST CT CERVICAL SPINE WITHOUT CONTRAST TECHNIQUE: Multidetector CT imaging of the head and cervical spine was performed following the standard protocol without intravenous contrast. Multiplanar CT image reconstructions of the cervical spine were also generated. COMPARISON:  None. Head CT dated 11/25/2017 and cervical spine CT dated 06/30/2015. FINDINGS: CT HEAD FINDINGS Brain: Diffusely enlarged ventricles and subarachnoid spaces. Patchy white matter low density in both cerebral hemispheres. Old pontine and bilateral basal ganglia lacunar infarcts. No intracranial hemorrhage, mass lesion or CT evidence of acute infarction. Vascular: No hyperdense vessel or unexpected calcification. Skull: Normal. Negative for fracture or focal lesion. Sinuses/Orbits: Stable large left frontal sinus osteoma. Unremarkable orbits. Other: None. CT CERVICAL SPINE FINDINGS Alignment: Stable reversal of the normal lordosis in the lower cervical spine. No significant change in mild anterior subluxations at the C4-5 and C5-6 levels. No acute subluxations. Mild dextroconvex scoliosis without significant change. Skull base and vertebrae: No acute fracture. No primary bone lesion or focal pathologic process. Soft tissues and spinal canal: No prevertebral fluid or swelling. No visible canal hematoma. Disc levels:  Multilevel degenerative changes without significant change. Upper chest: Clear lung apices. Other: Stable enlarged and heterogeneous thyroid gland with an exophytic nodule posteriorly measuring 1.4 cm in maximum diameter on image number 73 series 9 and additional smaller left lobe nodules. Bilateral carotid artery calcifications. IMPRESSION: 1. No skull fracture or intracranial hemorrhage. 2. No cervical spine fracture or traumatic subluxation. 3. Stable diffuse cerebral and cerebellar atrophy, chronic small vessel white matter ischemic changes in both cerebral hemispheres and old bilateral basal ganglia and pontine lacunar infarcts. 4. Stable cervical spine degenerative changes. 5. No significant change in a multinodular thyroid goiter with the largest nodule on the left, measuring 1.4 cm in maximum diameter. Consider further evaluation with thyroid ultrasound. If patient is clinically hyperthyroid, consider nuclear medicine thyroid uptake and scan. 6. Bilateral carotid artery atheromatous calcifications. Electronically Signed   By: Beckie Salts M.D.   On: 02/21/2018 08:52    Procedures Procedures (including critical care time)  Medications Ordered in ED Medications - No data to display   Initial  Impression / Assessment and Plan / ED Course  I have reviewed the triage vital signs and the nursing notes.  Pertinent labs & imaging results that were available during my care of the patient were reviewed by me and considered in my medical decision making (see chart for details).  Clinical Course as of Feb 22 1543  Sat Feb 21, 2018  6023 84 year old female with dementia and nonverbal here after probable fall.  She has no objective signs of trauma but due to her age and inability to communicate getting a head CT and a C-spine CT.  She is got full range of motion of all extremities without any deformity. per staff she is at her baseline so feel any indication to do any lab work.   [MB]  [id number] Patient CT  imaging did not show any acute findings.  Will return her to the nursing home to have them continue to watch for any other concerning findings.   [MB]    Clinical Course User Index [MB] Terrilee Files, MD     Final Clinical Impressions(s) / ED Diagnoses   Final diagnoses:  Fall, initial encounter  Dementia without behavioral disturbance, unspecified dementia type    ED Discharge Orders    None       Terrilee Files, MD 02/21/18 1545

## 2018-04-21 ENCOUNTER — Emergency Department (HOSPITAL_COMMUNITY)
Admission: EM | Admit: 2018-04-21 | Discharge: 2018-04-21 | Disposition: A | Discharge status: Home / Self Care | Payer: Medicare Other | Attending: Emergency Medicine | Admitting: Emergency Medicine

## 2018-04-21 ENCOUNTER — Encounter (HOSPITAL_COMMUNITY): Payer: Self-pay

## 2018-04-21 ENCOUNTER — Emergency Department (HOSPITAL_COMMUNITY): Payer: Medicare Other

## 2018-04-21 DIAGNOSIS — F0390 Unspecified dementia without behavioral disturbance: Secondary | ICD-10-CM | POA: Insufficient documentation

## 2018-04-21 DIAGNOSIS — Y92129 Unspecified place in nursing home as the place of occurrence of the external cause: Secondary | ICD-10-CM | POA: Diagnosis not present

## 2018-04-21 DIAGNOSIS — Y939 Activity, unspecified: Secondary | ICD-10-CM | POA: Insufficient documentation

## 2018-04-21 DIAGNOSIS — Z8673 Personal history of transient ischemic attack (TIA), and cerebral infarction without residual deficits: Secondary | ICD-10-CM | POA: Insufficient documentation

## 2018-04-21 DIAGNOSIS — Z87891 Personal history of nicotine dependence: Secondary | ICD-10-CM | POA: Insufficient documentation

## 2018-04-21 DIAGNOSIS — I10 Essential (primary) hypertension: Secondary | ICD-10-CM | POA: Diagnosis not present

## 2018-04-21 DIAGNOSIS — Y998 Other external cause status: Secondary | ICD-10-CM | POA: Insufficient documentation

## 2018-04-21 DIAGNOSIS — W07XXXA Fall from chair, initial encounter: Secondary | ICD-10-CM | POA: Insufficient documentation

## 2018-04-21 DIAGNOSIS — E7800 Pure hypercholesterolemia, unspecified: Secondary | ICD-10-CM | POA: Insufficient documentation

## 2018-04-21 DIAGNOSIS — W19XXXA Unspecified fall, initial encounter: Secondary | ICD-10-CM

## 2018-04-21 DIAGNOSIS — J449 Chronic obstructive pulmonary disease, unspecified: Secondary | ICD-10-CM | POA: Insufficient documentation

## 2018-04-21 DIAGNOSIS — Z79899 Other long term (current) drug therapy: Secondary | ICD-10-CM | POA: Diagnosis not present

## 2018-04-21 DIAGNOSIS — S0990XA Unspecified injury of head, initial encounter: Secondary | ICD-10-CM | POA: Insufficient documentation

## 2018-04-21 NOTE — ED Notes (Signed)
Patient changed, cleaned, and new brief put on.

## 2018-04-21 NOTE — ED Provider Notes (Addendum)
[name]-EMERGENCY DEPT Provider Note   CSN: [id number] Arrival date & time: 04/21/18  1458     History   Chief Complaint Chief Complaint  Patient presents with  . Fall    HPI Brittany Powers is a 84 y.o. female.  84 year old female with past medical history including dementia, CVA, COPD, hypertension, hyperlipidemia who presents with fall.  She was at her nursing facility today and was sitting in a chair when she somehow fell onto the floor.  The fall was unwitnessed but staff heard her fall and she was awake when they found her.  Patient was still on the floor when EMS arrived.  She is nonverbal at baseline.  Family member states that she is currently at her neurologic baseline.  She has had no vomiting or lethargy.  No anticoagulant use.  She was found to have a small hematoma above her left eye.  The history is provided by a relative.  Fall   LEVEL 5 CAVEAT DUE TO DEMENTIA  Past Medical History:  Diagnosis Date  . COPD (chronic obstructive pulmonary disease) (HCC)   . CVA (cerebral infarction)   . Dementia   . High cholesterol   . Hypertension   . Incontinent of urine     Patient Active Problem List   Diagnosis Date Noted  . Sepsis (HCC) 04/10/2016  . UTI (lower urinary tract infection) 04/10/2016  . Leukocytosis 04/10/2016  . Fever 04/10/2016  . Epidural hematoma (HCC) 02/08/2015  . Fall at nursing home 02/08/2015  . HTN (hypertension) 02/08/2015  . Aphasia as late effect of cerebrovascular accident 02/08/2015  . Dementia 02/08/2015    Past Surgical History:  Procedure Laterality Date  . NO PAST SURGERIES       OB History   None      Home Medications    Prior to Admission medications   Medication Sig Start Date End Date Taking? Authorizing Provider  acetaminophen (TYLENOL) 500 MG tablet Take 500 mg by mouth every 4 (four) hours as needed for mild pain, moderate pain, fever or headache.    Yes [provider]    calcium carbonate (OS-CAL - DOSED IN MG OF ELEMENTAL CALCIUM) 1250 (500 CA) MG tablet Take 1 tablet (500 mg of elemental calcium total) by mouth daily with breakfast. 02/09/15  Yes Rabbani, Marjan, MD  fish oil-omega-3 fatty acids 1000 MG capsule Take 1 g by mouth 2 (two) times daily.   Yes [provider]  guaifenesin (ROBITUSSIN) 100 MG/5ML syrup Take 200 mg by mouth every 6 (six) hours as needed for cough.    Yes [provider]  hydrochlorothiazide (HYDRODIURIL) 12.5 MG tablet Take 12.5 mg by mouth daily with breakfast.   Yes [provider]  levalbuterol (XOPENEX) 0.63 MG/3ML nebulizer solution Take 3 mLs (0.63 mg total) by nebulization every 6 (six) hours as needed for wheezing or shortness of breath. 04/13/16  Yes Regalado, Belkys A, MD  magnesium hydroxide (MILK OF MAGNESIA) 400 MG/5ML suspension Take 30 mLs by mouth at bedtime as needed for mild constipation.    Yes [provider]  neomycin-bacitracin-polymyxin (NEOSPORIN) OINT Apply 1 application topically daily as needed for wound care.   Yes [provider]  Skin Protectants, Misc. (BAZA PROTECT EX) Apply 1 application topically as needed (with every incontinent episode). Apply to groin/buttocks   Yes [provider]  verapamil (CALAN) 80 MG tablet Take 80 mg by mouth 3 (three) times daily.    Yes [provider]  Vitamin D, Ergocalciferol, (DRISDOL) 50000 UNITS CAPS capsule Take 50,000 Units by mouth every 30 (thirty) days.    Yes [provider]  alum & mag hydroxide-simeth (MINTOX) 200-200-20 MG/5ML suspension Take 30 mLs by mouth as needed for indigestion or heartburn.    [provider]  cephALEXin (KEFLEX) 500 MG capsule Take 1 capsule (500 mg total) by mouth 4 (four) times daily. Patient not taking: Reported on 04/21/2018 04/13/16   Alba Cory, MD    Family History History reviewed. No pertinent family history.  Social History Social History    Tobacco Use  . Smoking status: Former Smoker    Types: Cigarettes    Last attempt to quit: 12/26/2014    Years since quitting: 3.3  . Smokeless tobacco: Never Used  . Tobacco comment: " smoked most of her life "  Substance Use Topics  . Alcohol use: No  . Drug use: No     Allergies   Bee venom   Review of Systems Review of Systems  Unable to perform ROS: Dementia     Physical Exam Updated Vital Signs BP (!) 163/75 (BP Location: Right Arm)   Pulse 71   Temp (!) 97.4 F (36.3 C) (Oral)   Resp 16   SpO2 98%   Physical Exam  Constitutional: She appears well-developed and well-nourished. No distress.  HENT:  Head: Normocephalic.  Moist mucous membranes Small abrasion and hematoma left forehead  Eyes: Pupils are equal, round, and reactive to light. Conjunctivae are normal.  Neck:  In c-collar  Cardiovascular: Normal rate, regular rhythm and normal heart sounds.  No murmur heard. Pulmonary/Chest: Effort normal and breath sounds normal. She exhibits no tenderness.  Abdominal: Soft. Bowel sounds are normal. She exhibits no distension. There is no tenderness.  Musculoskeletal: Normal range of motion. She exhibits no edema, tenderness or deformity.  No focal extremity tenderness, normal ROM hips and knees  Neurological: She is alert.  Non-verbal   Skin: Skin is warm and dry.  Nursing note and vitals reviewed.    ED Treatments / Results  Labs (all labs ordered are listed, but only abnormal results are displayed) Labs Reviewed - No data to display  EKG None  Radiology Ct Head Wo Contrast  Result Date: 04/21/2018 CLINICAL DATA:  Fall.  History of stroke. EXAM: CT HEAD WITHOUT CONTRAST CT CERVICAL SPINE WITHOUT CONTRAST TECHNIQUE: Multidetector CT imaging of the head and cervical spine was performed following the standard protocol without intravenous contrast. Multiplanar CT image reconstructions of the cervical spine were also generated. COMPARISON:  CT head and  cervical spine 02/21/2018 FINDINGS: CT HEAD FINDINGS Brain: There is no mass, hemorrhage or extra-axial collection. There is generalized atrophy without lobar predilection. There are old basal ganglia small vessel infarct. There is hypoattenuation of the periventricular white matter, most commonly indicating chronic ischemic microangiopathy. Vascular: Atherosclerotic calcification of the vertebral and internal carotid arteries at the skull base. No abnormal hyperdensity of the major intracranial arteries or dural venous sinuses. Skull: Small left frontal scalp hematoma without skull fracture. Sinuses/Orbits: No fluid levels or advanced mucosal thickening of the visualized paranasal sinuses. No mastoid or middle ear effusion. The orbits are normal. CT CERVICAL SPINE FINDINGS Alignment: There is grade 1 anterolisthesis at C4-5 and C5-6, likely due to facet hypertrophy. The lateral masses of C1 and C2 and the occipital condyles are in normal alignment. Skull base and vertebrae: No acute fracture. Soft tissues and spinal canal: Heterogeneous thyroid gland. No prevertebral effusion.  No paraspinal hematoma. Disc levels: Multilevel facet hypertrophy without spinal canal stenosis or high-grade neural foraminal stenosis. Upper chest: No pneumothorax, pulmonary nodule or pleural effusion. Other: Normal visualized paraspinal cervical soft tissues. IMPRESSION: 1. No acute intracranial abnormality. 2. No fracture or static subluxation the cervical spine. 3. Small left frontal scalp hematoma without skull fracture. 4. Chronic ischemic microangiopathy and generalized brain volume loss. 5. Multilevel cervical facet arthrosis. Electronically Signed   By: Deatra RobinsonKevin  Herman M.D.   On: 04/21/2018 16:36   Ct Cervical Spine Wo Contrast  Result Date: 04/21/2018 CLINICAL DATA:  Fall.  History of stroke. EXAM: CT HEAD WITHOUT CONTRAST CT CERVICAL SPINE WITHOUT CONTRAST TECHNIQUE: Multidetector CT imaging of the head and cervical spine was  performed following the standard protocol without intravenous contrast. Multiplanar CT image reconstructions of the cervical spine were also generated. COMPARISON:  CT head and cervical spine 02/21/2018 FINDINGS: CT HEAD FINDINGS Brain: There is no mass, hemorrhage or extra-axial collection. There is generalized atrophy without lobar predilection. There are old basal ganglia small vessel infarct. There is hypoattenuation of the periventricular white matter, most commonly indicating chronic ischemic microangiopathy. Vascular: Atherosclerotic calcification of the vertebral and internal carotid arteries at the skull base. No abnormal hyperdensity of the major intracranial arteries or dural venous sinuses. Skull: Small left frontal scalp hematoma without skull fracture. Sinuses/Orbits: No fluid levels or advanced mucosal thickening of the visualized paranasal sinuses. No mastoid or middle ear effusion. The orbits are normal. CT CERVICAL SPINE FINDINGS Alignment: There is grade 1 anterolisthesis at C4-5 and C5-6, likely due to facet hypertrophy. The lateral masses of C1 and C2 and the occipital condyles are in normal alignment. Skull base and vertebrae: No acute fracture. Soft tissues and spinal canal: Heterogeneous thyroid gland. No prevertebral effusion. No paraspinal hematoma. Disc levels: Multilevel facet hypertrophy without spinal canal stenosis or high-grade neural foraminal stenosis. Upper chest: No pneumothorax, pulmonary nodule or pleural effusion. Other: Normal visualized paraspinal cervical soft tissues. IMPRESSION: 1. No acute intracranial abnormality. 2. No fracture or static subluxation the cervical spine. 3. Small left frontal scalp hematoma without skull fracture. 4. Chronic ischemic microangiopathy and generalized brain volume loss. 5. Multilevel cervical facet arthrosis. Electronically Signed   By: Deatra RobinsonKevin  Herman M.D.   On: 04/21/2018 16:36    Procedures Procedures (including critical care  time)  Medications Ordered in ED Medications - No data to display   Initial Impression / Assessment and Plan / ED Course  I have reviewed the triage vital signs and the nursing notes.  Pertinent  imaging results that were available during my care of the patient were reviewed by me and considered in my medical decision making (see chart for details).     She was alert and comfortable on exam with reassuring vital signs.  No focal areas of tenderness.  Small hematoma forehead.  CT of head and C-spine negative for acute injury.  I discussed with family member return precautions including any lethargy, sudden changes in behavior, repetitive vomiting, or new focal areas of pain.  Patient discharged back to nursing facility.  Final Clinical Impressions(s) / ED Diagnoses   Final diagnoses:  None    ED Discharge Orders    None       Lashica Hannay, Ambrose Finlandachel Morgan, MD 04/21/18 1710    Chaquana Nichols, Ambrose Finlandachel Morgan, MD 04/21/18 1711

## 2018-04-21 NOTE — ED Notes (Signed)
Spoke to [employer]. Couple ahead of patient.

## 2018-04-21 NOTE — ED Notes (Signed)
Patient non-verbal. Not answering questions. Sister in-law at bedside not able to answer questions about patient. Waiting for patients sister.

## 2018-04-21 NOTE — Discharge Instructions (Addendum)
RETURN TO ER IF ANY VOMITING, MENTAL STATUS CHANGES, LETHARGY, OR NEW AREAS OF PAIN.

## 2018-04-21 NOTE — ED Notes (Signed)
Patient transported to CT 

## 2018-04-21 NOTE — ED Triage Notes (Signed)
Patient arrived via [facility] from [facility]. Patient was sitting in the chair and slipped out into the floor. Patient was still on the floor when ems arrived. Non verbal, dementia. No blood thinners. Hematoma above left eye. Patient in neck brace. PERRLA.

## 2018-04-21 NOTE — ED Notes (Signed)
Bed: WA09 Expected date:  Expected time:  Means of arrival:  Comments: EMS-fall 

## 2018-06-02 ENCOUNTER — Other Ambulatory Visit: Payer: Self-pay

## 2018-06-02 ENCOUNTER — Encounter (HOSPITAL_COMMUNITY): Payer: Self-pay | Admitting: Emergency Medicine

## 2018-06-02 ENCOUNTER — Emergency Department (HOSPITAL_COMMUNITY): Payer: Medicare Other

## 2018-06-02 ENCOUNTER — Emergency Department (HOSPITAL_COMMUNITY)
Admission: EM | Admit: 2018-06-02 | Discharge: 2018-06-02 | Disposition: A | Discharge status: Skilled Nursing Facility | Payer: Medicare Other | Attending: Emergency Medicine | Admitting: Emergency Medicine

## 2018-06-02 DIAGNOSIS — Z79899 Other long term (current) drug therapy: Secondary | ICD-10-CM | POA: Insufficient documentation

## 2018-06-02 DIAGNOSIS — F0390 Unspecified dementia without behavioral disturbance: Secondary | ICD-10-CM | POA: Insufficient documentation

## 2018-06-02 DIAGNOSIS — W06XXXA Fall from bed, initial encounter: Secondary | ICD-10-CM | POA: Diagnosis not present

## 2018-06-02 DIAGNOSIS — Z87891 Personal history of nicotine dependence: Secondary | ICD-10-CM | POA: Diagnosis not present

## 2018-06-02 DIAGNOSIS — S0990XA Unspecified injury of head, initial encounter: Secondary | ICD-10-CM | POA: Diagnosis present

## 2018-06-02 DIAGNOSIS — S01112A Laceration without foreign body of left eyelid and periocular area, initial encounter: Secondary | ICD-10-CM | POA: Insufficient documentation

## 2018-06-02 DIAGNOSIS — Y999 Unspecified external cause status: Secondary | ICD-10-CM | POA: Diagnosis not present

## 2018-06-02 DIAGNOSIS — I10 Essential (primary) hypertension: Secondary | ICD-10-CM | POA: Insufficient documentation

## 2018-06-02 DIAGNOSIS — Y939 Activity, unspecified: Secondary | ICD-10-CM | POA: Insufficient documentation

## 2018-06-02 DIAGNOSIS — J449 Chronic obstructive pulmonary disease, unspecified: Secondary | ICD-10-CM | POA: Insufficient documentation

## 2018-06-02 DIAGNOSIS — S02401A Maxillary fracture, unspecified, initial encounter for closed fracture: Secondary | ICD-10-CM | POA: Insufficient documentation

## 2018-06-02 DIAGNOSIS — Y92129 Unspecified place in nursing home as the place of occurrence of the external cause: Secondary | ICD-10-CM | POA: Diagnosis not present

## 2018-06-02 MED ORDER — LIDOCAINE HCL (PF) 1 % IJ SOLN
5.0000 mL | Freq: Once | INTRAMUSCULAR | Status: AC
  Administered 2018-06-02: 5 mL via INTRADERMAL
  Filled 2018-06-02: qty 30

## 2018-06-02 MED ORDER — AMOXICILLIN-POT CLAVULANATE 875-125 MG PO TABS: 1.0000 | ORAL_TABLET | Freq: Two times a day (BID) | ORAL | 0 refills | Status: DC

## 2018-06-02 NOTE — ED Provider Notes (Signed)
[facility]-EMERGENCY DEPT Provider Note  CSN: [id number] Arrival date & time: 06/02/18 0208  Chief Complaint(s) Fall  HPI Brittany Powers is a 84 y.o. female with a history of dementia who lives in a skilled nursing facility presents for an unwitnessed fall.  Patient apparently rolled out of bed resulting in facial trauma.  Noted to have a laceration just below the left eyebrow.  Unknown loss of consciousness.  Patient is nonverbal.  Girtha Rm of history, ROS, and physical exam limited due to patient's condition (dementia). Additional information was obtained from EMS and sister at bedside.   Level V Caveat.  Tetanus up-to-date per sister.  HPI  Past Medical History Past Medical History:  Diagnosis Date  . COPD (chronic obstructive pulmonary disease) (HCC)   . CVA (cerebral infarction)   . Dementia (HCC)   . High cholesterol   . Hypertension   . Incontinent of urine    Patient Active Problem List   Diagnosis Date Noted  . Sepsis (HCC) 04/10/2016  . UTI (lower urinary tract infection) 04/10/2016  . Leukocytosis 04/10/2016  . Fever 04/10/2016  . Epidural hematoma (HCC) 02/08/2015  . Fall at nursing home 02/08/2015  . HTN (hypertension) 02/08/2015  . Aphasia as late effect of cerebrovascular accident 02/08/2015  . Dementia (HCC) 02/08/2015   Home Medication(s) Prior to Admission medications   Medication Sig Start Date End Date Taking? Authorizing Provider  acetaminophen (TYLENOL) 500 MG tablet Take 500 mg by mouth every 4 (four) hours as needed for mild pain, moderate pain, fever or headache.     [provider]  alum & mag hydroxide-simeth (MINTOX) 200-200-20 MG/5ML suspension Take 30 mLs by mouth as needed for indigestion or heartburn.    [provider]  calcium carbonate (OS-CAL - DOSED IN MG OF ELEMENTAL CALCIUM) 1250 (500 CA) MG tablet Take 1 tablet (500 mg of elemental calcium total) by mouth daily with breakfast. 02/09/15    Otis Brace, MD  cephALEXin (KEFLEX) 500 MG capsule Take 1 capsule (500 mg total) by mouth 4 (four) times daily. Patient not taking: Reported on 04/21/2018 04/13/16   Regalado, Jon Billings A, MD  fish oil-omega-3 fatty acids 1000 MG capsule Take 1 g by mouth 2 (two) times daily.    [provider]  guaifenesin (ROBITUSSIN) 100 MG/5ML syrup Take 200 mg by mouth every 6 (six) hours as needed for cough.     [provider]  hydrochlorothiazide (HYDRODIURIL) 12.5 MG tablet Take 12.5 mg by mouth daily with breakfast.    [provider]  levalbuterol (XOPENEX) 0.63 MG/3ML nebulizer solution Take 3 mLs (0.63 mg total) by nebulization every 6 (six) hours as needed for wheezing or shortness of breath. 04/13/16   Regalado, Belkys A, MD  magnesium hydroxide (MILK OF MAGNESIA) 400 MG/5ML suspension Take 30 mLs by mouth at bedtime as needed for mild constipation.     [provider]  neomycin-bacitracin-polymyxin (NEOSPORIN) OINT Apply 1 application topically daily as needed for wound care.    [provider]  Skin Protectants, Misc. (BAZA PROTECT EX) Apply 1 application topically as needed (with every incontinent episode). Apply to groin/buttocks    [provider]  verapamil (CALAN) 80 MG tablet Take 80 mg by mouth 3 (three) times daily.     [provider]  Vitamin D, Ergocalciferol, (DRISDOL) 50000 UNITS CAPS capsule Take 50,000 Units by mouth every 30 (thirty) days.     [provider]  Past Surgical History Past Surgical History:  Procedure Laterality Date  . NO PAST SURGERIES     Family History No family history on file.  Social History Social History   Tobacco Use  . Smoking status: Former Smoker    Types: Cigarettes    Last attempt to quit: 12/26/2014    Years since quitting: 3.4  . Smokeless  tobacco: Never Used  . Tobacco comment: " smoked most of her life "  Substance Use Topics  . Alcohol use: No  . Drug use: No   Allergies Bee venom  Review of Systems Review of Systems  Unable to perform ROS: Dementia    Physical Exam Vital Signs  I have reviewed the triage vital signs BP (!) 157/94 Comment: Simultaneous filing. User may not have seen previous data.  Pulse 71   Temp 97.9 F (36.6 C) (Oral)   Resp 20   SpO2 99%   Physical Exam  Constitutional: She is oriented to person, place, and time. She appears well-developed and well-nourished. No distress.  HENT:  Head: Normocephalic. Head is with contusion and with laceration.    Right Ear: External ear normal.  Left Ear: External ear normal.  Nose: Nose normal.  Eyes: Pupils are equal, round, and reactive to light. Conjunctivae and EOM are normal. Right eye exhibits no discharge. Left eye exhibits no discharge. No scleral icterus.  Neck: Normal range of motion. Neck supple.  Cardiovascular: Normal rate, regular rhythm and normal heart sounds. Exam reveals no gallop and no friction rub.  No murmur heard. Pulses:      Radial pulses are 2+ on the right side, and 2+ on the left side.       Dorsalis pedis pulses are 2+ on the right side, and 2+ on the left side.  Pulmonary/Chest: Effort normal and breath sounds normal. No stridor. No respiratory distress. She has no wheezes.  Abdominal: Soft. She exhibits no distension. There is no tenderness.  Musculoskeletal: She exhibits no edema or tenderness.       Cervical back: She exhibits no bony tenderness.       Thoracic back: She exhibits no bony tenderness.       Lumbar back: She exhibits no bony tenderness.  Clavicles stable. Chest stable to AP/Lat compression. Pelvis stable to Lat compression. No obvious extremity deformity. No chest or abdominal wall contusion.  Neurological: She is alert and oriented to person, place, and time.  Moving all extremities  Skin: Skin  is warm and dry. No rash noted. She is not diaphoretic. No erythema.  Psychiatric: She has a normal mood and affect.    ED Results and Treatments Labs (all labs ordered are listed, but only abnormal results are displayed) Labs Reviewed - No data to display                                                                                                                       EKG  EKG Interpretation  Date/Time:  Ventricular Rate:    PR Interval:    QRS Duration:   QT Interval:    QTC Calculation:   R Axis:     Text Interpretation:        Radiology Ct Head Wo Contrast  Result Date: 06/02/2018 CLINICAL DATA:  Fall from bed.  Laceration above left. EXAM: CT HEAD WITHOUT CONTRAST CT CERVICAL SPINE WITHOUT CONTRAST TECHNIQUE: Multidetector CT imaging of the head and cervical spine was performed following the standard protocol without intravenous contrast. Multiplanar CT image reconstructions of the cervical spine were also generated. COMPARISON:  CT head and cervical spine 04/21/2018 FINDINGS: CT HEAD FINDINGS Brain: There is no mass, hemorrhage or extra-axial collection. There is generalized atrophy without lobar predilection. There are old bilateral gangliocapsular lacunar infarcts. There is hypoattenuation of the periventricular white matter, most commonly indicating chronic ischemic microangiopathy. Vascular: Atherosclerotic calcification of the internal carotid arteries at the skull base. No abnormal hyperdensity of the major intracranial arteries or dural venous sinuses. Skull: There is a mildly depressed fracture of the anterior wall of the right maxillary sinus with associated hemosinus. Sinuses/Orbits: There is blood within the right maxillary sinus. Mastoids are clear. The orbits are normal. CT CERVICAL SPINE FINDINGS Alignment: Grade 1 anterolisthesis at C5-6. Facets are normally aligned. Skull base and vertebrae: No acute fracture. Soft tissues and spinal canal: No prevertebral fluid or  swelling. No visible canal hematoma. Disc levels: There is multilevel facet hypertrophy, predominantly at the C3-C6 levels. Upper chest: No pneumothorax, pulmonary nodule or pleural effusion. Other: Normal visualized paraspinal cervical soft tissues. IMPRESSION: 1. No acute intracranial abnormality. 2. Mildly depressed fracture of the anterior wall of the right maxillary sinus with hemosinus. 3. No acute fracture of the cervical spine. 4. Multilevel cervical facet arthrosis with associated grade 1 C5-6 anterolisthesis. Electronically Signed   By: Deatra Robinson M.D.   On: 06/02/2018 03:27   Ct Cervical Spine Wo Contrast  Result Date: 06/02/2018 CLINICAL DATA:  Fall from bed.  Laceration above left. EXAM: CT HEAD WITHOUT CONTRAST CT CERVICAL SPINE WITHOUT CONTRAST TECHNIQUE: Multidetector CT imaging of the head and cervical spine was performed following the standard protocol without intravenous contrast. Multiplanar CT image reconstructions of the cervical spine were also generated. COMPARISON:  CT head and cervical spine 04/21/2018 FINDINGS: CT HEAD FINDINGS Brain: There is no mass, hemorrhage or extra-axial collection. There is generalized atrophy without lobar predilection. There are old bilateral gangliocapsular lacunar infarcts. There is hypoattenuation of the periventricular white matter, most commonly indicating chronic ischemic microangiopathy. Vascular: Atherosclerotic calcification of the internal carotid arteries at the skull base. No abnormal hyperdensity of the major intracranial arteries or dural venous sinuses. Skull: There is a mildly depressed fracture of the anterior wall of the right maxillary sinus with associated hemosinus. Sinuses/Orbits: There is blood within the right maxillary sinus. Mastoids are clear. The orbits are normal. CT CERVICAL SPINE FINDINGS Alignment: Grade 1 anterolisthesis at C5-6. Facets are normally aligned. Skull base and vertebrae: No acute fracture. Soft tissues and  spinal canal: No prevertebral fluid or swelling. No visible canal hematoma. Disc levels: There is multilevel facet hypertrophy, predominantly at the C3-C6 levels. Upper chest: No pneumothorax, pulmonary nodule or pleural effusion. Other: Normal visualized paraspinal cervical soft tissues. IMPRESSION: 1. No acute intracranial abnormality. 2. Mildly depressed fracture of the anterior wall of the right maxillary sinus with hemosinus. 3. No acute fracture of the cervical spine. 4. Multilevel cervical facet arthrosis with associated grade 1 C5-6 anterolisthesis. Electronically Signed  By: Deatra Robinson M.D.   On: 06/02/2018 03:27   Ct Maxillofacial Wo Contrast  Result Date: 06/02/2018 CLINICAL DATA:  Fall from bed EXAM: CT MAXILLOFACIAL WITHOUT CONTRAST TECHNIQUE: Multidetector CT imaging of the maxillofacial structures was performed. Multiplanar CT image reconstructions were also generated. COMPARISON:  Head CT same day FINDINGS: Osseous: --Complex facial fracture types: No LeFort, zygomaticomaxillary complex or nasoorbitoethmoidal fracture. --Simple fracture types: There is a fracture of the anterior wall of the right maxillary sinus with approximately 5 mm of depression. No other facial fracture. --Mandible, hard palate and teeth: No acute abnormality. Orbits: The globes are intact. Normal appearance of the intra- and extraconal fat. Symmetric extraocular muscles. Sinuses: There is blood within the right maxillary sinus. The other paranasal sinuses are unremarkable. Soft tissues: Mild left facial soft tissue swelling. Limited intracranial: Normal. IMPRESSION: Mildly depressed fracture of the anterior right maxillary sinus wall with blood in the maxillary sinus. No other facial fracture. Electronically Signed   By: Deatra Robinson M.D.   On: 06/02/2018 04:30   Pertinent labs & imaging results that were available during my care of the patient were reviewed by me and considered in my medical decision making (see  chart for details).  Medications Ordered in ED Medications  lidocaine (PF) (XYLOCAINE) 1 % injection 5 mL (5 mLs Intradermal Given by Other 06/02/18 0448)                                                                                                                                    Procedures .Marland KitchenLaceration Repair Date/Time: 06/02/2018 5:51 AM Performed by: Nira Conn, MD Authorized by: Nira Conn, MD   Consent:    Consent obtained:  Verbal   Consent given by:  Healthcare agent   Risks discussed:  Poor cosmetic result and poor wound healing   Alternatives discussed:  Delayed treatment Anesthesia (see MAR for exact dosages):    Anesthesia method:  Local infiltration   Local anesthetic:  Lidocaine 1% w/o epi Laceration details:    Location:  Face   Face location:  L upper eyelid   Extent:  Superficial   Length (cm):  1.5   Depth (mm):  1 Repair type:    Repair type:  Simple Pre-procedure details:    Preparation:  Patient was prepped and draped in usual sterile fashion and imaging obtained to evaluate for foreign bodies Exploration:    Hemostasis achieved with:  Direct pressure   Wound extent: no foreign bodies/material noted, no muscle damage noted and no vascular damage noted     Contaminated: no   Treatment:    Area cleansed with:  Betadine   Amount of cleaning:  Standard   Irrigation solution:  Sterile saline   Irrigation volume:  250cc   Irrigation method:  Syringe   Visualized foreign bodies/material removed: no   Skin repair:    Repair method:  Sutures   Suture  size:  5-0   Suture material:  Fast-absorbing gut   Suture technique:  Simple interrupted   Number of sutures:  2 Approximation:    Approximation:  Close Post-procedure details:    Patient tolerance of procedure:  Tolerated well, no immediate complications    (including critical care time)  Medical Decision Making / ED Course I have reviewed the nursing notes for this  encounter and the patient's prior records (if available in EHR or on provided paperwork).    Fall from bed.  Noted to have facial trauma including laceration over the left eye and right maxillary swelling with tenderness to palpation.  CT head without ICH but notable for anterior wall maxillary sinus fracture with edema sinus.  CT cervical spine negative.  No other injuries noted on exam requiring additional imaging.  Laceration irrigated and closed as above.  Will give ppx Abx to prevent sinus infection.   The patient appears reasonably screened and/or stabilized for discharge and I doubt any other medical condition or other [facility] requiring further screening, evaluation, or treatment in the ED at this time prior to discharge.  The patient is safe for discharge with strict return precautions.   Final Clinical Impression(s) / ED Diagnoses Final diagnoses:  None    Disposition: Discharge  Condition: Good  I have discussed the results, Dx and Tx plan with the patient's sister who expressed understanding and agree(s) with the plan. Discharge instructions discussed at great length. The patient's sister was given strict return precautions who verbalized understanding of the instructions. No further questions at time of discharge.    ED Discharge Orders    None       Follow Up: Donn Pierini, MD [address] [place] [address] [phone]  Schedule an appointment as soon as possible for a visit  As needed  Newman Pies, MD [address] [place] [address] [phone]  Schedule an appointment as soon as possible for a visit  For close follow up to assess for right maxillary sinus fracture     This chart was dictated using voice recognition software.  Despite best efforts to proofread,  errors can occur which can change the documentation meaning.   Nira Conn, MD 06/02/18 [phone]

## 2018-06-02 NOTE — Discharge Instructions (Addendum)
The sutures will dissolve on their own in several weeks. There is no need to have them removed.  Please follow up with ENT for the maxillary sinus fracture.

## 2018-06-02 NOTE — ED Notes (Signed)
Patient returned from CT

## 2018-06-02 NOTE — ED Notes (Signed)
Patient transported to CT 

## 2018-06-02 NOTE — ED Triage Notes (Addendum)
Pt arriving via [facility] from [facility] after falling out of her bed. Pt has laceration over left eye.

## 2018-06-02 NOTE — ED Notes (Signed)
[facility] contacted for discharge transport back to [facility] 

## 2018-06-02 NOTE — ED Notes (Signed)
Wound care done

## 2018-06-02 NOTE — ED Notes (Signed)
Pt non verbal

## 2018-06-02 NOTE — ED Notes (Signed)
Bed: ZO10 Expected date:  Expected time:  Means of arrival:  Comments: 84 yo F/ Fall

## 2018-07-20 ENCOUNTER — Inpatient Hospital Stay (HOSPITAL_COMMUNITY): Payer: Medicare Other

## 2018-07-20 ENCOUNTER — Other Ambulatory Visit: Payer: Self-pay

## 2018-07-20 ENCOUNTER — Inpatient Hospital Stay (HOSPITAL_COMMUNITY)
Admission: EM | Admit: 2018-07-20 | Discharge: 2018-07-24 | DRG: 871 | Disposition: A | Discharge status: Hospice | Payer: Medicare Other | Attending: Internal Medicine | Admitting: Internal Medicine

## 2018-07-20 ENCOUNTER — Encounter (HOSPITAL_COMMUNITY): Payer: Self-pay | Admitting: Emergency Medicine

## 2018-07-20 ENCOUNTER — Emergency Department (HOSPITAL_COMMUNITY): Payer: Medicare Other

## 2018-07-20 DIAGNOSIS — I69320 Aphasia following cerebral infarction: Secondary | ICD-10-CM | POA: Diagnosis not present

## 2018-07-20 DIAGNOSIS — R5381 Other malaise: Secondary | ICD-10-CM | POA: Diagnosis present

## 2018-07-20 DIAGNOSIS — R112 Nausea with vomiting, unspecified: Secondary | ICD-10-CM | POA: Diagnosis present

## 2018-07-20 DIAGNOSIS — E876 Hypokalemia: Secondary | ICD-10-CM | POA: Diagnosis present

## 2018-07-20 DIAGNOSIS — R627 Adult failure to thrive: Secondary | ICD-10-CM | POA: Diagnosis present

## 2018-07-20 DIAGNOSIS — E860 Dehydration: Secondary | ICD-10-CM | POA: Diagnosis present

## 2018-07-20 DIAGNOSIS — A419 Sepsis, unspecified organism: Secondary | ICD-10-CM | POA: Diagnosis not present

## 2018-07-20 DIAGNOSIS — Z515 Encounter for palliative care: Secondary | ICD-10-CM | POA: Diagnosis not present

## 2018-07-20 DIAGNOSIS — Z7189 Other specified counseling: Secondary | ICD-10-CM

## 2018-07-20 DIAGNOSIS — Z66 Do not resuscitate: Secondary | ICD-10-CM | POA: Diagnosis present

## 2018-07-20 DIAGNOSIS — Z87891 Personal history of nicotine dependence: Secondary | ICD-10-CM | POA: Diagnosis not present

## 2018-07-20 DIAGNOSIS — I10 Essential (primary) hypertension: Secondary | ICD-10-CM | POA: Diagnosis present

## 2018-07-20 DIAGNOSIS — D539 Nutritional anemia, unspecified: Secondary | ICD-10-CM | POA: Diagnosis present

## 2018-07-20 DIAGNOSIS — Z993 Dependence on wheelchair: Secondary | ICD-10-CM

## 2018-07-20 DIAGNOSIS — J690 Pneumonitis due to inhalation of food and vomit: Secondary | ICD-10-CM | POA: Diagnosis present

## 2018-07-20 DIAGNOSIS — R197 Diarrhea, unspecified: Secondary | ICD-10-CM | POA: Diagnosis present

## 2018-07-20 DIAGNOSIS — F0390 Unspecified dementia without behavioral disturbance: Secondary | ICD-10-CM | POA: Diagnosis present

## 2018-07-20 DIAGNOSIS — I4891 Unspecified atrial fibrillation: Secondary | ICD-10-CM | POA: Diagnosis present

## 2018-07-20 DIAGNOSIS — R1310 Dysphagia, unspecified: Secondary | ICD-10-CM | POA: Diagnosis present

## 2018-07-20 DIAGNOSIS — J449 Chronic obstructive pulmonary disease, unspecified: Secondary | ICD-10-CM | POA: Diagnosis present

## 2018-07-20 DIAGNOSIS — Z79899 Other long term (current) drug therapy: Secondary | ICD-10-CM

## 2018-07-20 DIAGNOSIS — Z7401 Bed confinement status: Secondary | ICD-10-CM

## 2018-07-20 DIAGNOSIS — E7800 Pure hypercholesterolemia, unspecified: Secondary | ICD-10-CM | POA: Diagnosis present

## 2018-07-20 DIAGNOSIS — R296 Repeated falls: Secondary | ICD-10-CM | POA: Diagnosis present

## 2018-07-20 DIAGNOSIS — E785 Hyperlipidemia, unspecified: Secondary | ICD-10-CM | POA: Diagnosis present

## 2018-07-20 LAB — CBC WITH DIFFERENTIAL/PLATELET
Abs Immature Granulocytes: 0.34 10*3/uL — ABNORMAL HIGH (ref 0.00–0.07)
BASOS PCT: 0 %
Basophils Absolute: 0.1 10*3/uL (ref 0.0–0.1)
EOS ABS: 0 10*3/uL (ref 0.0–0.5)
Eosinophils Relative: 0 %
HCT: 45.3 % (ref 36.0–46.0)
Hemoglobin: 14.6 g/dL (ref 12.0–15.0)
Immature Granulocytes: 2 %
Lymphocytes Relative: 2 %
Lymphs Abs: 0.4 10*3/uL — ABNORMAL LOW (ref 0.7–4.0)
MCH: 32.4 pg (ref 26.0–34.0)
MCHC: 32.2 g/dL (ref 30.0–36.0)
MCV: 100.4 fL — AB (ref 80.0–100.0)
MONO ABS: 0.3 10*3/uL (ref 0.1–1.0)
MONOS PCT: 1 %
Neutro Abs: 21.4 10*3/uL — ABNORMAL HIGH (ref 1.7–7.7)
Neutrophils Relative %: 95 %
Platelets: 245 10*3/uL (ref 150–400)
RBC: 4.51 MIL/uL (ref 3.87–5.11)
RDW: 12.9 % (ref 11.5–15.5)
WBC: 22.5 10*3/uL — ABNORMAL HIGH (ref 4.0–10.5)
nRBC: 0 % (ref 0.0–0.2)

## 2018-07-20 LAB — RESPIRATORY PANEL BY PCR
ADENOVIRUS-RVPPCR: NOT DETECTED
Bordetella pertussis: NOT DETECTED
CHLAMYDOPHILA PNEUMONIAE-RVPPCR: NOT DETECTED
CORONAVIRUS NL63-RVPPCR: NOT DETECTED
Coronavirus 229E: NOT DETECTED
Coronavirus HKU1: NOT DETECTED
Coronavirus OC43: NOT DETECTED
INFLUENZA A-RVPPCR: NOT DETECTED
INFLUENZA B-RVPPCR: NOT DETECTED
Metapneumovirus: NOT DETECTED
Mycoplasma pneumoniae: NOT DETECTED
PARAINFLUENZA VIRUS 3-RVPPCR: NOT DETECTED
PARAINFLUENZA VIRUS 4-RVPPCR: NOT DETECTED
Parainfluenza Virus 1: NOT DETECTED
Parainfluenza Virus 2: NOT DETECTED
RESPIRATORY SYNCYTIAL VIRUS-RVPPCR: NOT DETECTED
RHINOVIRUS / ENTEROVIRUS - RVPPCR: NOT DETECTED

## 2018-07-20 LAB — URINALYSIS, ROUTINE W REFLEX MICROSCOPIC
Bilirubin Urine: NEGATIVE
GLUCOSE, UA: NEGATIVE mg/dL
Hgb urine dipstick: NEGATIVE
Ketones, ur: NEGATIVE mg/dL
LEUKOCYTES UA: NEGATIVE
Nitrite: NEGATIVE
Protein, ur: NEGATIVE mg/dL
SPECIFIC GRAVITY, URINE: 1.012 (ref 1.005–1.030)
pH: 5 (ref 5.0–8.0)

## 2018-07-20 LAB — BLOOD GAS, ARTERIAL
ACID-BASE DEFICIT: 1.2 mmol/L (ref 0.0–2.0)
Bicarbonate: 20.6 mmol/L (ref 20.0–28.0)
DRAWN BY: 331471
FIO2: 21
O2 SAT: 91.9 %
PATIENT TEMPERATURE: 100.3
PCO2 ART: 28.8 mmHg — AB (ref 32.0–48.0)
pH, Arterial: 7.473 — ABNORMAL HIGH (ref 7.350–7.450)
pO2, Arterial: 63.2 mmHg — ABNORMAL LOW (ref 83.0–108.0)

## 2018-07-20 LAB — COMPREHENSIVE METABOLIC PANEL
ALBUMIN: 3.9 g/dL (ref 3.5–5.0)
ALT: 16 U/L (ref 0–44)
AST: 24 U/L (ref 15–41)
Alkaline Phosphatase: 64 U/L (ref 38–126)
Anion gap: 10 (ref 5–15)
BUN: 18 mg/dL (ref 8–23)
CALCIUM: 9.3 mg/dL (ref 8.9–10.3)
CO2: 26 mmol/L (ref 22–32)
Chloride: 107 mmol/L (ref 98–111)
Creatinine, Ser: 0.78 mg/dL (ref 0.44–1.00)
GFR calc non Af Amer: >60 mL/min (ref >60)
Glucose, Bld: 149 mg/dL — ABNORMAL HIGH (ref 70–99)
Potassium: 3.7 mmol/L (ref 3.5–5.1)
SODIUM: 143 mmol/L (ref 135–145)
TOTAL PROTEIN: 7.5 g/dL (ref 6.5–8.1)
Total Bilirubin: 1 mg/dL (ref 0.3–1.2)

## 2018-07-20 LAB — TROPONIN I
Troponin I: 0.08 ng/mL (ref <0.03)
Troponin I: 0.18 ng/mL (ref <0.03)

## 2018-07-20 LAB — I-STAT CG4 LACTIC ACID, ED
Lactic Acid, Venous: 2.82 mmol/L (ref 0.5–1.9)
Lactic Acid, Venous: 2.69 mmol/L (ref 0.5–1.9)

## 2018-07-20 LAB — PROCALCITONIN: Procalcitonin: 7.76 ng/mL

## 2018-07-20 LAB — I-STAT TROPONIN, ED: Troponin i, poc: 0.01 ng/mL (ref 0.00–0.08)

## 2018-07-20 LAB — MRSA PCR SCREENING: MRSA by PCR: NEGATIVE

## 2018-07-20 MED ORDER — SODIUM CHLORIDE (PF) 0.9 % IJ SOLN
INTRAMUSCULAR | Status: AC
  Filled 2018-07-20: qty 50

## 2018-07-20 MED ORDER — METOPROLOL TARTRATE 5 MG/5ML IV SOLN
5.0000 mg | Freq: Three times a day (TID) | INTRAVENOUS | Status: DC
  Administered 2018-07-20 – 2018-07-24 (×12): 5 mg via INTRAVENOUS
  Filled 2018-07-20 (×13): qty 5

## 2018-07-20 MED ORDER — PIPERACILLIN-TAZOBACTAM 3.375 G IVPB
3.3750 g | Freq: Three times a day (TID) | INTRAVENOUS | Status: DC
  Administered 2018-07-20 – 2018-07-24 (×11): 3.375 g via INTRAVENOUS
  Filled 2018-07-20 (×13): qty 50

## 2018-07-20 MED ORDER — VANCOMYCIN HCL IN DEXTROSE 1-5 GM/200ML-% IV SOLN
1000.0000 mg | Freq: Once | INTRAVENOUS | Status: AC
  Administered 2018-07-20: 1000 mg via INTRAVENOUS
  Filled 2018-07-20: qty 200

## 2018-07-20 MED ORDER — ACETAMINOPHEN 650 MG RE SUPP
650.0000 mg | Freq: Once | RECTAL | Status: AC
  Administered 2018-07-20: 650 mg via RECTAL
  Filled 2018-07-20: qty 1

## 2018-07-20 MED ORDER — IOPAMIDOL (ISOVUE-300) INJECTION 61%
INTRAVENOUS | Status: AC
  Filled 2018-07-20: qty 100

## 2018-07-20 MED ORDER — IPRATROPIUM-ALBUTEROL 0.5-2.5 (3) MG/3ML IN SOLN
3.0000 mL | Freq: Four times a day (QID) | RESPIRATORY_TRACT | Status: DC
  Administered 2018-07-20: 3 mL via RESPIRATORY_TRACT

## 2018-07-20 MED ORDER — VANCOMYCIN HCL 500 MG IV SOLR: 500.0000 mg | INTRAVENOUS | Status: DC

## 2018-07-20 MED ORDER — ACETAMINOPHEN 120 MG RE SUPP
60.0000 mg | RECTAL | Status: DC | PRN
  Filled 2018-07-20: qty 1

## 2018-07-20 MED ORDER — METOPROLOL TARTRATE 5 MG/5ML IV SOLN
5.0000 mg | Freq: Once | INTRAVENOUS | Status: AC
  Administered 2018-07-20: 5 mg via INTRAVENOUS
  Filled 2018-07-20: qty 5

## 2018-07-20 MED ORDER — IPRATROPIUM-ALBUTEROL 0.5-2.5 (3) MG/3ML IN SOLN: 3.0000 mL | Freq: Three times a day (TID) | RESPIRATORY_TRACT | Status: DC

## 2018-07-20 MED ORDER — SODIUM CHLORIDE 0.9 % IV BOLUS (SEPSIS)
1000.0000 mL | Freq: Once | INTRAVENOUS | Status: AC
  Administered 2018-07-20: 1000 mL via INTRAVENOUS

## 2018-07-20 MED ORDER — SODIUM CHLORIDE 0.9 % IV SOLN
2.0000 g | Freq: Once | INTRAVENOUS | Status: AC
  Administered 2018-07-20: 2 g via INTRAVENOUS
  Filled 2018-07-20: qty 2

## 2018-07-20 MED ORDER — PIPERACILLIN-TAZOBACTAM 3.375 G IVPB 30 MIN
3.3750 g | Freq: Once | INTRAVENOUS | Status: AC
  Administered 2018-07-20: 3.375 g via INTRAVENOUS
  Filled 2018-07-20: qty 50

## 2018-07-20 MED ORDER — IOPAMIDOL (ISOVUE-300) INJECTION 61%
100.0000 mL | Freq: Once | INTRAVENOUS | Status: AC | PRN
  Administered 2018-07-20: 100 mL via INTRAVENOUS

## 2018-07-20 MED ORDER — METRONIDAZOLE IN NACL 5-0.79 MG/ML-% IV SOLN: 500.0000 mg | Freq: Three times a day (TID) | INTRAVENOUS | Status: DC

## 2018-07-20 MED ORDER — ENOXAPARIN SODIUM 40 MG/0.4ML ~~LOC~~ SOLN
40.0000 mg | SUBCUTANEOUS | Status: DC
  Administered 2018-07-20 – 2018-07-23 (×4): 40 mg via SUBCUTANEOUS
  Filled 2018-07-20 (×4): qty 0.4

## 2018-07-20 MED ORDER — IPRATROPIUM-ALBUTEROL 0.5-2.5 (3) MG/3ML IN SOLN
3.0000 mL | Freq: Four times a day (QID) | RESPIRATORY_TRACT | Status: DC
  Administered 2018-07-20 – 2018-07-22 (×8): 3 mL via RESPIRATORY_TRACT
  Filled 2018-07-20 (×8): qty 3

## 2018-07-20 MED ORDER — ONDANSETRON HCL 4 MG/2ML IJ SOLN: 4.0000 mg | Freq: Four times a day (QID) | INTRAMUSCULAR | Status: DC | PRN

## 2018-07-20 MED ORDER — DEXTROSE-NACL 5-0.45 % IV SOLN
INTRAVENOUS | Status: DC
  Administered 2018-07-20 – 2018-07-22 (×4): via INTRAVENOUS

## 2018-07-20 MED ORDER — IPRATROPIUM-ALBUTEROL 0.5-2.5 (3) MG/3ML IN SOLN: 3.0000 mL | RESPIRATORY_TRACT | Status: DC | PRN

## 2018-07-20 MED ORDER — SODIUM CHLORIDE 3 % IN NEBU
4.0000 mL | INHALATION_SOLUTION | Freq: Three times a day (TID) | RESPIRATORY_TRACT | Status: DC
  Administered 2018-07-21 – 2018-07-22 (×5): 4 mL via RESPIRATORY_TRACT
  Filled 2018-07-20 (×7): qty 4

## 2018-07-20 MED ORDER — SODIUM CHLORIDE 0.9 % IV BOLUS (SEPSIS)
250.0000 mL | Freq: Once | INTRAVENOUS | Status: AC
  Administered 2018-07-20: 250 mL via INTRAVENOUS

## 2018-07-20 NOTE — ED Notes (Signed)
Ed provider Charm BargesButler notified patient has a critical lactic acid value of 2.82

## 2018-07-20 NOTE — Progress Notes (Signed)
Pharmacy Antibiotic Note  Brittany Powers is a 84 y.o. female admitted on 07/20/2018 with sepsis.  Pharmacy has been consulted for vancomycin and Zosyn dosing.  Plan:  Zosyn 3.375g IV q8h (4 hour infusion).   Vancomycin 1000 mg IV x 1 in ED, then 500 mg IV q24h  Monitor clinical course, renal function, cultures as available  Daily Scr while on combination of Zosyn and cefepime    Weight: 114 lb 6.4 oz (51.9 kg)  Temp (24hrs), Avg:102.2 F (39 C), Min:100.3 F (37.9 C), Max:104 F (40 C)  Recent Labs  Lab 07/20/18 1207 07/20/18 1305 07/20/18 1433  WBC 22.5*  --   --   CREATININE 0.78  --   --   LATICACIDVEN  --  2.82* 2.69*    CrCl cannot be calculated (Unknown ideal weight.).    Allergies  Allergen Reactions  . Bee Venom Itching and Swelling    Unknown swelling location     Antimicrobials this admission: 11/25 cefepime x 1  11/25 Zosyn >>  11/25 Vancomycin >>   Dose adjustments this admission:    Microbiology results: 11/25 BCx:  11/25 UCx:    Sputum:      Thank you for allowing pharmacy to be a part of this patient's care.  Adalberto ColeNikola Jaaziel Peatross, PharmD, BCPS Pager [phone] 07/20/2018 5:16 PM

## 2018-07-20 NOTE — ED Notes (Signed)
Patient transported to CT 

## 2018-07-20 NOTE — ED Triage Notes (Signed)
Pt BIB EMS from SNF.  Staff called for "seizures" but EMS reports non-seizure like activity of upper arms shaking.  Pt not able to follow commands but did perform eye tracking.  Pt altered/non verbal at baseline. Hx of dementia.  20g in L AC.   VS 186/86; 120 HR; R 34; O2- 94% RA; EtCO2 15; CBG- 151.

## 2018-07-20 NOTE — ED Notes (Signed)
ED TO INPATIENT HANDOFF REPORT  Name/Age/Gender Brittany Powers 84 y.o. female  Code Status    Code Status Orders  (From admission, onward)         Start     Ordered   07/20/18 1552  Do not attempt resuscitation (DNR)  Continuous    Question Answer Comment  In the event of cardiac or respiratory ARREST Do not call a "code blue"   In the event of cardiac or respiratory ARREST Do not perform Intubation, CPR, defibrillation or ACLS   In the event of cardiac or respiratory ARREST Use medication by any route, position, wound care, and other measures to relive pain and suffering. May use oxygen, suction and manual treatment of airway obstruction as needed for comfort.      07/20/18 1551        Code Status History    Date Active Date Inactive Code Status Order ID Comments User Context   04/11/2016 0126 04/13/2016 2006 DNR [id number]  Lily Kocher, MD Inpatient   02/08/2015 1255 02/09/2015 1936 DNR [id number]  Blain Pais, MD Inpatient    Advance Directive Documentation     Most Recent Value  Type of Advance Directive  Out of facility DNR (pink MOST or yellow form)  Pre-existing out of facility DNR order (yellow form or pink MOST form)  Yellow form placed in chart (order not valid for inpatient use)  "MOST" Form in Place?  -      Home/SNF/Other Skilled nursing facility  Chief Complaint sepsis  Level of Care/Admitting Diagnosis ED Disposition    ED Disposition Condition [place]: [place] [[id number]]  Level of Care: Telemetry [5]  Admit to tele based on following criteria: Monitor for Ischemic changes  Diagnosis: Sepsis [facility]) [[id number]]  Admitting Physician: Kayleen Memos [[id number]]  Attending Physician: Kayleen Memos [[id number]]  Estimated length of stay: past midnight tomorrow  Certification:: I certify this patient will need inpatient services for at least 2 midnights  PT Class (Do Not Modify): Inpatient [101]  PT Acc Code  (Do Not Modify): Private [1]       Medical History Past Medical History:  Diagnosis Date  . COPD (chronic obstructive pulmonary disease) ([place])   . CVA (cerebral infarction)   . Dementia ([place])   . High cholesterol   . Hypertension   . Incontinent of urine     Allergies Allergies  Allergen Reactions  . Bee Venom Itching and Swelling    Unknown swelling location     IV Location/Drains/Wounds Patient Lines/Drains/Airways Status   Active Line/Drains/Airways    Name:   Placement date:   Placement time:   Site:   Days:   Peripheral IV 07/20/18 Left Antecubital   07/20/18    -    Antecubital   less than 1   Wound / Incision (Open or Dehisced) 02/08/15 Other (Comment) Head Right;Posterior   02/08/15    -    Head   1258          Labs/Imaging Results for orders placed or performed during the hospital encounter of 07/20/18 (from the past 48 hour(s))  Comprehensive metabolic panel     Status: Abnormal   Collection Time: 07/20/18 12:07 PM  Result Value Ref Range   Sodium 143 135 - 145 mmol/L   Potassium 3.7 3.5 - 5.1 mmol/L   Chloride 107 98 - 111 mmol/L   CO2 26 22 - 32 mmol/L   Glucose,  Bld 149 (H) 70 - 99 mg/dL   BUN 18 8 - 23 mg/dL   Creatinine, Ser 0.78 0.44 - 1.00 mg/dL   Calcium 9.3 8.9 - 10.3 mg/dL   Total Protein 7.5 6.5 - 8.1 g/dL   Albumin 3.9 3.5 - 5.0 g/dL   AST 24 15 - 41 U/L   ALT 16 0 - 44 U/L   Alkaline Phosphatase 64 38 - 126 U/L   Total Bilirubin 1.0 0.3 - 1.2 mg/dL   GFR calc non Af Amer >60 >60 mL/min   GFR calc Af Amer >60 >60 mL/min    Comment: (NOTE) The eGFR has been calculated using the CKD EPI equation. This calculation has not been validated in all clinical situations. eGFR's persistently <60 mL/min signify possible Chronic Kidney Disease.    Anion gap 10 5 - 15    Comment: Performed at [facility], [place] [address], [place], [place] [address]  CBC WITH DIFFERENTIAL     Status: Abnormal   Collection Time: 07/20/18 12:07  PM  Result Value Ref Range   WBC 22.5 (H) 4.0 - 10.5 K/uL   RBC 4.51 3.87 - 5.11 MIL/uL   Hemoglobin 14.6 12.0 - 15.0 g/dL   HCT 45.3 36.0 - 46.0 %   MCV 100.4 (H) 80.0 - 100.0 fL   MCH 32.4 26.0 - 34.0 pg   MCHC 32.2 30.0 - 36.0 g/dL   RDW 12.9 11.5 - 15.5 %   Platelets 245 150 - 400 K/uL   nRBC 0.0 0.0 - 0.2 %   Neutrophils Relative % 95 %   Neutro Abs 21.4 (H) 1.7 - 7.7 K/uL   Lymphocytes Relative 2 %   Lymphs Abs 0.4 (L) 0.7 - 4.0 K/uL   Monocytes Relative 1 %   Monocytes Absolute 0.3 0.1 - 1.0 K/uL   Eosinophils Relative 0 %   Eosinophils Absolute 0.0 0.0 - 0.5 K/uL   Basophils Relative 0 %   Basophils Absolute 0.1 0.0 - 0.1 K/uL   Immature Granulocytes 2 %   Abs Immature Granulocytes 0.34 (H) 0.00 - 0.07 K/uL    Comment: Performed at [facility], [place] [address], [place], [place] [address]  Urinalysis, Routine w reflex microscopic     Status: None   Collection Time: 07/20/18 12:07 PM  Result Value Ref Range   Color, Urine YELLOW YELLOW   APPearance CLEAR CLEAR   Specific Gravity, Urine 1.012 1.005 - 1.030   pH 5.0 5.0 - 8.0   Glucose, UA NEGATIVE NEGATIVE mg/dL   Hgb urine dipstick NEGATIVE NEGATIVE   Bilirubin Urine NEGATIVE NEGATIVE   Ketones, ur NEGATIVE NEGATIVE mg/dL   Protein, ur NEGATIVE NEGATIVE mg/dL   Nitrite NEGATIVE NEGATIVE   Leukocytes, UA NEGATIVE NEGATIVE    Comment: Performed at [place] [address] [place], [place] [address]  I-stat troponin, ED (not at [facility], [facility])     Status: None   Collection Time: 07/20/18  1:03 PM  Result Value Ref Range   Troponin i, poc 0.01 0.00 - 0.08 ng/mL   Comment 3            Comment: Due to the release kinetics of cTnI, a negative result within the first hours of the onset of symptoms does not rule out myocardial infarction with certainty. If myocardial infarction is still suspected, repeat the test at appropriate intervals.   I-Stat CG4 Lactic Acid, ED     Status:  Abnormal  Collection Time: 07/20/18  1:05 PM  Result Value Ref Range   Lactic Acid, Venous 2.82 (HH) 0.5 - 1.9 mmol/L   Comment NOTIFIED PHYSICIAN   I-Stat CG4 Lactic Acid, ED     Status: Abnormal   Collection Time: 07/20/18  2:33 PM  Result Value Ref Range   Lactic Acid, Venous 2.69 (HH) 0.5 - 1.9 mmol/L   Comment NOTIFIED PHYSICIAN    Dg Chest Port 1 View  Result Date: 07/20/2018 CLINICAL DATA:  Fever and vomiting. EXAM: PORTABLE CHEST 1 VIEW COMPARISON:  Two-view chest x-ray 04/12/2016 FINDINGS: The heart size is normal. Aortic atherosclerosis is present. There is no edema or effusion. A moderate-sized hiatal hernia is present. No focal airspace disease is present. Degenerative changes of the thoracic spine are again noted. IMPRESSION: 1. No acute cardiopulmonary disease. 2. Aortic atherosclerosis. 3. Moderate-sized hiatal hernia. Electronically Signed   By: San Morelle M.D.   On: 07/20/2018 14:08    Pending Labs Unresulted Labs (From admission, onward)    Start     Ordered   07/20/18 1207  Blood Culture (routine x 2)  BLOOD CULTURE X 2,   STAT    Question:  Patient immune status  Answer:  Normal   07/20/18 1207   07/20/18 1207  Urine culture  ONCE - STAT,   STAT    Question:  Patient immune status  Answer:  Normal   07/20/18 1207          Vitals/Pain Today's Vitals   07/20/18 1430 07/20/18 1440 07/20/18 1500 07/20/18 1530  BP: (!) 197/170  (!) 158/81 (!) 155/84  Pulse: 78  (!) 121 (!) 117  Resp: (!) 24  (!) 33 (!) 34  Temp:  100.3 F (37.9 C)    TempSrc:  Rectal    SpO2: 94%  96% 97%  Weight:        Isolation Precautions No active isolations  Medications Medications  vancomycin (VANCOCIN) IVPB 1000 mg/200 mL premix (1,000 mg Intravenous New Bag/Given 07/20/18 1537)  piperacillin-tazobactam (ZOSYN) IVPB 3.375 g (has no administration in time range)  iopamidol (ISOVUE-300) 61 % injection (has no administration in time range)  sodium chloride (PF) 0.9 %  injection (has no administration in time range)  sodium chloride 0.9 % bolus 1,000 mL (0 mLs Intravenous Stopped 07/20/18 1541)    And  sodium chloride 0.9 % bolus 1,000 mL (1,000 mLs Intravenous New Bag/Given 07/20/18 1417)    And  sodium chloride 0.9 % bolus 250 mL (250 mLs Intravenous New Bag/Given 07/20/18 1536)  acetaminophen (TYLENOL) suppository 650 mg (650 mg Rectal Given 07/20/18 1247)  ceFEPIme (MAXIPIME) 2 g in sodium chloride 0.9 % 100 mL IVPB (0 g Intravenous Stopped 07/20/18 1541)  piperacillin-tazobactam (ZOSYN) IVPB 3.375 g (3.375 g Intravenous New Bag/Given 07/20/18 1540)  iopamidol (ISOVUE-300) 61 % injection 100 mL (100 mLs Intravenous Contrast Given 07/20/18 1606)    Mobility non-ambulatory

## 2018-07-20 NOTE — Progress Notes (Signed)
Pharmacy Note   A consult was received from an ED physician for vancomycin and cefepime per pharmacy dosing.    The patient's profile has been reviewed for ht/wt/allergies/indication/available labs.    A one time order has been placed for vancomycin 1000 mg IV x1 and cefepime 2 gr IV x1 .  Further antibiotics/pharmacy consults should be ordered by admitting physician if indicated.                       Thank you,  Derris Millan GlogovAdalberto Coleac, PharmD, BCPS Pager [phone] 07/20/2018 2:35 PM

## 2018-07-20 NOTE — ED Notes (Signed)
DR Charm BargesButler notified of patient's lactic acid result.

## 2018-07-20 NOTE — ED Notes (Signed)
Bed: [id number] Expected date:  Expected time:  Means of arrival:  Comments: EMS 84yo sepsis

## 2018-07-20 NOTE — ED Provider Notes (Signed)
[facility]-EMERGENCY DEPT Provider Note   CSN: [id number] Arrival date & time: 07/20/18  1156     History   Chief Complaint No chief complaint on file.   HPI Brittany Powers is a 84 y.o. female.  5 caveat secondary to nonverbal.  Patient is a resident of memory ward who was transferred here for shaking.  EMS noted the patient to be in [place]'s and found her to be febrile.  She has some yellow vomitus on her clothing.  No other information is available at this time.  The history is provided by the EMS personnel.  Fever   This is a new problem. Episode onset: unknown. Associated symptoms include vomiting. She has tried nothing for the symptoms.    Past Medical History:  Diagnosis Date  . COPD (chronic obstructive pulmonary disease) ([facility])   . CVA (cerebral infarction)   . Dementia ([facility])   . High cholesterol   . Hypertension   . Incontinent of urine     Patient Active Problem List   Diagnosis Date Noted  . Sepsis ([facility]) 04/10/2016  . UTI (lower urinary tract infection) 04/10/2016  . Leukocytosis 04/10/2016  . Fever 04/10/2016  . Epidural hematoma ([facility]) 02/08/2015  . Fall at nursing home 02/08/2015  . HTN (hypertension) 02/08/2015  . Aphasia as late effect of cerebrovascular accident 02/08/2015  . Dementia ([facility]) 02/08/2015    Past Surgical History:  Procedure Laterality Date  . NO PAST SURGERIES       OB History   None      Home Medications    Prior to Admission medications   Medication Sig Start Date End Date Taking? Authorizing Provider  acetaminophen (TYLENOL) 500 MG tablet Take 500 mg by mouth every 4 (four) hours as needed for mild pain, moderate pain, fever or headache.     [provider]  alum & mag hydroxide-simeth (MINTOX) 200-200-20 MG/5ML suspension Take 30 mLs by mouth as needed for indigestion or heartburn.    [provider]  amoxicillin-clavulanate (AUGMENTIN) 875-125 MG tablet Take 1 tablet by mouth  every 12 (twelve) hours. 06/02/18   Cardama, Amadeo Garnet, MD  calcium carbonate (OS-CAL - DOSED IN MG OF ELEMENTAL CALCIUM) 1250 (500 CA) MG tablet Take 1 tablet (500 mg of elemental calcium total) by mouth daily with breakfast. 02/09/15   Otis Brace, MD  cephALEXin (KEFLEX) 500 MG capsule Take 1 capsule (500 mg total) by mouth 4 (four) times daily. Patient not taking: Reported on 04/21/2018 04/13/16   Regalado, Jon Billings A, MD  fish oil-omega-3 fatty acids 1000 MG capsule Take 1 g by mouth 2 (two) times daily.    [provider]  guaifenesin (ROBITUSSIN) 100 MG/5ML syrup Take 200 mg by mouth every 6 (six) hours as needed for cough.     [provider]  hydrochlorothiazide (HYDRODIURIL) 12.5 MG tablet Take 12.5 mg by mouth daily with breakfast.    [provider]  levalbuterol (XOPENEX) 0.63 MG/3ML nebulizer solution Take 3 mLs (0.63 mg total) by nebulization every 6 (six) hours as needed for wheezing or shortness of breath. 04/13/16   Regalado, Belkys A, MD  magnesium hydroxide (MILK OF MAGNESIA) 400 MG/5ML suspension Take 30 mLs by mouth at bedtime as needed for mild constipation.     [provider]  neomycin-bacitracin-polymyxin (NEOSPORIN) OINT Apply 1 application topically daily as needed for wound care.    [provider]  Skin Protectants, Misc. (BAZA PROTECT EX) Apply 1 application topically as  needed (with every incontinent episode). Apply to groin/buttocks    [provider]  verapamil (CALAN) 80 MG tablet Take 80 mg by mouth 3 (three) times daily.     [provider]  Vitamin D, Ergocalciferol, (DRISDOL) 50000 UNITS CAPS capsule Take 50,000 Units by mouth every 30 (thirty) days.     [provider]    Family History No family history on file.  Social History Social History   Tobacco Use  . Smoking status: Former Smoker    Types: Cigarettes    Last attempt to quit: 12/26/2014    Years since quitting: 3.5  .  Smokeless tobacco: Never Used  . Tobacco comment: " smoked most of her life "  Substance Use Topics  . Alcohol use: No  . Drug use: No     Allergies   Bee venom   Review of Systems Review of Systems  Unable to perform ROS: Patient nonverbal  Constitutional: Positive for fever.  Gastrointestinal: Positive for vomiting.     Physical Exam Updated Vital Signs BP (!) 155/84   Pulse (!) 117   Temp 100.3 F (37.9 C) (Rectal)   Resp (!) 34   Wt 51.9 kg   SpO2 97%   BMI 20.92 kg/m   Physical Exam  Constitutional: She appears well-developed and well-nourished. No distress.  HENT:  Head: Normocephalic and atraumatic.  Eyes:  Conjunctival injection no discharge.  Neck: Neck supple. No tracheal deviation present.  Cardiovascular: Regular rhythm and intact distal pulses. Tachycardia present.  No murmur heard. Pulmonary/Chest: Effort normal and breath sounds normal. No respiratory distress.  Abdominal: Soft. There is no tenderness. There is no guarding.  Musculoskeletal: Normal range of motion. She exhibits no edema, tenderness or deformity.  Neurological: She is alert.  She is awake but will not answer any questions.  Not following commands.  Does seem to be able to move everything.  Skin: Skin is warm and dry.  Psychiatric: She has a normal mood and affect.  Nursing note and vitals reviewed.    ED Treatments / Results  Labs (all labs ordered are listed, but only abnormal results are displayed) Labs Reviewed  COMPREHENSIVE METABOLIC PANEL - Abnormal; Notable for the following components:      Result Value   Glucose, Bld 149 (*)    All other components within normal limits  CBC WITH DIFFERENTIAL/PLATELET - Abnormal; Notable for the following components:   WBC 22.5 (*)    MCV 100.4 (*)    Neutro Abs 21.4 (*)    Lymphs Abs 0.4 (*)    Abs Immature Granulocytes 0.34 (*)    All other components within normal limits  I-STAT CG4 LACTIC ACID, ED - Abnormal; Notable for the  following components:   Lactic Acid, Venous 2.82 (*)    All other components within normal limits  I-STAT CG4 LACTIC ACID, ED - Abnormal; Notable for the following components:   Lactic Acid, Venous 2.69 (*)    All other components within normal limits  CULTURE, BLOOD (ROUTINE X 2)  CULTURE, BLOOD (ROUTINE X 2)  URINE CULTURE  URINALYSIS, ROUTINE W REFLEX MICROSCOPIC  I-STAT TROPONIN, ED    EKG EKG Interpretation  Date/Time:  Monday July 20 2018 14:43:25 EST Ventricular Rate:  145 PR Interval:    QRS Duration: 122 QT Interval:  284 QTC Calculation: 407 R Axis:   27 Text Interpretation:  Wide-QRS tachycardia Paired ventricular premature complexes IVCD, consider atypical RBBB increased rate and ectopy from prior today  Confirmed by Meridee Score [phone]) on 07/20/2018 3:13:20 PM   Radiology Dg Chest Port 1 View  Result Date: 07/20/2018 CLINICAL DATA:  Fever and vomiting. EXAM: PORTABLE CHEST 1 VIEW COMPARISON:  Two-view chest x-ray 04/12/2016 FINDINGS: The heart size is normal. Aortic atherosclerosis is present. There is no edema or effusion. A moderate-sized hiatal hernia is present. No focal airspace disease is present. Degenerative changes of the thoracic spine are again noted. IMPRESSION: 1. No acute cardiopulmonary disease. 2. Aortic atherosclerosis. 3. Moderate-sized hiatal hernia. Electronically Signed   By: Marin Roberts M.D.   On: 07/20/2018 14:08    Procedures .Critical Care Performed by: Terrilee Files, MD Authorized by: Terrilee Files, MD   Critical care provider statement:    Critical care time (minutes):  45   Critical care was necessary to treat or prevent imminent or life-threatening deterioration of the following conditions:  Sepsis   Critical care was time spent personally by me on the following activities:  Discussions with consultants, evaluation of patient's response to treatment, examination of patient, ordering and performing treatments  and interventions, ordering and review of laboratory studies, ordering and review of radiographic studies, pulse oximetry, re-evaluation of patient's condition, obtaining history from patient or surrogate, review of old charts and development of treatment plan with patient or surrogate   I assumed direction of critical care for this patient from another provider in my specialty: yes     (including critical care time)  Medications Ordered in ED Medications  vancomycin (VANCOCIN) IVPB 1000 mg/200 mL premix (1,000 mg Intravenous New Bag/Given 07/20/18 1537)  piperacillin-tazobactam (ZOSYN) IVPB 3.375 g (3.375 g Intravenous New Bag/Given 07/20/18 1540)  piperacillin-tazobactam (ZOSYN) IVPB 3.375 g (has no administration in time range)  sodium chloride 0.9 % bolus 1,000 mL (0 mLs Intravenous Stopped 07/20/18 1541)    And  sodium chloride 0.9 % bolus 1,000 mL (1,000 mLs Intravenous New Bag/Given 07/20/18 1417)    And  sodium chloride 0.9 % bolus 250 mL (250 mLs Intravenous New Bag/Given 07/20/18 1536)  acetaminophen (TYLENOL) suppository 650 mg (650 mg Rectal Given 07/20/18 1247)  ceFEPIme (MAXIPIME) 2 g in sodium chloride 0.9 % 100 mL IVPB (0 g Intravenous Stopped 07/20/18 1541)     Initial Impression / Assessment and Plan / ED Course  I have reviewed the triage vital signs and the nursing notes.  Pertinent labs & imaging results that were available during my care of the patient were reviewed by me and considered in my medical decision making (see chart for details).  Clinical Course as of Jul 20 1546  Mon Jul 20, 2018  1212 CODE STATUS is DNR/DNI.   [MB]  1314 Patient's initial lactate is elevated at 2.82.  Initial fluids started.  Holding on antibiotics for identification of a possible source.   [MB]  1454 Discussed with Dr. Margo Aye from the hospitalist service who will evaluate the patient for admission.  We talked about imaging and agree that she should get a CT chest and abdomen and  pelvis.  Likely this is aspiration but cannot exclude an abdominal source of her fever with her being nonverbal.   [MB]  1454 Heart rate is now accelerated.  She is having what looks to be a sinus tach with some PVCs.  She is on verapamil and unlikely to have had her meds recently.  No history of A. fib and looks to be sinus.  Consider some rate control with IV verapamil.   [MB]  Clinical Course User Index [MB] Terrilee FilesButler, Spenser Cong C, MD    Final Clinical Impressions(s) / ED Diagnoses   Final diagnoses:  Sepsis, due to unspecified organism, unspecified whether acute organ dysfunction present [facility](HCC)    ED Discharge Orders    None       Terrilee FilesButler, Waynette Towers C, MD 07/20/18 [phone]

## 2018-07-20 NOTE — ED Notes (Signed)
Attempted to start an IV and obtain blood samples but was unsuccessful. Ask Aundra MilletMegan, RN to attempt at an IV.

## 2018-07-20 NOTE — Progress Notes (Addendum)
Per MD, patient will be rerouted to stepdown. Ed charge nurse aware [facility].

## 2018-07-20 NOTE — H&P (Signed)
History and Physical  Brittany Powers ONG:[id number] DOB: 05/23/1931 DOA: 07/20/2018  Referring physician: Dr Charm BargesButler  PCP: Donn PieriniBowen-Pasfield, Sarah D, MD  Outpatient Specialists: None Patient coming from: SNF  Chief Complaint: Fever, productive cough, shaking with emesis on shirt per SNF  HPI: Brittany Powers is a 84 y.o. female with medical history significant for dysphagia on pured diet, CVA wheelchair-bound and aphasic at baseline, dementia, who [facility] from SNF due to fever, productive cough, and emesis on shirt.  Patient has baseline dementia and unable to provide a history.  Most of history is obtained from her 2 brothers and one sister, ED physician, and medical records.  Per her brother patient was in her normal state of health on Sunday yesterday.  At baseline patient is aphasic and more alert.  Early this morning patient was seen with emesis on her shirt, had a productive cough.  Was sent to the ED for further evaluation.  TR[facility] asked to admit.  ED Course: Upon presentation to the ED patient is tachycardic with heart rate in the 150s.  Lab studies remarkable for leukocytosis with WBC 22,000.  Chest x-ray done in the ED revealed right middle lobe infiltrates with suspicion for aspiration.  Patient was started on IV antibiotics empirically.  Due to emesis on her shirt and patient unable to provide a history CT abdomen and pelvis with contrast done to rule out any acute infective processes.  Review of Systems: Review of systems as noted in the HPI. All other systems reviewed and are negative.   Past Medical History:  Diagnosis Date  . COPD (chronic obstructive pulmonary disease) (HCC)   . CVA (cerebral infarction)   . Dementia (HCC)   . High cholesterol   . Hypertension   . Incontinent of urine    Past Surgical History:  Procedure Laterality Date  . NO PAST SURGERIES      Social History:  reports that she quit smoking about 3 years ago. Her smoking use included  cigarettes. She has never used smokeless tobacco. She reports that she does not drink alcohol or use drugs.   Allergies  Allergen Reactions  . Bee Venom Itching and Swelling    Unknown swelling location     No family history on file.    Prior to Admission medications   Medication Sig Start Date End Date Taking? Authorizing Provider  acetaminophen (TYLENOL) 500 MG tablet Take 500 mg by mouth every 4 (four) hours as needed for mild pain, moderate pain, fever or headache.    Yes [provider]  alum & mag hydroxide-simeth (MINTOX) 200-200-20 MG/5ML suspension Take 30 mLs by mouth as needed for indigestion or heartburn.   Yes [provider]  guaifenesin (ROBITUSSIN) 100 MG/5ML syrup Take 200 mg by mouth every 6 (six) hours as needed for cough.    Yes [provider]  hydrochlorothiazide (HYDRODIURIL) 12.5 MG tablet Take 12.5 mg by mouth daily with breakfast.   Yes [provider]  levalbuterol (XOPENEX) 0.63 MG/3ML nebulizer solution Take 3 mLs (0.63 mg total) by nebulization every 6 (six) hours as needed for wheezing or shortness of breath. 04/13/16  Yes Regalado, Belkys A, MD  magnesium hydroxide (MILK OF MAGNESIA) 400 MG/5ML suspension Take 30 mLs by mouth at bedtime as needed for mild constipation.    Yes [provider]  neomycin-bacitracin-polymyxin (NEOSPORIN) OINT Apply 1 application topically daily as needed for wound care.   Yes [provider]  potassium chloride 20 MEQ/15ML (  10%) SOLN Take 20 mEq by mouth daily.   Yes [provider]  Propylene Glycol-Glycerin (MOISTURE EYES) 1-0.3 % SOLN Place 1 drop into both eyes 3 (three) times daily.   Yes [provider]  Skin Protectants, Misc. (BAZA PROTECT EX) Apply 1 application topically as needed (with every incontinent episode). Apply to groin/buttocks   Yes [provider]  verapamil (CALAN) 80 MG tablet Take 80 mg by mouth 3 (three) times daily.    Yes  [provider]  amoxicillin-clavulanate (AUGMENTIN) 875-125 MG tablet Take 1 tablet by mouth every 12 (twelve) hours. Patient not taking: Reported on 07/20/2018 06/02/18   Nira Conn, MD  calcium carbonate (OS-CAL - DOSED IN MG OF ELEMENTAL CALCIUM) 1250 (500 CA) MG tablet Take 1 tablet (500 mg of elemental calcium total) by mouth daily with breakfast. Patient not taking: Reported on 07/20/2018 02/09/15   Otis Brace, MD  cephALEXin (KEFLEX) 500 MG capsule Take 1 capsule (500 mg total) by mouth 4 (four) times daily. Patient not taking: Reported on 04/21/2018 04/13/16   Alba Cory, MD    Physical Exam: BP (!) 155/84   Pulse (!) 117   Temp 100.3 F (37.9 C) (Rectal)   Resp (!) 34   Wt 51.9 kg   SpO2 97%   BMI 20.92 kg/m    . General: 84 y.o. year-old female frail somnolent but easily arousable to voices.  Has a baseline dementia and is aphasic.   . Cardiovascular: Regular rate and rhythm with no rubs or gallops.  No thyromegaly or JVD noted.  No lower extremity edema. 2/4 pulses in all 4 extremities. Marland Kitchen Respiratory: There is diffused rales bilaterally with no noted wheezes.  Poor inspiratory effort. . Abdomen: Soft nontender nondistended with normal bowel sounds x4 quadrants. . Muskuloskeletal: No cyanosis, clubbing or edema noted bilaterally . Neuro: CN II-XII intact, strength, sensation, reflexes . Skin: No ulcerative lesions noted or rashes with limited exam. . Psychiatry: Judgement and insight appear poor. Mood is appropriate for condition and setting          Labs on Admission:  Basic Metabolic Panel: Recent Labs  Lab 07/20/18 1207  NA 143  K 3.7  CL 107  CO2 26  GLUCOSE 149*  BUN 18  CREATININE 0.78  CALCIUM 9.3   Liver Function Tests: Recent Labs  Lab 07/20/18 1207  AST 24  ALT 16  ALKPHOS 64  BILITOT 1.0  PROT 7.5  ALBUMIN 3.9   No results for input(s): LIPASE, AMYLASE in the last 168 hours. No results for input(s): AMMONIA  in the last 168 hours. CBC: Recent Labs  Lab 07/20/18 1207  WBC 22.5*  NEUTROABS 21.4*  HGB 14.6  HCT 45.3  MCV 100.4*  PLT 245   Cardiac Enzymes: No results for input(s): CKTOTAL, CKMB, CKMBINDEX, TROPONINI in the last 168 hours.  BNP (last 3 results) No results for input(s): BNP in the last 8760 hours.  ProBNP (last 3 results) No results for input(s): PROBNP in the last 8760 hours.  CBG: No results for input(s): GLUCAP in the last 168 hours.  Radiological Exams on Admission: Dg Chest Port 1 View  Result Date: 07/20/2018 CLINICAL DATA:  Fever and vomiting. EXAM: PORTABLE CHEST 1 VIEW COMPARISON:  Two-view chest x-ray 04/12/2016 FINDINGS: The heart size is normal. Aortic atherosclerosis is present. There is no edema or effusion. A moderate-sized hiatal hernia is present. No focal airspace disease is present. Degenerative changes of the thoracic spine are again  noted. IMPRESSION: 1. No acute cardiopulmonary disease. 2. Aortic atherosclerosis. 3. Moderate-sized hiatal hernia. Electronically Signed   By: Marin Roberts M.D.   On: 07/20/2018 14:08    EKG: I independently viewed the EKG done and my findings are as followed: A. fib with RVR 145.  No specific ST-T changes.  Assessment/Plan Present on Admission: . Sepsis (HCC)  Active Problems:   Sepsis (HCC)  Sepsis secondary to suspected aspiration pneumonia versus orders Start IV vancomycin and IV Zosyn Obtain sputum culture Obtain blood cultures x2 peripherally Duo nebs every 6 hours and every 2 hours as needed for shortness of breath and wheezing O2 supplementation to maintain O2 saturation greater than 92% Admit to stepdown unit and closely monitor vital signs CT chest, abdomen and pelvis with contrast pending  New onset A. fib RVR Twelve-lead EKG revealed rate in the 140s We will give 1 dose of Lopressor 5 mg IV once If no improvement will start Cardizem drip Suspect secondary to sepsis If no improvement  consider 2D echo and cardiology consult in the morning  Dehydration in the setting of dysphagia Patient on pured diet Keep n.p.o. for now until passes swallow evaluation Start gentle IV fluid D5 half normal saline at 75 cc/h Monitor urine output Monitor electrolytes  Vomiting with unclear etiology Reported by staff at SNF CT abdomen and pelvis with contrast done awaiting results Covering with IV Zosyn  History of CVA with aphasia and wheelchair-bound Fall precautions Not on aspirin or statin  Hypertension Uncontrolled N.p.o. due to dysphagia and suspected aspiration IV Lopressor 5 mg every 6 hours  Ambulatory dysfunction/physical debility PT OT to assess Fall precautions  Patient will require at least 2 midnights for IV antibiotics and IV fluid due to poor oral intake and suspicion for aspiration.  Patient is currently unstable and is going to require close monitoring in the stepdown unit.  Patient has multiple comorbidities and advanced age increasing her risk of mortality.    DVT prophylaxis: Subcu Lovenox daily  Code Status: DNR  Family Communication: Spoke with her 2 brothers and 1 sister.  All questions answered to their satisfaction.  Disposition Plan: Admit to stepdown unit  Consults called: None  Admission status: Inpatient    Darlin Drop MD [facility] Pager [phone]  If 7PM-7AM, please contact night-coverage [email or web address] Password [facility]  07/20/2018, 4:45 PM

## 2018-07-21 LAB — COMPREHENSIVE METABOLIC PANEL
ALBUMIN: 3.1 g/dL — AB (ref 3.5–5.0)
ALK PHOS: 49 U/L (ref 38–126)
ALT: 14 U/L (ref 0–44)
ANION GAP: 8 (ref 5–15)
AST: 25 U/L (ref 15–41)
BUN: 17 mg/dL (ref 8–23)
CALCIUM: 8.4 mg/dL — AB (ref 8.9–10.3)
CO2: 23 mmol/L (ref 22–32)
Chloride: 111 mmol/L (ref 98–111)
Creatinine, Ser: 0.85 mg/dL (ref 0.44–1.00)
GFR calc Af Amer: >60 mL/min (ref >60)
GFR calc non Af Amer: >60 mL/min (ref >60)
GLUCOSE: 156 mg/dL — AB (ref 70–99)
Potassium: 3.1 mmol/L — ABNORMAL LOW (ref 3.5–5.1)
SODIUM: 142 mmol/L (ref 135–145)
Total Bilirubin: 1.1 mg/dL (ref 0.3–1.2)
Total Protein: 6.2 g/dL — ABNORMAL LOW (ref 6.5–8.1)

## 2018-07-21 LAB — CBC WITH DIFFERENTIAL/PLATELET
Abs Immature Granulocytes: 0.47 10*3/uL — ABNORMAL HIGH (ref 0.00–0.07)
BASOS ABS: 0.1 10*3/uL (ref 0.0–0.1)
Basophils Relative: 0 %
Eosinophils Absolute: 0 10*3/uL (ref 0.0–0.5)
Eosinophils Relative: 0 %
HCT: 38.6 % (ref 36.0–46.0)
HEMOGLOBIN: 12.6 g/dL (ref 12.0–15.0)
IMMATURE GRANULOCYTES: 2 %
LYMPHS ABS: 2.2 10*3/uL (ref 0.7–4.0)
LYMPHS PCT: 7 %
MCH: 32.5 pg (ref 26.0–34.0)
MCHC: 32.6 g/dL (ref 30.0–36.0)
MCV: 99.5 fL (ref 80.0–100.0)
Monocytes Absolute: 1.2 10*3/uL — ABNORMAL HIGH (ref 0.1–1.0)
Monocytes Relative: 4 %
NEUTROS ABS: 28.3 10*3/uL — AB (ref 1.7–7.7)
NRBC: 0 % (ref 0.0–0.2)
Neutrophils Relative %: 87 %
Platelets: 224 10*3/uL (ref 150–400)
RBC: 3.88 MIL/uL (ref 3.87–5.11)
RDW: 13.2 % (ref 11.5–15.5)
WBC MORPHOLOGY: INCREASED
WBC: 32.3 10*3/uL — ABNORMAL HIGH (ref 4.0–10.5)

## 2018-07-21 LAB — URINE CULTURE
Culture: NO GROWTH
Special Requests: NORMAL

## 2018-07-21 LAB — GLUCOSE, CAPILLARY
GLUCOSE-CAPILLARY: 148 mg/dL — AB (ref 70–99)
Glucose-Capillary: 105 mg/dL — ABNORMAL HIGH (ref 70–99)
Glucose-Capillary: 130 mg/dL — ABNORMAL HIGH (ref 70–99)
Glucose-Capillary: 135 mg/dL — ABNORMAL HIGH (ref 70–99)

## 2018-07-21 LAB — LACTIC ACID, PLASMA
Lactic Acid, Venous: 2.3 mmol/L (ref 0.5–1.9)
Lactic Acid, Venous: 2.1 mmol/L (ref 0.5–1.9)

## 2018-07-21 LAB — TROPONIN I
TROPONIN I: 0.29 ng/mL — AB (ref <0.03)
Troponin I: 0.25 ng/mL (ref <0.03)

## 2018-07-21 LAB — PHOSPHORUS: Phosphorus: 2.9 mg/dL (ref 2.5–4.6)

## 2018-07-21 LAB — MAGNESIUM: Magnesium: 1.7 mg/dL (ref 1.7–2.4)

## 2018-07-21 MED ORDER — ORAL CARE MOUTH RINSE
15.0000 mL | Freq: Two times a day (BID) | OROMUCOSAL | Status: DC
  Administered 2018-07-21 – 2018-07-24 (×7): 15 mL via OROMUCOSAL

## 2018-07-21 MED ORDER — METOPROLOL TARTRATE 5 MG/5ML IV SOLN
5.0000 mg | Freq: Once | INTRAVENOUS | Status: AC
  Administered 2018-07-21: 5 mg via INTRAVENOUS

## 2018-07-21 MED ORDER — DIGOXIN 0.25 MG/ML IJ SOLN
0.5000 mg | Freq: Once | INTRAMUSCULAR | Status: AC
  Administered 2018-07-21: 0.5 mg via INTRAVENOUS
  Filled 2018-07-21: qty 2

## 2018-07-21 MED ORDER — SODIUM CHLORIDE 0.9 % IV SOLN
INTRAVENOUS | Status: DC | PRN
  Administered 2018-07-21: 500 mL via INTRAVENOUS
  Administered 2018-07-22: 250 mL via INTRAVENOUS

## 2018-07-21 MED ORDER — POTASSIUM CHLORIDE 10 MEQ/100ML IV SOLN
10.0000 meq | INTRAVENOUS | Status: AC
  Administered 2018-07-21 (×6): 10 meq via INTRAVENOUS
  Filled 2018-07-21 (×6): qty 100

## 2018-07-21 NOTE — Progress Notes (Signed)
PT Cancellation Note  Patient Details Name: Brittany HaysBettye G Powers MRN: [id number] DOB: 10/01/1930   Cancelled Treatment:    Reason Eval/Treat Not Completed: PT screened, no needs identified, will sign off. Patient from SNF, long term, WC bound, required assist PTA.   Rada HayHill, Kati Riggenbach Elizabeth 07/21/2018, 8:19 AM  Blanchard KelchKaren Denee Boeder PT Acute Rehabilitation Services Pager [phone] Office [phone]

## 2018-07-21 NOTE — Progress Notes (Signed)
  Speech Language Pathology Treatment: Dysphagia  Patient Details Name: Brittany Powers MRN: [id number] DOB: 01/28/1931 Today's Date: 07/21/2018 Time: [id number] SLP Time Calculation (min) (ACUTE ONLY): 20 min  Assessment / Plan / Recommendation Clinical Impression  Spoke to family regarding pt's current dysphagia and to gather premorbid information, per pt's sister Ms Brittany Powers - pt has h/o chronic cough recently.  Diet had been changed to puree/thin at facility and pt noted to choke frequently on solids and liquids.     Reviewed prior MBS with family and concern given progression of difficulties that patient's swallow may not recover to allow adequate nutrition/hydration and will result in recurrent aspiration.  Potential for recurrent asp pnas, reviewed lack of benefit of feeding tubes in pt's over the age of 84 and opened conversation regarding possible comfort feeding.  Note palliative referral pending.    Will follow up with pt - educated family/pt to clinical reasoning for current diet recommendations and aspiration concerns.      HPI HPI: 84 yo female [facility] with AMS, concern for aspiration pna with concerns for afib.  PMH + for dementia, aphasia, resident of SNF and on puree diet prior to admit.  Swallow eval ordered.        SLP Plan  Continue with current plan of care       Recommendations  Diet recommendations: Honey-thick liquid(npo x honey thick liquids via tsp) Medication Administration: Via alternative means(if po medicine rec with tsp honey thick crushed)                Oral Care Recommendations: Oral care BID SLP Visit Diagnosis: Dysphagia, oropharyngeal phase (R13.12);Other (comment)(has known h/o esophageal deficits) Plan: Continue with current plan of care       GO                Brittany Powers, Brittany Powers 07/21/2018, 2:11 PM Brittany Burnetamara Tristen Luce, MS [facility] S[facility] Acute Rehab Services Pager [phone] Office [phone]

## 2018-07-21 NOTE — Evaluation (Signed)
Clinical/Bedside Swallow Evaluation Patient Details  Name: YECENIA DALGLEISH MRN: [id number] Date of Birth: [birth year]  Today's Date: 07/21/2018 Time: SLP Start Time (ACUTE ONLY): 1014 SLP Stop Time (ACUTE ONLY): 1037 SLP Time Calculation (min) (ACUTE ONLY): 23 min  Past Medical History:  Past Medical History:  Diagnosis Date  . COPD (chronic obstructive pulmonary disease) (HCC)   . CVA (cerebral infarction)   . Dementia (HCC)   . High cholesterol   . Hypertension   . Incontinent of urine    Past Surgical History:  Past Surgical History:  Procedure Laterality Date  . NO PAST SURGERIES     HPI:  84 yo female adm to [facility] with AMS, concern for aspiration pna with concerns for afib.  PMH + for dementia, aphasia, resident of SNF and on puree diet prior to admit.  Swallow eval ordered.     Assessment / Plan / Recommendation Clinical Impression  Pt currently overtly aspirated tsp of nectar thickened liquids c/b overt cough after swallow with face turning red and obvious discomfort.  Suspect this was due to premature spillage of boluses into open larynx.  Delayed swallow across all consistencies tested *nectar, honey, puree.  After aspiration episode, pt orally held all boluses without transiting up to 30 seconds.  SlP orally suctioned to remove.  Recommend keep npo x tsps of honey thick liquids prn.  Note palliaitve referral pending.  Will follow up for care plan after goals established.   SLP Visit Diagnosis: Dysphagia, oropharyngeal phase (R13.12);Other (comment)(has known h/o esophageal deficits)    Aspiration Risk       Diet Recommendation NPO;Honey-thick liquid(via tsp)   Liquid Administration via: Spoon Medication Administration: Via alternative means(if po, rec with tsp honey thick crushed)    Other  Recommendations Oral Care Recommendations: Oral care BID   Follow up Recommendations        Frequency and Duration     tbd       Prognosis Prognosis for Safe Diet  Advancement: Fair Barriers to Reach Goals: Severity of deficits      Swallow Study   General Date of Onset: 07/21/18 HPI: 84 yo female adm to [facility] with AMS, concern for aspiration pna with concerns for afib.  PMH + for dementia, aphasia, resident of SNF and on puree diet prior to admit.  Swallow eval ordered.   Type of Study: Bedside Swallow Evaluation Diet Prior to this Study: NPO Temperature Spikes Noted: No Respiratory Status: Room air History of Recent Intubation: No Behavior/Cognition: Alert;Cooperative;Pleasant mood Oral Care Completed by SLP: Yes Oral Cavity - Dentition: Edentulous Vision: Functional for self-feeding Self-Feeding Abilities: Total assist Patient Positioning: Upright in bed Baseline Vocal Quality: Other (comment);Not observed(pt nonverbal) Volitional Cough: Cognitively unable to elicit Volitional Swallow: Unable to elicit    Oral/Motor/Sensory Function Overall Oral Motor/Sensory Function: (? mild lingual deviation to right upon protrusion)   Ice Chips Ice chips: Impaired Oral Phase Impairments: Reduced lingual movement/coordination Oral Phase Functional Implications: Prolonged oral transit Pharyngeal Phase Impairments: Suspected delayed Swallow   Thin Liquid Thin Liquid: Not tested    Nectar Thick Nectar Thick Liquid: Impaired Presentation: Spoon Pharyngeal Phase Impairments: Cough - Immediate Other Comments: face turning red with cough and obvious discomfort, pt overtly aspirated likely due to premature spillage of bolus into airway, this appeared significantly discomforting for pt   Honey Thick Honey Thick Liquid: Impaired Presentation: Spoon Pharyngeal Phase Impairments: Suspected delayed Swallow   Puree Puree: Impaired Presentation: Spoon Oral Phase Functional Implications: Prolonged oral transit;Oral  holding Pharyngeal Phase Impairments: Suspected delayed Swallow Other Comments: slp orally suctioned boluses from pt's oral cavity x3 after she orally  held them - this occured after aspiration episode   Solid     Solid: Not tested Other Comments: due to pt's lack of dentition and poor oral manipulation      Chales AbrahamsKimball, Otniel Hoe Ann 07/21/2018,11:12 AM    Donavan Burnetamara Dryden Tapley, MS [facility] S[facility] Acute Rehab Services Pager [phone] Office [phone]

## 2018-07-21 NOTE — Progress Notes (Signed)
PROGRESS NOTE    Brittany Powers  NGE:[id number] DOB: 08/12/1931 DOA: 07/20/2018 PCP: Donn PieriniBowen-Pasfield, Sarah D, MD  Outpatient Specialists:   Brief Narrative: As per admitting H&P - "Brittany Powers is a 84 y.o. 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  Patient has baseline dementia and unable to provide a history.  Most of history is obtained from her 2 brothers and one sister, ED physician, and medical records.  Per her brother patient was in her normal state of health on Sunday yesterday.  At baseline patient is aphasic and more alert.  Early this morning patient was seen with emesis on her shirt, had a productive cough.  Was sent to the ED for further evaluation.  TRH asked to admit".  07/21/2018: Patient seen.  No significant history from the patient.  WBC is up at 32,300.  Low potassium of 3.1, troponin of 0.29 noted.  CT scan of the chest, abdomen and pelvis revealed "Consolidation lower lobes bilaterally with patchy ground-glass consolidation throughout the left upper lobe. Peribronchial thickening and narrowing. Findings suggestive of infection given history of fever and shaking. Subcarinal slightly prominent size lymph nodes probably related to infection.  Circumferential narrowing with suggestion of mild inflammation of the distal ascending colon and proximal transverse colon.  Small bowel extends into the inguinal canal bilaterally with greater extension into the left inguinal canal. Currently this does not appear to be causing obstruction. Aortic Atherosclerosis (ICD10-I70.0). Diffuse atherosclerotiplaque throughout all visualized arterial structures. Slight ectasia aortic arch/descending thoracic aortic junction and abdominal aorta without focal aneurysm. Narrowing of aortic branch vessels without large vessel occlusion".   Will consult palliative care team.  Continue current antibiotics.   Assessment & Plan:   Active Problems:   Sepsis (HCC)   Sepsis secondary to suspected aspiration pneumonia versus orders Start IV vancomycin and IV Zosyn Follow sputum culture Follow blood cultures x2 peripherally Duo nebs every 6 hours and every 2 hours as needed for shortness of breath and wheezing O2 supplementation to maintain O2 saturation greater than 92% CT chest, abdomen and pelvis findings as documented above. 07/21/2018: Discontinue IV vancomycin.  Nasal swab for MRSA came back negative.  Continue Zosyn.  Monitor renal function closely.    New onset A. fib RVR Twelve-lead EKG revealed rate in the 140s Lopressor 5 mg IV Q8h Possibly adrenergic driven. Low threshold to consider 2D echo and cardiology input.  Dehydration in the setting of dysphagia Patient on pured diet Keep n.p.o. for now until passes swallow evaluation Gentle IV fluid D5 half normal saline at 75 cc/h Monitor urine output Monitor electrolytes Monitor pulmonary status closely.  Vomiting with unclear etiology Reported by staff at SNF CT abdomen and pelvis with contrast result is noted. Continue to manage expectantly  History of CVA with aphasia and wheelchair-bound Fall precautions Not on aspirin or statin  Hypertension Uncontrolled, but improving Continue IV Lopressor 5 mg every 6 hours  Ambulatory dysfunction/physical debility PT OT to assess Fall precautions    DVT prophylaxis: Subcu to Lovenox  code Status: DO NOT RESUSCITATE Family Communication:  Disposition Plan: This will depend on hospital course   Consultants:   Palliative team  Procedures:   None  Antimicrobials:   IV Zosyn  Discontinued IV vancomycin   Subjective: Patient is aphasic, therefore, no history from patient.   Patient is also demented.  Objective: Vitals:   07/21/18 0118 07/21/18 0430 07/21/18 0500 07/21/18 0600  BP:     109/84  Pulse:    77  Resp:    (!) 26  Temp:  98.3 F (36.8 C)    TempSrc:  Axillary    SpO2: 99%   98%  Weight:   53.2 kg   Height:        Intake/Output Summary (Last 24 hours) at 07/21/2018 0740 Last data filed at 07/21/2018 0629 Gross per 24 hour  Intake 1622.49 ml  Output 150 ml  Net 1472.49 ml   Filed Weights   07/20/18 1304 07/20/18 1800 07/21/18 0500  Weight: 51.9 kg 53.2 kg 53.2 kg    Examination:  General exam: Appears calm and comfortable  Respiratory system: Clear to auscultation. Respiratory effort normal. Cardiovascular system: S1 & S2 heard. No pedal edema. Gastrointestinal system: Abdomen is nondistended, soft and nontender. No organomegaly or masses felt. Normal bowel sounds heard. Central nervous system: Awake and alert.  Patient is aphasic.  Patient seems to move all extremities. Extremities: No leg edema.     Data Reviewed: I have personally reviewed following labs and imaging studies  CBC: Recent Labs  Lab 07/20/18 1207 07/21/18 0328  WBC 22.5* 32.3*  NEUTROABS 21.4* 28.3*  HGB 14.6 12.6  HCT 45.3 38.6  MCV 100.4* 99.5  PLT 245 224   Basic Metabolic Panel: Recent Labs  Lab 07/20/18 1207 07/21/18 0328  NA 143 142  K 3.7 3.1*  CL 107 111  CO2 26 23  GLUCOSE 149* 156*  BUN 18 17  CREATININE 0.78 0.85  CALCIUM 9.3 8.4*  MG  --  1.7  PHOS  --  2.9   GFR: Estimated Creatinine Clearance: 38.3 mL/min (by C-G formula based on SCr of 0.85 mg/dL). Liver Function Tests: Recent Labs  Lab 07/20/18 1207 07/21/18 0328  AST 24 25  ALT 16 14  ALKPHOS 64 49  BILITOT 1.0 1.1  PROT 7.5 6.2*  ALBUMIN 3.9 3.1*   No results for input(s): LIPASE, AMYLASE in the last 168 hours. No results for input(s): AMMONIA in the last 168 hours. Coagulation Profile: No results for input(s): INR, PROTIME in the last 168 hours. Cardiac Enzymes: Recent Labs  Lab 07/20/18 1741 07/20/18 2153 07/21/18 0328  TROPONINI 0.08* 0.18* 0.29*   BNP (last 3  results) No results for input(s): PROBNP in the last 8760 hours. HbA1C: No results for input(s): HGBA1C in the last 72 hours. CBG: No results for input(s): GLUCAP in the last 168 hours. Lipid Profile: No results for input(s): CHOL, HDL, LDLCALC, TRIG, CHOLHDL, LDLDIRECT in the last 72 hours. Thyroid Function Tests: No results for input(s): TSH, T4TOTAL, FREET4, T3FREE, THYROIDAB in the last 72 hours. Anemia Panel: No results for input(s): VITAMINB12, FOLATE, FERRITIN, TIBC, IRON, RETICCTPCT in the last 72 hours. Urine analysis:    Component Value Date/Time   COLORURINE YELLOW 07/20/2018 1207   APPEARANCEUR CLEAR 07/20/2018 1207   LABSPEC 1.012 07/20/2018 1207   PHURINE 5.0 07/20/2018 1207   GLUCOSEU NEGATIVE 07/20/2018 1207   HGBUR NEGATIVE 07/20/2018 1207   BILIRUBINUR NEGATIVE 07/20/2018 1207   KETONESUR NEGATIVE 07/20/2018 1207   PROTEINUR NEGATIVE 07/20/2018 1207   NITRITE NEGATIVE 07/20/2018 1207   LEUKOCYTESUR NEGATIVE 07/20/2018 1207   Sepsis Labs: @LABRCNTIP (procalcitonin:4,lacticidven:4)  ) Recent Results (from the past 240 hour(s))  Blood Culture (routine x 2)     Status: None (Preliminary result)   Collection Time: 07/20/18 12:07 PM  Result Value Ref  Range Status   Specimen Description   Final    BLOOD BLOOD LEFT WRIST Performed at [facility], [address], [place], [place] [address]    Special Requests   Final    BOTTLES DRAWN AEROBIC AND ANAEROBIC Blood Culture results may not be optimal due to an excessive volume of blood received in culture bottles Performed at [facility], [address], [place], [place] [address]    Culture   Final    NO GROWTH < 24 HOURS Performed at [facility], [address] [place], [place] [address]    Report Status PENDING  Incomplete  Blood Culture (routine x 2)     Status: None (Preliminary result)   Collection Time: 07/20/18 12:12 PM  Result Value Ref Range Status    Specimen Description   Final    BLOOD LEFT WRIST Performed at [facility], [address] [place], [place] [address]    Special Requests   Final    BOTTLES DRAWN AEROBIC AND ANAEROBIC Blood Culture results may not be optimal due to an excessive volume of blood received in culture bottles Performed at [facility], [address], [place], [place] [address]    Culture   Final    NO GROWTH < 24 HOURS Performed at [facility], [address] [place], [place] [address]    Report Status PENDING  Incomplete  MRSA PCR Screening     Status: None   Collection Time: 07/20/18  5:31 PM  Result Value Ref Range Status   MRSA by PCR NEGATIVE NEGATIVE Final    Comment:        The GeneXpert MRSA Assay (FDA approved for NASAL specimens only), is one component of a comprehensive MRSA colonization surveillance program. It is not intended to diagnose MRSA infection nor to guide or monitor treatment for MRSA infections. Performed at [facility], [address], [place], [place] [address]   Respiratory Panel by PCR     Status: None   Collection Time: 07/20/18  6:10 PM  Result Value Ref Range Status   Adenovirus NOT DETECTED NOT DETECTED Final   Coronavirus 229E NOT DETECTED NOT DETECTED Final   Coronavirus HKU1 NOT DETECTED NOT DETECTED Final   Coronavirus NL63 NOT DETECTED NOT DETECTED Final   Coronavirus OC43 NOT DETECTED NOT DETECTED Final   Metapneumovirus NOT DETECTED NOT DETECTED Final   Rhinovirus / Enterovirus NOT DETECTED NOT DETECTED Final   Influenza A NOT DETECTED NOT DETECTED Final   Influenza B NOT DETECTED NOT DETECTED Final   Parainfluenza Virus 1 NOT DETECTED NOT DETECTED Final   Parainfluenza Virus 2 NOT DETECTED NOT DETECTED Final   Parainfluenza Virus 3 NOT DETECTED NOT DETECTED Final   Parainfluenza Virus 4 NOT DETECTED NOT DETECTED Final   Respiratory Syncytial Virus NOT DETECTED NOT DETECTED Final    Bordetella pertussis NOT DETECTED NOT DETECTED Final   Chlamydophila pneumoniae NOT DETECTED NOT DETECTED Final   Mycoplasma pneumoniae NOT DETECTED NOT DETECTED Final    Comment: Performed at [facility], [address] [place], [place] [address]         Radiology Studies: Ct Chest W Contrast  Result Date: 07/20/2018 CLINICAL DATA:  84 year old female with dementia presenting with shaking and fever. Subsequent encounter. EXAM: CT CHEST, ABDOMEN, AND PELVIS WITH CONTRAST TECHNIQUE: Multidetector CT imaging of the chest, abdomen and pelvis was performed following the standard protocol during  bolus administration of intravenous contrast. CONTRAST:  ISOVUE-300 IOPAMIDOL (ISOVUE-300) INJECTION 61% COMPARISON:  07/20/2018 chest x-ray. FINDINGS: CT CHEST FINDINGS Cardiovascular: Top-normal heart size with prominent coronary artery calcification. Atherosclerotic changes thoracic aorta with slight ectasia the junction the aortic arch and descending aorta. No aneurysm. No pulmonary embolus detected. Mediastinum/Nodes: Heterogeneous thyroid gland with substernal extension without dominant worrisome mass. Slightly prominent subcarinal lymph nodes which short axis dimension of 1.3 cm. Lungs/Pleura: Consolidation lower lobes bilaterally with patchy ground-glass consolidation throughout the left upper lobe. Peribronchial thickening and narrowing. Musculoskeletal: Mild kyphoscoliosis without compression fracture or osseous destructive lesion. Other: Calcified breast implants in place. Dense breast parenchyma with coarse calcifications. CT ABDOMEN PELVIS FINDINGS Hepatobiliary: Slightly lobulated contour of the liver without secondary findings of cirrhosis. Focal fatty infiltration adjacent to the falciform ligament. Dilated gallbladder without calcified gallstone. No CT evidence to suggest gallbladder inflammation. Prominent size common bile duct may be related to patient's age. No obstructing calcified  common bile duct stone or mass noted. Pancreas: No worrisome pancreatic mass or inflammation. Spleen: No splenic mass or enlargement. Adrenals/Urinary Tract: No obstructing stone or hydronephrosis. Left renal cysts. No adrenal mass. Noncontrast filled views the urinary bladder unremarkable. Stomach/Bowel: Small bowel extends into the inguinal canal bilaterally with greater extension left inguinal canal. Currently this does not appear to be causing obstruction. Circumferential narrowing with suggestion of mild inflammation distal ascending colon and proximal transverse colon. Vascular/Lymphatic: Atherosclerotic changes aorta and aortic branch vessels. Ectatic aorta without focal aneurysm. Narrowing branch vessels including iliac arteries without large vessel occlusion. Scattered normal size lymph nodes. Reproductive: No worrisome abnormality. Other: No free air. Musculoskeletal: Appearance of remote right hip fracture with degenerative changes. Degenerative changes lower lumbar spine. IMPRESSION: 1. Consolidation lower lobes bilaterally with patchy ground-glass consolidation throughout the left upper lobe. Peribronchial thickening and narrowing. Findings suggestive of infection given history of fever and shaking. 2. Subcarinal slightly prominent size lymph nodes probably related to infection. 3. Circumferential narrowing with suggestion of mild inflammation of the distal ascending colon and proximal transverse colon. 4. Small bowel extends into the inguinal canal bilaterally with greater extension into the left inguinal canal. Currently this does not appear to be causing obstruction. 5. Aortic Atherosclerosis (ICD10-I70.0). Diffuse atherosclerotic plaque throughout all visualized arterial structures. Slight ectasia aortic arch/descending thoracic aortic junction and abdominal aorta without focal aneurysm. Narrowing of aortic branch vessels without large vessel occlusion. Electronically Signed   By: Lacy Duverney M.D.    On: 07/20/2018 17:09   Ct Abdomen Pelvis W Contrast  Result Date: 07/20/2018 CLINICAL DATA:  84 year old female with dementia presenting with shaking and fever. Subsequent encounter. EXAM: CT CHEST, ABDOMEN, AND PELVIS WITH CONTRAST TECHNIQUE: Multidetector CT imaging of the chest, abdomen and pelvis was performed following the standard protocol during bolus administration of intravenous contrast. CONTRAST:  ISOVUE-300 IOPAMIDOL (ISOVUE-300) INJECTION 61% COMPARISON:  07/20/2018 chest x-ray. FINDINGS: CT CHEST FINDINGS Cardiovascular: Top-normal heart size with prominent coronary artery calcification. Atherosclerotic changes thoracic aorta with slight ectasia the junction the aortic arch and descending aorta. No aneurysm. No pulmonary embolus detected. Mediastinum/Nodes: Heterogeneous thyroid gland with substernal extension without dominant worrisome mass. Slightly prominent subcarinal lymph nodes which short axis dimension of 1.3 cm. Lungs/Pleura: Consolidation lower lobes bilaterally with patchy ground-glass consolidation throughout the left upper lobe. Peribronchial thickening and narrowing. Musculoskeletal: Mild kyphoscoliosis without compression fracture or osseous destructive lesion. Other: Calcified breast implants in place. Dense breast parenchyma with coarse calcifications. CT ABDOMEN PELVIS FINDINGS Hepatobiliary: Slightly lobulated contour  of the liver without secondary findings of cirrhosis. Focal fatty infiltration adjacent to the falciform ligament. Dilated gallbladder without calcified gallstone. No CT evidence to suggest gallbladder inflammation. Prominent size common bile duct may be related to patient's age. No obstructing calcified common bile duct stone or mass noted. Pancreas: No worrisome pancreatic mass or inflammation. Spleen: No splenic mass or enlargement. Adrenals/Urinary Tract: No obstructing stone or hydronephrosis. Left renal cysts. No adrenal mass. Noncontrast filled views  the urinary bladder unremarkable. Stomach/Bowel: Small bowel extends into the inguinal canal bilaterally with greater extension left inguinal canal. Currently this does not appear to be causing obstruction. Circumferential narrowing with suggestion of mild inflammation distal ascending colon and proximal transverse colon. Vascular/Lymphatic: Atherosclerotic changes aorta and aortic branch vessels. Ectatic aorta without focal aneurysm. Narrowing branch vessels including iliac arteries without large vessel occlusion. Scattered normal size lymph nodes. Reproductive: No worrisome abnormality. Other: No free air. Musculoskeletal: Appearance of remote right hip fracture with degenerative changes. Degenerative changes lower lumbar spine. IMPRESSION: 1. Consolidation lower lobes bilaterally with patchy ground-glass consolidation throughout the left upper lobe. Peribronchial thickening and narrowing. Findings suggestive of infection given history of fever and shaking. 2. Subcarinal slightly prominent size lymph nodes probably related to infection. 3. Circumferential narrowing with suggestion of mild inflammation of the distal ascending colon and proximal transverse colon. 4. Small bowel extends into the inguinal canal bilaterally with greater extension into the left inguinal canal. Currently this does not appear to be causing obstruction. 5. Aortic Atherosclerosis (ICD10-I70.0). Diffuse atherosclerotic plaque throughout all visualized arterial structures. Slight ectasia aortic arch/descending thoracic aortic junction and abdominal aorta without focal aneurysm. Narrowing of aortic branch vessels without large vessel occlusion. Electronically Signed   By: Lacy Duverney M.D.   On: 07/20/2018 17:09   Dg Chest Port 1 View  Result Date: 07/20/2018 CLINICAL DATA:  Fever and vomiting. EXAM: PORTABLE CHEST 1 VIEW COMPARISON:  Two-view chest x-ray 04/12/2016 FINDINGS: The heart size is normal. Aortic atherosclerosis is present.  There is no edema or effusion. A moderate-sized hiatal hernia is present. No focal airspace disease is present. Degenerative changes of the thoracic spine are again noted. IMPRESSION: 1. No acute cardiopulmonary disease. 2. Aortic atherosclerosis. 3. Moderate-sized hiatal hernia. Electronically Signed   By: Marin Roberts M.D.   On: 07/20/2018 14:08        Scheduled Meds: . enoxaparin (LOVENOX) injection  40 mg Subcutaneous Q24H  . ipratropium-albuterol  3 mL Nebulization Q6H  . mouth rinse  15 mL Mouth Rinse BID  . metoprolol tartrate  5 mg Intravenous Q8H  . sodium chloride HYPERTONIC  4 mL Nebulization TID   Continuous Infusions: . dextrose 5 % and 0.45% NaCl 75 mL/hr at 07/21/18 0100  . piperacillin-tazobactam (ZOSYN)  IV 3.375 g (07/21/18 0629)  . potassium chloride    . vancomycin       LOS: 1 day    Time spent: 35 minutes    Berton Mount, MD  [facility] Pager #: [phone] 7PM-7AM contact night coverage as above

## 2018-07-21 NOTE — Progress Notes (Signed)
Dr Dartha Lodgegbata notifed of lactic acid 2.6.

## 2018-07-22 ENCOUNTER — Other Ambulatory Visit (HOSPITAL_COMMUNITY): Payer: Medicare Other

## 2018-07-22 DIAGNOSIS — A419 Sepsis, unspecified organism: Principal | ICD-10-CM

## 2018-07-22 DIAGNOSIS — J690 Pneumonitis due to inhalation of food and vomit: Secondary | ICD-10-CM

## 2018-07-22 DIAGNOSIS — Z515 Encounter for palliative care: Secondary | ICD-10-CM

## 2018-07-22 DIAGNOSIS — R1310 Dysphagia, unspecified: Secondary | ICD-10-CM

## 2018-07-22 DIAGNOSIS — Z7189 Other specified counseling: Secondary | ICD-10-CM

## 2018-07-22 DIAGNOSIS — I4891 Unspecified atrial fibrillation: Secondary | ICD-10-CM

## 2018-07-22 LAB — CBC WITH DIFFERENTIAL/PLATELET
Abs Immature Granulocytes: 0.11 10*3/uL — ABNORMAL HIGH (ref 0.00–0.07)
Basophils Absolute: 0.1 10*3/uL (ref 0.0–0.1)
Basophils Relative: 0 %
Eosinophils Absolute: 0.1 10*3/uL (ref 0.0–0.5)
Eosinophils Relative: 1 %
HCT: 37 % (ref 36.0–46.0)
Hemoglobin: 11.8 g/dL — ABNORMAL LOW (ref 12.0–15.0)
Immature Granulocytes: 1 %
Lymphocytes Relative: 11 %
Lymphs Abs: 1.9 10*3/uL (ref 0.7–4.0)
MCH: 32.5 pg (ref 26.0–34.0)
MCHC: 31.9 g/dL (ref 30.0–36.0)
MCV: 101.9 fL — ABNORMAL HIGH (ref 80.0–100.0)
Monocytes Absolute: 0.6 10*3/uL (ref 0.1–1.0)
Monocytes Relative: 3 %
Neutro Abs: 14.4 10*3/uL — ABNORMAL HIGH (ref 1.7–7.7)
Neutrophils Relative %: 84 %
Platelets: 201 10*3/uL (ref 150–400)
RBC: 3.63 MIL/uL — ABNORMAL LOW (ref 3.87–5.11)
RDW: 13 % (ref 11.5–15.5)
WBC: 17.2 10*3/uL — ABNORMAL HIGH (ref 4.0–10.5)
nRBC: 0 % (ref 0.0–0.2)

## 2018-07-22 LAB — RENAL FUNCTION PANEL
Albumin: 2.7 g/dL — ABNORMAL LOW (ref 3.5–5.0)
Anion gap: 8 (ref 5–15)
BUN: 10 mg/dL (ref 8–23)
CALCIUM: 8.4 mg/dL — AB (ref 8.9–10.3)
CO2: 20 mmol/L — ABNORMAL LOW (ref 22–32)
CREATININE: 0.84 mg/dL (ref 0.44–1.00)
Chloride: 113 mmol/L — ABNORMAL HIGH (ref 98–111)
GFR calc Af Amer: >60 mL/min (ref >60)
GLUCOSE: 143 mg/dL — AB (ref 70–99)
PHOSPHORUS: 2.1 mg/dL — AB (ref 2.5–4.6)
POTASSIUM: 3.5 mmol/L (ref 3.5–5.1)
Sodium: 141 mmol/L (ref 135–145)

## 2018-07-22 LAB — GLUCOSE, CAPILLARY
GLUCOSE-CAPILLARY: 134 mg/dL — AB (ref 70–99)
GLUCOSE-CAPILLARY: 110 mg/dL — AB (ref 70–99)
GLUCOSE-CAPILLARY: 109 mg/dL — AB (ref 70–99)
Glucose-Capillary: 118 mg/dL — ABNORMAL HIGH (ref 70–99)
Glucose-Capillary: 118 mg/dL — ABNORMAL HIGH (ref 70–99)
Glucose-Capillary: 100 mg/dL — ABNORMAL HIGH (ref 70–99)

## 2018-07-22 LAB — CREATININE, SERUM: Creatinine, Ser: 0.76 mg/dL (ref 0.44–1.00)

## 2018-07-22 LAB — MAGNESIUM: Magnesium: 1.7 mg/dL (ref 1.7–2.4)

## 2018-07-22 LAB — TSH: TSH: 0.093 u[IU]/mL — ABNORMAL LOW (ref 0.350–4.500)

## 2018-07-22 MED ORDER — POTASSIUM CHLORIDE IN NACL 40-0.9 MEQ/L-% IV SOLN
INTRAVENOUS | Status: DC
  Administered 2018-07-22 – 2018-07-24 (×5): 60 mL/h via INTRAVENOUS
  Filled 2018-07-22 (×4): qty 1000

## 2018-07-22 MED ORDER — IPRATROPIUM-ALBUTEROL 0.5-2.5 (3) MG/3ML IN SOLN
3.0000 mL | Freq: Three times a day (TID) | RESPIRATORY_TRACT | Status: DC
  Administered 2018-07-22 – 2018-07-23 (×2): 3 mL via RESPIRATORY_TRACT
  Filled 2018-07-22 (×2): qty 3

## 2018-07-22 MED ORDER — SODIUM CHLORIDE 0.9 % IV SOLN: INTRAVENOUS | Status: DC

## 2018-07-22 MED ORDER — POTASSIUM PHOSPHATES 15 MMOLE/5ML IV SOLN
15.0000 mmol | Freq: Once | INTRAVENOUS | Status: AC
  Administered 2018-07-22: 15 mmol via INTRAVENOUS
  Filled 2018-07-22: qty 5

## 2018-07-22 NOTE — Progress Notes (Signed)
  Speech Language Pathology Treatment: Dysphagia  Patient Details Name: Brittany Powers MRN: [id number] DOB: 20-Mar-1931 Today's Date: 07/22/2018 Time: 3785-8850 SLP Time Calculation (min) (ACUTE ONLY): 15 min  Assessment / Plan / Recommendation Clinical Impression  Pt was awake, nonvocal. ST met with family at bedside as follow up to SLP education completed yesterday. Family reports Palliative Care met with family today, however, no goals of care or decisions have been made at this time. SLP provided the opportunity to ask questions regarding pt swallow safety, aspiration risk, and recommendations from BSE. Family had none at this time. SLP will follow up once goals of care have been established. Family voiced understanding and agreement.   HPI HPI: 84 yo female adm to [facility] with AMS, concern for aspiration pna with concerns for afib.  PMH + for dementia, aphasia, resident of SNF and on puree diet prior to admit.  Swallow eval ordered.        SLP Plan  Continue with current plan of care       Recommendations  Diet recommendations: Honey-thick liquid Medication Administration: Via alternative means                Oral Care Recommendations: Oral care BID SLP Visit Diagnosis: Dysphagia, oropharyngeal phase (R13.12);Other (comment) Plan: Continue with current plan of care       [place]. Quentin Ore [facility], CCC-SLP Speech Language Pathologist [phone]  Shonna Chock 07/22/2018, 1:43 PM

## 2018-07-22 NOTE — Care Management Note (Signed)
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 Patient has baseline dementia and unable to provide a history. Most of history is obtained from her 2 brothers and one sister, ED physician, and medical records. Per her brother patient was in her normal state of health on Sunday yesterday. At baseline patient is aphasic and more alert. Early this morning patient was seen with emesis on her shirt, had a productive cough. WBC is up at 32,300.  Low potassium of 3.1, troponin of 0.29 noted.  CT scan of the chest, abdomen and pelvis revealed "Consolidation lower lobes bilaterally with patchy ground-glass consolidation throughout the left upper lobe. Peribronchial thickening and narrowing. Findings suggestive of infection given history of fever and shaking. Subcarinal slightly prominent size lymph nodes probably related to infection.  Circumferential narrowing with suggestion of mild inflammation of the distal ascending colon and proximal transverse colon.  Small bowel extends into the inguinal canal bilaterally with greater extension into the left inguinal canal. Currently this does not appear to be causing obstruction. Aortic Atherosclerosis (ICD10-I70.0). Diffuse atherosclerotiplaque throughout all visualized arterial structures. Slight ectasia aortic arch/descending thoracic aortic junction and abdominal aorta without focal aneurysm. Narrowing of aortic branch vessels without large vessel occlusion".  Will consult palliative care team.  Continue current antibiotics Action/Plan: [facility] Following for progression of care. Following for cm needs, none present at this time, no  discharge plans at this time. Expected Discharge Date:  (unknown)               Expected Discharge Plan:  Skilled Nursing Facility  In-House Referral:  Clinical Social Work  Discharge planning Services  CM Consult  Post Acute Care Choice:    Choice offered to:     DME Arranged:    DME Agency:     HH Arranged:    HH Agency:     Status of Service:  In process, will continue to follow  If discussed at Long Length of Stay Meetings, dates discussed:    Additional Comments:  Golda AcreDavis, Slyvester Latona Lynn, RN 07/22/2018, 9:31 AM

## 2018-07-22 NOTE — Care Management Note (Signed)
Case Management Note  Patient Details  Name: Brittany HaysBettye G Ryker MRN: [id number] Date of Birth: 04/10/1931  Subjective/Objective:                  Discharged Planning  Action/Plan: Spoke with the family.  Does not want [facility] to [facility] or a snf with hospice services/Dr. Linna Darnernwar is seeing from palliative care and will do referral to csw for possible new placement.  Expected Discharge Date:  (unknown)               Expected Discharge Plan:  Skilled Nursing Facility  In-House Referral:  Clinical Social Work  Discharge planning Services  CM Consult  Post Acute Care Choice:    Choice offered to:     DME Arranged:    DME Agency:     HH Arranged:    HH Agency:     Status of Service:  In process, will continue to follow  If discussed at Long Length of Stay Meetings, dates discussed:    Additional Comments:  Golda AcreDavis, Chieko Neises Lynn, RN 07/22/2018, 2:03 PM

## 2018-07-22 NOTE — Progress Notes (Addendum)
PROGRESS NOTE   Brittany Powers  TGG:[id number]    DOB: [birth year]    DOA: 07/20/2018  PCP: Dione Housekeeper, MD   I have briefly reviewed patients previous medical records in [facility].  Brief Narrative:  84 year old female, SNF resident, history of CVA, nonambulatory/wheelchair bound and aphasic at baseline, dysphagia on pured diet dementia, frequent falls, COPD, HLD, HTN, presented to [facility] ED on 07/20/2018 due to productive cough, fever and vomiting.  She was admitted for sepsis suspected due to aspiration pneumonia from emesis and dysphagia, new onset A. fib with RVR.  Admitted to stepdown unit.  PMT consulted.   Assessment & Plan:   Active Problems:   Sepsis ([place])   Palliative care by specialist   Goals of care, counseling/discussion   Sepsis due to suspected aspiration pneumonia: Likely due to nonbloody emesis PTA complicating chronic dysphagia.  Met sepsis criteria on admission.  CT chest with contrast 11/25: Consolidation lower lobes bilaterally with patchy groundglass consolidation throughout the left upper lobe.  Prominent had been empirically started on IV vancomycin and Zosyn.  MRSA PCR negative.  Vancomycin discontinued.  Blood cultures x2: Negative to date.  Urine culture negative.  Sepsis physiology resolved.  Leukocytosis improving.  RSV panel negative.  Aspiration pneumonia: Management as indicated above, at risk for recurrent aspiration related to advanced dementia and chronic dysphagia.  Dysphagia: Speech therapy input appreciated and recommend honey thickened liquids.  Nausea and vomiting: Unclear etiology.  CT abdomen 07/20/2018: Reported circumferential narrowing with suggestion of mild inflammation of distal ascending colon and proximal transverse colon and no SBO.  No further nausea or vomiting reported.  Self-limited illness.  Monitor.  New onset A. fib with RVR: Suspect precipitated by sepsis.  Currently on scheduled IV metoprolol  5 mg every 8 hourly while oral intake is inconsistent.  Had another episode of RVR overnight which reverted to sinus rhythm.  Check TSH.  Check TTE.  Not a candidate for long-term anticoagulation given advanced age, advanced dementia and comorbidities.  Dehydration: Limited oral intake due to mental status changes and dysphagia.  For now continue gentle IV fluid hydration.  History of CVA: Nonambulatory/wheelchair bound, aphasia, dysphagia and suspected vascular dementia.  Not on aspirin or statins PTA.  Essential hypertension: Controlled.  Hypokalemia: Better, replace and follow as needed.  Magnesium 1.7.  Hypophosphatemia: Replace and follow.  Macrocytic anemia: Some of it has dilutional component.  No bleeding reported.  Follow CBC in a.m.  Leukocytosis: Secondary to sepsis.  WBC down from 32-17.  Follow CBC in a.m.  Elevated troponin: No chest pain reported but patient aphasic.  May be from demand ischemia related to sepsis, intermittent A. fib with RVR.  Check TTE.  No aggressive evaluation due to advanced age and multiple severe significant irreversible comorbidities.  Adult failure to thrive: PMT input 11/27 appreciated.  DNR and DNI were confirmed with family at bedside, comfort feeds/honey thickened liquids continued, ongoing discussion regarding discharge to residential hospice versus SNF [facility] with hospice.   DVT prophylaxis: Lovenox Code Status: DNR Family Communication: None at bedside. Left VM message for sister/HCPOA. Disposition: TBD pending further hospital course and GOC outcome.   Consultants:  PMT  Procedures:  None  Antimicrobials:  As above   Subjective: Patient is nonverbal.  As per discussion with RN at bedside, patient was back in A. fib with RVR overnight and reverted to SR/SB after IV metoprolol and a dose of digoxin.  No other acute issues reported.  ROS: Unable due to mental status changes.  Objective:  Vitals:   07/22/18 0900  07/22/18 1000 07/22/18 1100 07/22/18 1200  BP:    (!) 156/71  Pulse: 67 63 64 63  Resp: _0 (!) 21  Temp:    98.3 F (36.8 C)  TempSrc:    Oral  SpO2: 99% 100% 100% 99%  Weight:      Height:        Examination:  General exam: Elderly female, moderately built, frail, chronically ill looking, lying propped up in bed and tracking activity who around her with her eyes but nonverbal.  Does not follow any instructions. Respiratory system: Poor inspiratory effort.  Few basal crackles.  Rest of lung fields seem clear to auscultation. Respiratory effort normal. Cardiovascular system: S1 & S2 heard, RRR. No JVD, murmurs, rubs, gallops or clicks. No pedal edema.  Telemetry personally reviewed: Had A. fib with RVR overnight, now in sinus rhythm. Gastrointestinal system: Abdomen is nondistended, soft and nontender. No organomegaly or masses felt. Normal bowel sounds heard. Central nervous system: Alert but not oriented and does not follow instructions. No focal neurological deficits. Extremities: Not seen moving any limbs.?  Contractures of lower extremities. Skin: No rashes, lesions or ulcers Psychiatry: Judgement and insight impaired. Mood & affect flat.     Data Reviewed: I have personally reviewed following labs and imaging studies  CBC: Recent Labs  Lab 07/20/18 1207 07/21/18 0328 07/22/18 0324  WBC 22.5* 32.3* 17.2*  NEUTROABS 21.4* 28.3* 14.4*  HGB 14.6 12.6 11.8*  HCT 45.3 38.6 37.0  MCV 100.4* 99.5 101.9*  PLT 245 224 716   Basic Metabolic Panel: Recent Labs  Lab 07/20/18 1207 07/21/18 0328 07/22/18 0324  NA 143 142 141  K 3.7 3.1* 3.5  CL 107 111 113*  CO2 26 23 20*  GLUCOSE 149* 156* 143*  BUN _1 CREATININE 0.78 0.85 0.76  0.84  CALCIUM 9.3 8.4* 8.4*  MG  --  1.7 1.7  PHOS  --  2.9 2.1*   Liver Function Tests: Recent Labs  Lab 07/20/18 1207 07/21/18 0328 07/22/18 0324  AST 24 25  --   ALT 16 14  --   ALKPHOS 64 49  --   BILITOT 1.0 1.1  --    PROT 7.5 6.2*  --   ALBUMIN 3.9 3.1* 2.7*   Cardiac Enzymes: Recent Labs  Lab 07/20/18 1741 07/20/18 2153 07/21/18 0328 07/21/18 0830  TROPONINI 0.08* 0.18* 0.29* 0.25*   CBG: Recent Labs  Lab 07/21/18 1931 07/21/18 2302 07/22/18 0349 07/22/18 0726 07/22/18 1157  GLUCAP 135* 148* 134* 118* 118*    Recent Results (from the past 240 hour(s))  Blood Culture (routine x 2)     Status: None (Preliminary result)   Collection Time: 07/20/18 12:07 PM  Result Value Ref Range Status   Specimen Description   Final    BLOOD BLOOD LEFT WRIST Performed at [facility] [address] [place], [place] [address]    Special Requests   Final    BOTTLES DRAWN AEROBIC AND ANAEROBIC Blood Culture results may not be optimal due to an excessive volume of blood received in culture bottles Performed at [facility] [address], [place], [place] [address]    Culture   Final    NO GROWTH 2 DAYS Performed at [facility] [address], [place], [place] [address]    Report Status PENDING  Incomplete  Urine culture     Status: None   Collection Time: 07/20/18 12:07 PM  Result Value Ref Range Status   Specimen Description   Final    URINE, CATHETERIZED Performed at [facility], [place] [address] [place], [place] [address]    Special Requests   Final    Normal Performed at [facility], [place] [address] [place], [place] [address]    Culture   Final    NO GROWTH Performed at [facility], [place] [address] [place], [place] [address]    Report Status 07/21/2018 FINAL  Final  Blood Culture (routine x 2)     Status: None (Preliminary result)   Collection Time: 07/20/18 12:12 PM  Result Value Ref Range Status   Specimen Description   Final    BLOOD LEFT WRIST Performed at [facility] [address] [place], [place] [address]    Special Requests   Final    BOTTLES DRAWN AEROBIC  AND ANAEROBIC Blood Culture results may not be optimal due to an excessive volume of blood received in culture bottles Performed at [facility] [address], [place], [place] [address]    Culture   Final    NO GROWTH 2 DAYS Performed at [facility] [address] [place], [place] [address]    Report Status PENDING  Incomplete  MRSA PCR Screening     Status: None   Collection Time: 07/20/18  5:31 PM  Result Value Ref Range Status   MRSA by PCR NEGATIVE NEGATIVE Final    Comment:        The GeneXpert MRSA Assay (FDA approved for NASAL specimens only), is one component of a comprehensive MRSA colonization surveillance program. It is not intended to diagnose MRSA infection nor to guide or monitor treatment for MRSA infections. Performed at [facility], [place] [address], [place], [place] [address]   Respiratory Panel by PCR     Status: None   Collection Time: 07/20/18  6:10 PM  Result Value Ref Range Status   Adenovirus NOT DETECTED NOT DETECTED Final   Coronavirus 229E NOT DETECTED NOT DETECTED Final   Coronavirus HKU1 NOT DETECTED NOT DETECTED Final   Coronavirus NL63 NOT DETECTED NOT DETECTED Final   Coronavirus OC43 NOT DETECTED NOT DETECTED Final   Metapneumovirus NOT DETECTED NOT DETECTED Final   Rhinovirus / Enterovirus NOT DETECTED NOT DETECTED Final   Influenza A NOT DETECTED NOT DETECTED Final   Influenza B NOT DETECTED NOT DETECTED Final   Parainfluenza Virus 1 NOT DETECTED NOT DETECTED Final   Parainfluenza Virus 2 NOT DETECTED NOT DETECTED Final   Parainfluenza Virus 3 NOT DETECTED NOT DETECTED Final   Parainfluenza Virus 4 NOT DETECTED NOT DETECTED Final   Respiratory Syncytial Virus NOT DETECTED NOT DETECTED Final   Bordetella pertussis NOT DETECTED NOT DETECTED Final   Chlamydophila pneumoniae NOT DETECTED NOT DETECTED Final   Mycoplasma pneumoniae NOT DETECTED NOT DETECTED Final    Comment: Performed at [facility], [place] [address], [place], [place] [address]         Radiology Studies: Ct Chest W Contrast  Result Date: 07/20/2018 CLINICAL DATA:  84 year old female with dementia presenting with shaking and fever. Subsequent encounter. EXAM: CT CHEST, ABDOMEN, AND PELVIS WITH CONTRAST TECHNIQUE: Multidetector CT imaging of the chest, abdomen and pelvis was performed following the standard protocol during bolus administration of intravenous contrast. CONTRAST:  148m  ISOVUE-300 IOPAMIDOL (ISOVUE-300) INJECTION 61% COMPARISON:  07/20/2018 chest x-ray. FINDINGS: CT CHEST FINDINGS Cardiovascular: Top-normal heart size with prominent coronary artery calcification. Atherosclerotic changes thoracic aorta with slight ectasia the junction the aortic arch and descending aorta. No aneurysm. No pulmonary embolus detected. Mediastinum/Nodes: Heterogeneous thyroid gland with substernal extension without dominant worrisome mass. Slightly prominent subcarinal lymph nodes which short axis dimension of 1.3 cm. Lungs/Pleura: Consolidation lower lobes bilaterally with patchy ground-glass consolidation throughout the left upper lobe. Peribronchial thickening and narrowing. Musculoskeletal: Mild kyphoscoliosis without compression fracture or osseous destructive lesion. Other: Calcified breast implants in place. Dense breast parenchyma with coarse calcifications. CT ABDOMEN PELVIS FINDINGS Hepatobiliary: Slightly lobulated contour of the liver without secondary findings of cirrhosis. Focal fatty infiltration adjacent to the falciform ligament. Dilated gallbladder without calcified gallstone. No CT evidence to suggest gallbladder inflammation. Prominent size common bile duct may be related to patient's age. No obstructing calcified common bile duct stone or mass noted. Pancreas: No worrisome pancreatic mass or inflammation. Spleen: No splenic mass or enlargement. Adrenals/Urinary Tract: No obstructing stone or hydronephrosis.  Left renal cysts. No adrenal mass. Noncontrast filled views the urinary bladder unremarkable. Stomach/Bowel: Small bowel extends into the inguinal canal bilaterally with greater extension left inguinal canal. Currently this does not appear to be causing obstruction. Circumferential narrowing with suggestion of mild inflammation distal ascending colon and proximal transverse colon. Vascular/Lymphatic: Atherosclerotic changes aorta and aortic branch vessels. Ectatic aorta without focal aneurysm. Narrowing branch vessels including iliac arteries without large vessel occlusion. Scattered normal size lymph nodes. Reproductive: No worrisome abnormality. Other: No free air. Musculoskeletal: Appearance of remote right hip fracture with degenerative changes. Degenerative changes lower lumbar spine. IMPRESSION: 1. Consolidation lower lobes bilaterally with patchy ground-glass consolidation throughout the left upper lobe. Peribronchial thickening and narrowing. Findings suggestive of infection given history of fever and shaking. 2. Subcarinal slightly prominent size lymph nodes probably related to infection. 3. Circumferential narrowing with suggestion of mild inflammation of the distal ascending colon and proximal transverse colon. 4. Small bowel extends into the inguinal canal bilaterally with greater extension into the left inguinal canal. Currently this does not appear to be causing obstruction. 5. Aortic Atherosclerosis (ICD10-I70.0). Diffuse atherosclerotic plaque throughout all visualized arterial structures. Slight ectasia aortic arch/descending thoracic aortic junction and abdominal aorta without focal aneurysm. Narrowing of aortic branch vessels without large vessel occlusion. Electronically Signed   By: Genia Del M.D.   On: 07/20/2018 17:09   Ct Abdomen Pelvis W Contrast  Result Date: 07/20/2018 CLINICAL DATA:  84 year old female with dementia presenting with shaking and fever. Subsequent encounter. EXAM:  CT CHEST, ABDOMEN, AND PELVIS WITH CONTRAST TECHNIQUE: Multidetector CT imaging of the chest, abdomen and pelvis was performed following the standard protocol during bolus administration of intravenous contrast. CONTRAST:  174m ISOVUE-300 IOPAMIDOL (ISOVUE-300) INJECTION 61% COMPARISON:  07/20/2018 chest x-ray. FINDINGS: CT CHEST FINDINGS Cardiovascular: Top-normal heart size with prominent coronary artery calcification. Atherosclerotic changes thoracic aorta with slight ectasia the junction the aortic arch and descending aorta. No aneurysm. No pulmonary embolus detected. Mediastinum/Nodes: Heterogeneous thyroid gland with substernal extension without dominant worrisome mass. Slightly prominent subcarinal lymph nodes which short axis dimension of 1.3 cm. Lungs/Pleura: Consolidation lower lobes bilaterally with patchy ground-glass consolidation throughout the left upper lobe. Peribronchial thickening and narrowing. Musculoskeletal: Mild kyphoscoliosis without compression fracture or osseous destructive lesion. Other: Calcified breast implants in place. Dense breast parenchyma with coarse calcifications. CT ABDOMEN PELVIS FINDINGS Hepatobiliary: Slightly lobulated contour of the liver without secondary findings of cirrhosis.  Focal fatty infiltration adjacent to the falciform ligament. Dilated gallbladder without calcified gallstone. No CT evidence to suggest gallbladder inflammation. Prominent size common bile duct may be related to patient's age. No obstructing calcified common bile duct stone or mass noted. Pancreas: No worrisome pancreatic mass or inflammation. Spleen: No splenic mass or enlargement. Adrenals/Urinary Tract: No obstructing stone or hydronephrosis. Left renal cysts. No adrenal mass. Noncontrast filled views the urinary bladder unremarkable. Stomach/Bowel: Small bowel extends into the inguinal canal bilaterally with greater extension left inguinal canal. Currently this does not appear to be causing  obstruction. Circumferential narrowing with suggestion of mild inflammation distal ascending colon and proximal transverse colon. Vascular/Lymphatic: Atherosclerotic changes aorta and aortic branch vessels. Ectatic aorta without focal aneurysm. Narrowing branch vessels including iliac arteries without large vessel occlusion. Scattered normal size lymph nodes. Reproductive: No worrisome abnormality. Other: No free air. Musculoskeletal: Appearance of remote right hip fracture with degenerative changes. Degenerative changes lower lumbar spine. IMPRESSION: 1. Consolidation lower lobes bilaterally with patchy ground-glass consolidation throughout the left upper lobe. Peribronchial thickening and narrowing. Findings suggestive of infection given history of fever and shaking. 2. Subcarinal slightly prominent size lymph nodes probably related to infection. 3. Circumferential narrowing with suggestion of mild inflammation of the distal ascending colon and proximal transverse colon. 4. Small bowel extends into the inguinal canal bilaterally with greater extension into the left inguinal canal. Currently this does not appear to be causing obstruction. 5. Aortic Atherosclerosis (ICD10-I70.0). Diffuse atherosclerotic plaque throughout all visualized arterial structures. Slight ectasia aortic arch/descending thoracic aortic junction and abdominal aorta without focal aneurysm. Narrowing of aortic branch vessels without large vessel occlusion. Electronically Signed   By: Genia Del M.D.   On: 07/20/2018 17:09        Scheduled Meds: . enoxaparin (LOVENOX) injection  40 mg Subcutaneous Q24H  . ipratropium-albuterol  3 mL Nebulization TID  . mouth rinse  15 mL Mouth Rinse BID  . metoprolol tartrate  5 mg Intravenous Q8H  . sodium chloride HYPERTONIC  4 mL Nebulization TID   Continuous Infusions: . sodium chloride 250 mL (07/22/18 1235)  . dextrose 5 % and 0.45% NaCl 75 mL/hr at 07/22/18 1232  .  piperacillin-tazobactam (ZOSYN)  IV Stopped (07/22/18 0931)     LOS: 2 days     Vernell Leep, MD, FACP, [facility]. [facility] Pager [phone]  If 7PM-7AM, please contact night-coverage [email or web address] Password [facility] 07/22/2018, 2:49 PM

## 2018-07-22 NOTE — Consult Note (Signed)
Consultation Note Date: 07/22/2018   Patient Name: Brittany Powers  DOB: [birth year]  MRN: [id number]  Age / Sex: 84 y.o., female  PCP: Donn Pierini, MD Referring Physician: Elease Etienne, MD  Reason for Consultation: Establishing goals of care  HPI/Patient Profile: 84 y.o. female admitted on 07/20/2018     Clinical Assessment and Goals of Care:  84 year old lady who lives at [facility] nursing facility for the last 3-1/2 years. She has a history of strokes and dementia and dysphagia. Her baseline is such that she is wheelchair-bound and a phasic.  Patient has ongoing risk of aspiration. She has had a few recurrent hospitalizations because of falls and aspiration pneumonia.  The patient has been admitted from her nursing facility and is undergoing treatment for aspiration pneumonia. She has undergone an extensive speech-language pathology evaluation. She is overtly aspirating. The final recommendations are for nothing by mouth and honey thick liquids.  A palliative consultation has been requested for goals of care discussions. The patient is awake and alert, resting in bed. She does not verbalize which is her baseline. She is bed bound which is her baseline. Her sister and brother are present at the bedside. I introduced myself and palliative care as follows: Palliative medicine is specialized medical care for people living with serious illness. It focuses on providing relief from the symptoms and stress of a serious illness. The goal is to improve quality of life for both the patient and the family.  Patient's sister states that the patient has been eating pured food for the past several months. She has had gradual progressive decline. We discussed about the patient's underlying multiple conditions such as dementia, history of stroke, high risk of ongoing aspiration events. Discussed in  detail about etiology of aspiration pneumonia. Goals wishes and values attempted to be discussed.   Please note additional discussions/recommendations as listed below. Thank you for the consult.  NEXT OF KIN  sister Brittany Powers who is decision maker She has 2 brothers She has no spouse, no kids.    SUMMARY OF RECOMMENDATIONS    1. DNR DNI re discussed and re confirmed with sister and brother at bedside. Sister Brittany Powers is Management consultant.  2. Comfort feeds: for right now, only on honey thick liquids. To continue the same.  3. Yankauer suction to be used PRN.  4. Disposition: long discussion with patient's sister Brittany Powers and brother at bedside regarding hospice philosophy of care and the type of care that can be provided in a residential hospice. Patient with high risk for aspiration, no meaningful PO intake, frailty, deconditioning, bed bound status. We discussed about residential hospice versus back to [facility] with hospice. CSW consulted to help continue this discussion forward.  Thank you for the consult.   Code Status/Advance Care Planning:  DNR    Symptom Management:    as above   Palliative Prophylaxis:   Bowel Regimen   Psycho-social/Spiritual:   Desire for further Chaplaincy support:yes  Additional Recommendations: Education on Hospice  Prognosis:   <  2 weeks  Discharge Planning: Hospice facility      Primary Diagnoses: Present on Admission: . Sepsis (HCC)   I have reviewed the medical record, interviewed the patient and family, and examined the patient. The following aspects are pertinent.  Past Medical History:  Diagnosis Date  . COPD (chronic obstructive pulmonary disease) (HCC)   . CVA (cerebral infarction)   . Dementia (HCC)   . High cholesterol   . Hypertension   . Incontinent of urine    Social History   Socioeconomic History  . Marital status: Divorced    Spouse name: Not on file  . Number of children: Not on file  . Years of education:  Not on file  . Highest education level: Not on file  Occupational History  . Not on file  Social Needs  . Financial resource strain: Not on file  . Food insecurity:    Worry: Not on file    Inability: Not on file  . Transportation needs:    Medical: Not on file    Non-medical: Not on file  Tobacco Use  . Smoking status: Former Smoker    Types: Cigarettes    Last attempt to quit: 12/26/2014    Years since quitting: 3.5  . Smokeless tobacco: Never Used  . Tobacco comment: " smoked most of her life "  Substance and Sexual Activity  . Alcohol use: No  . Drug use: No  . Sexual activity: Never  Lifestyle  . Physical activity:    Days per week: Not on file    Minutes per session: Not on file  . Stress: Not on file  Relationships  . Social connections:    Talks on phone: Not on file    Gets together: Not on file    Attends religious service: Not on file    Active member of club or organization: Not on file    Attends meetings of clubs or organizations: Not on file    Relationship status: Not on file  Other Topics Concern  . Not on file  Social History Narrative  . Not on file   No family history on file. Scheduled Meds: . enoxaparin (LOVENOX) injection  40 mg Subcutaneous Q24H  . ipratropium-albuterol  3 mL Nebulization Q6H  . mouth rinse  15 mL Mouth Rinse BID  . metoprolol tartrate  5 mg Intravenous Q8H  . sodium chloride HYPERTONIC  4 mL Nebulization TID   Continuous Infusions: . sodium chloride Stopped (07/21/18 1709)  . dextrose 5 % and 0.45% NaCl 75 mL/hr at 07/22/18 0400  . piperacillin-tazobactam (ZOSYN)  IV Stopped (07/22/18 0931)   PRN Meds:.sodium chloride, acetaminophen, ipratropium-albuterol, ondansetron (ZOFRAN) IV Medications Prior to Admission:  Prior to Admission medications   Medication Sig Start Date End Date Taking? Authorizing Provider  acetaminophen (TYLENOL) 500 MG tablet Take 500 mg by mouth every 4 (four) hours as needed for mild pain, moderate  pain, fever or headache.    Yes [provider]  alum & mag hydroxide-simeth (MINTOX) 200-200-20 MG/5ML suspension Take 30 mLs by mouth as needed for indigestion or heartburn.   Yes [provider]  guaifenesin (ROBITUSSIN) 100 MG/5ML syrup Take 200 mg by mouth every 6 (six) hours as needed for cough.    Yes [provider]  hydrochlorothiazide (HYDRODIURIL) 12.5 MG tablet Take 12.5 mg by mouth daily with breakfast.   Yes [provider]  levalbuterol (XOPENEX) 0.63 MG/3ML nebulizer solution Take 3 mLs (0.63 mg total) by nebulization  every 6 (six) hours as needed for wheezing or shortness of breath. 04/13/16  Yes Regalado, Belkys A, MD  magnesium hydroxide (MILK OF MAGNESIA) 400 MG/5ML suspension Take 30 mLs by mouth at bedtime as needed for mild constipation.    Yes [provider]  neomycin-bacitracin-polymyxin (NEOSPORIN) OINT Apply 1 application topically daily as needed for wound care.   Yes [provider]  potassium chloride 20 MEQ/15ML (10%) SOLN Take 20 mEq by mouth daily.   Yes [provider]  Propylene Glycol-Glycerin (MOISTURE EYES) 1-0.3 % SOLN Place 1 drop into both eyes 3 (three) times daily.   Yes [provider]  Skin Protectants, Misc. (BAZA PROTECT EX) Apply 1 application topically as needed (with every incontinent episode). Apply to groin/buttocks   Yes [provider]  verapamil (CALAN) 80 MG tablet Take 80 mg by mouth 3 (three) times daily.    Yes [provider]  amoxicillin-clavulanate (AUGMENTIN) 875-125 MG tablet Take 1 tablet by mouth every 12 (twelve) hours. Patient not taking: Reported on 07/20/2018 06/02/18   Nira Connardama, Pedro Eduardo, MD  calcium carbonate (OS-CAL - DOSED IN MG OF ELEMENTAL CALCIUM) 1250 (500 CA) MG tablet Take 1 tablet (500 mg of elemental calcium total) by mouth daily with breakfast. Patient not taking: Reported on 07/20/2018 02/09/15   Otis Braceabbani, Marjan, MD  cephALEXin  (KEFLEX) 500 MG capsule Take 1 capsule (500 mg total) by mouth 4 (four) times daily. Patient not taking: Reported on 04/21/2018 04/13/16   Alba Coryegalado, Belkys A, MD   Allergies  Allergen Reactions  . Bee Venom Itching and Swelling    Unknown swelling location    Review of Systems Non verbal  Physical Exam Frail weak lady Resting in bed Has weak wet cough Coarse breath sounds S 1 S 2 Abdomen is soft, non tender No edema Aphasic Tracks me in the room with her eyes  Vital Signs: BP (!) 149/55 (BP Location: Right Arm)   Pulse 63   Temp 98.6 F (37 C) (Oral)   Resp (!) 21   Ht 5\' 2"  (1.575 m)   Wt 53 kg   SpO2 99%   BMI 21.37 kg/m  Pain Scale: PAINAD   Pain Score: 0-No pain   SpO2: SpO2: 99 % O2 Device:SpO2: 99 % O2 Flow Rate: .   IO: Intake/output summary:   Intake/Output Summary (Last 24 hours) at 07/22/2018 1211 Last data filed at 07/22/2018 0400 Gross per 24 hour  Intake 1848.28 ml  Output 1700 ml  Net 148.28 ml    LBM: Last BM Date: (P) 07/21/18 Baseline Weight: Weight: 51.9 kg Most recent weight: Weight: 53 kg     Palliative Assessment/Data:   PPS 20%  Time In:  11 Time Out:  12.10 Time Total:  70 min  Greater than 50%  of this time was spent counseling and coordinating care related to the above assessment and plan.  Signed by: Rosalin HawkingZeba Migel Hannis, MD  [phone] Please contact Palliative Medicine Team phone at [phone] for questions and concerns.  For individual provider: See Loretha StaplerAmion

## 2018-07-23 ENCOUNTER — Inpatient Hospital Stay (HOSPITAL_COMMUNITY): Payer: Medicare Other

## 2018-07-23 DIAGNOSIS — I4891 Unspecified atrial fibrillation: Secondary | ICD-10-CM

## 2018-07-23 LAB — GLUCOSE, CAPILLARY
GLUCOSE-CAPILLARY: 95 mg/dL (ref 70–99)
GLUCOSE-CAPILLARY: 91 mg/dL (ref 70–99)
Glucose-Capillary: 84 mg/dL (ref 70–99)
Glucose-Capillary: 93 mg/dL (ref 70–99)

## 2018-07-23 LAB — RENAL FUNCTION PANEL
Albumin: 2.8 g/dL — ABNORMAL LOW (ref 3.5–5.0)
Anion gap: 6 (ref 5–15)
BUN: 7 mg/dL — ABNORMAL LOW (ref 8–23)
CHLORIDE: 116 mmol/L — AB (ref 98–111)
CO2: 21 mmol/L — ABNORMAL LOW (ref 22–32)
Calcium: 8.4 mg/dL — ABNORMAL LOW (ref 8.9–10.3)
Creatinine, Ser: 0.63 mg/dL (ref 0.44–1.00)
GFR calc non Af Amer: >60 mL/min (ref >60)
Glucose, Bld: 107 mg/dL — ABNORMAL HIGH (ref 70–99)
POTASSIUM: 4.1 mmol/L (ref 3.5–5.1)
Phosphorus: 2.9 mg/dL (ref 2.5–4.6)
Sodium: 143 mmol/L (ref 135–145)

## 2018-07-23 LAB — ECHOCARDIOGRAM COMPLETE
HEIGHTINCHES: 62 in
WEIGHTICAEL: 1901.25 [oz_av]

## 2018-07-23 LAB — CBC
HEMATOCRIT: 38.8 % (ref 36.0–46.0)
HEMOGLOBIN: 12.4 g/dL (ref 12.0–15.0)
MCH: 32.4 pg (ref 26.0–34.0)
MCHC: 32 g/dL (ref 30.0–36.0)
MCV: 101.3 fL — ABNORMAL HIGH (ref 80.0–100.0)
NRBC: 0 % (ref 0.0–0.2)
Platelets: 193 10*3/uL (ref 150–400)
RBC: 3.83 MIL/uL — ABNORMAL LOW (ref 3.87–5.11)
RDW: 13 % (ref 11.5–15.5)
WBC: 11 10*3/uL — AB (ref 4.0–10.5)

## 2018-07-23 LAB — CREATININE, SERUM
Creatinine, Ser: 0.78 mg/dL (ref 0.44–1.00)
GFR calc Af Amer: >60 mL/min (ref >60)
GFR calc non Af Amer: >60 mL/min (ref >60)

## 2018-07-23 LAB — T4, FREE: FREE T4: 0.96 ng/dL (ref 0.82–1.77)

## 2018-07-23 MED ORDER — IPRATROPIUM-ALBUTEROL 0.5-2.5 (3) MG/3ML IN SOLN: 3.0000 mL | Freq: Four times a day (QID) | RESPIRATORY_TRACT | Status: DC | PRN

## 2018-07-23 MED ORDER — ACETAMINOPHEN 650 MG RE SUPP: 325.0000 mg | RECTAL | Status: DC | PRN

## 2018-07-23 NOTE — Progress Notes (Signed)
  Speech Language Pathology Treatment: Dysphagia  Patient Details Name: Brittany Powers MRN: [id number] DOB: 02/28/1931 Today's Date: 07/23/2018 Time: [id number] SLP Time Calculation (min) (ACUTE ONLY): 11 min  Assessment / Plan / Recommendation Clinical Impression  After completion of session, pt's sister in law arrived and asked questions regarding pt's diet, etc.  SLP went into room and assured comfort care was plan for Brittany Powers.  Brittany Bibleat confirmed said information and thus SLP reviewed concept of comfort intake - advising full liquid from floor stock only.  Discussed aspiration causing discomfort and thus comfort po is literally only a few sips/bites for taste/pleasure.   Provided handout regarding comfort feeding transitioning from po for nutritional support to comfort.  Do not advise solids or purees due to increased residual risk and thus increased discomfort with aspiration.    Reviewed further modifications to diet that may be indicated in future.  Advised only to give pt intake if she wants it and it does not cause distress.  Pt is willingly opening her mouth to accept intake.  Several copies of comfort feeding information provided for Brittany Bibleat to share with her other family members.  Thanks.   HPI HPI: 84 yo [facility], concern for aspiration pna with concerns for afib.  PMH + for dementia, aphasia, resident of SNF and on puree diet prior to admit.  Swallow eval ordered.        SLP Plan  Continue with current plan of care       Recommendations  Diet recommendations: Other(comment)(full liquids) Liquids provided via: Teaspoon Medication Administration: Via alternative means Supervision: Staff to assist with self feeding Compensations: Slow rate;Small sips/bites(stop intake if pt coughing) Postural Changes and/or Swallow Maneuvers: Seated upright 90 degrees;Upright 30-60 min after meal                Oral Care Recommendations: Oral care BID SLP Visit Diagnosis:  Dysphagia, oropharyngeal phase (R13.12);Other (comment) Plan: Continue with current plan of care       GO               Brittany Burnetamara Donnalyn Juran, MS [facility] SLP Acute Rehab Services Pager [phone] Office [phone]  Chales AbrahamsKimball, Shanesha Bednarz Ann 07/23/2018, 1:55 PM

## 2018-07-23 NOTE — Progress Notes (Signed)
Pt arrived to the floor with no s/s of distress noted. Pt assessed and chart reviewed, assessment is unchanged from earlier documentation. Will continue to monitor.

## 2018-07-23 NOTE — Progress Notes (Signed)
PMT progress note  Patient seen briefly, overall condition is about the same. Remains with high risk for ongoing decline/decompensation, NPO, SLP recommends honey thick liquids diet recommendations. HCPOA sister Ivor MessierCora not at the bedside today, I will re attempt to discuss with her on 07-24-18.   Would recommend residential hospice and full scope of comfort measures.   BP (!) 194/71 (BP Location: Right Arm)   Pulse 64   Temp 98 F (36.7 C) (Oral)   Resp 15   Ht 5\' 2"  (1.575 m)   Wt 53.9 kg   SpO2 100%   BMI 21.73 kg/m  Labs and imaging noted ECHO was being done when I had arrived on her unit this morning.  She is frail weak aphasic thin lady.  No edema  Continue current mode of care for now. Appreciate CSW initiating hospice arrangements, hopefully the patient's sister Ivor MessierCora can get in touch directly with [facility] liaisons on 07-24-18 for further discussions/additional questions.   15 minutes spent Rosalin HawkingZeba Cipriano Millikan MD [facility] palliative medicine team [phone]  [phone]

## 2018-07-23 NOTE — Progress Notes (Signed)
PROGRESS NOTE   Brittany Powers  BOF:[id number]    DOB: October 04, 1930    DOA: 07/20/2018  PCP: Dione Housekeeper, MD   I have briefly reviewed patients previous medical records in [facility].  Brief Narrative:  84 year old female, SNF resident, history of CVA, nonambulatory/wheelchair bound and aphasic at baseline, dysphagia on pured diet dementia, frequent falls, COPD, HLD, HTN, presented to [facility] ED on 07/20/2018 due to productive cough, fever and vomiting.  She was admitted for sepsis suspected due to aspiration pneumonia from emesis and dysphagia, new onset A. fib with RVR.  Admitted to stepdown unit.  PMT consulted.   Assessment & Plan:   Active Problems:   Sepsis ([place])   Palliative care by specialist   Goals of care, counseling/discussion   Sepsis due to suspected aspiration pneumonia: Likely due to nonbloody emesis PTA complicating chronic dysphagia.  Met sepsis criteria on admission.  CT chest with contrast 11/25: Consolidation lower lobes bilaterally with patchy groundglass consolidation throughout the left upper lobe. She had been empirically started on IV vancomycin and Zosyn.  MRSA PCR negative.  Vancomycin discontinued.  Blood cultures x2: Negative to date.  Urine culture negative.  Sepsis physiology resolved.  Leukocytosis resolved.  RSV panel negative.  Aspiration pneumonia: Management as indicated above, at risk for recurrent aspiration related to advanced dementia and chronic dysphagia.  Dysphagia: Speech therapy input appreciated and recommend honey thickened liquids prn but patient essentially NPO.  Await ST follow-up.  Would not recommend PEG tube.  Nausea and vomiting: Unclear etiology.  CT abdomen 07/20/2018: Reported circumferential narrowing with suggestion of mild inflammation of distal ascending colon and proximal transverse colon and no SBO.  No further nausea or vomiting reported.  Self-limited illness.  Monitor.  New onset A. fib with  RVR: Suspect precipitated by sepsis.  Currently on scheduled IV metoprolol 5 mg every 8 hourly while NPO.  Had another episode of RVR overnight 11/26 which reverted to sinus rhythm.  TSH 0.093 but free T4 0.96.  Check TTE.  Not a candidate for long-term anticoagulation given advanced age, advanced dementia and comorbidities.  When cleared to take orally, consider resuming prior home dose of verapamil.  Dehydration: Limited oral intake due to mental status changes and dysphagia.  For now continue gentle IV fluid hydration.  History of CVA: Nonambulatory/wheelchair bound, aphasia, dysphagia and suspected vascular dementia.  Not on aspirin or statins PTA.  Not anticoagulation candidate.  Essential hypertension: Controlled.  PTA HCTZ and verapamil on hold.  Continue scheduled IV metoprolol.  Hypokalemia: Replaced.  Magnesium 1.7.  Hypophosphatemia: Replaced  Macrocytic anemia: Some of it has dilutional component.  No bleeding reported.  Stable..  Leukocytosis: Secondary to sepsis.  Almost resolved.  Elevated troponin: No chest pain reported but patient aphasic.  May be from demand ischemia related to sepsis, intermittent A. fib with RVR.  Check TTE.  No aggressive evaluation due to advanced age and multiple severe significant irreversible comorbidities.  Adult failure to thrive: PMT input 11/27 appreciated.  DNR and DNI were confirmed with family at bedside, comfort feeds/honey thickened liquids continued but as per RN, essentially NPO, ongoing discussion regarding discharge to residential hospice versus SNF [facility] with hospice.  Await follow-up.  Diarrhea: As per RN, patient had 2 episodes of large-volume diarrhea overnight 11/27.  Low index of suspicion for C. difficile.  Monitor closely.  Not on laxatives.   DVT prophylaxis: Lovenox Code Status: DNR Family Communication: None at bedside. Left VM message  for sister/HCPOA and have not heard back. Disposition: TBD pending further  hospital course and GOC outcome.  Admitted to stepdown unit.  Improved and transferred to telemetry on 11/28   Consultants:  PMT  Procedures:  None  Antimicrobials:  As above   Subjective: Sleeping but arousable.  Tracks activity with eyes.  Chronically nonverbal and aphasic.  As per RN, 2 episodes of diarrhea overnight.  No other active issues noted.  ROS: Unable due to mental status changes.  Objective:  Vitals:   07/23/18 0500 07/23/18 0748 07/23/18 0800 07/23/18 0847  BP:    (!) 190/77  Pulse:    63  Resp:    (!) 22  Temp:   97.9 F (36.6 C)   TempSrc:   Oral   SpO2:  100%  100%  Weight: 53.9 kg     Height:        Examination:  General exam: Elderly female, moderately built, frail, chronically ill looking, lying propped up in bed and tracking activity who around her with her eyes but nonverbal.  Does not follow any instructions.  No change/stable. Respiratory system: Poor inspiratory effort.  Few basal crackles.  Rest of lung fields seem clear to auscultation. Respiratory effort normal.  No change/stable. Cardiovascular system: S1 & S2 heard, RRR. No JVD, murmurs, rubs, gallops or clicks. No pedal edema.  Telemetry personally reviewed: Low voltage, sinus rhythm. Gastrointestinal system: Abdomen is nondistended, soft and nontender. No organomegaly or masses felt. Normal bowel sounds heard.  Stable. Central nervous system: Mental status as above and does not follow instructions. No focal neurological deficits. Extremities: Not seen moving any limbs.?  Contractures of lower extremities. Skin: No rashes, lesions or ulcers Psychiatry: Judgement and insight impaired. Mood & affect flat.     Data Reviewed: I have personally reviewed following labs and imaging studies  CBC: Recent Labs  Lab 07/20/18 1207 07/21/18 0328 07/22/18 0324 07/23/18 0334  WBC 22.5* 32.3* 17.2* 11.0*  NEUTROABS 21.4* 28.3* 14.4*  --   HGB 14.6 12.6 11.8* 12.4  HCT 45.3 38.6 37.0 38.8  MCV  100.4* 99.5 101.9* 101.3*  PLT 245 224 201 749   Basic Metabolic Panel: Recent Labs  Lab 07/20/18 1207 07/21/18 0328 07/22/18 0324 07/23/18 0334  NA 143 142 141 143  K 3.7 3.1* 3.5 4.1  CL 107 111 113* 116*  CO2 26 23 20* 21*  GLUCOSE 149* 156* 143* 107*  BUN _0 7*  CREATININE 0.78 0.85 0.76  0.84 0.63  0.78  CALCIUM 9.3 8.4* 8.4* 8.4*  MG  --  1.7 1.7  --   PHOS  --  2.9 2.1* 2.9   Liver Function Tests: Recent Labs  Lab 07/20/18 1207 07/21/18 0328 07/22/18 0324 07/23/18 0334  AST 24 25  --   --   ALT 16 14  --   --   ALKPHOS 64 49  --   --   BILITOT 1.0 1.1  --   --   PROT 7.5 6.2*  --   --   ALBUMIN 3.9 3.1* 2.7* 2.8*   Cardiac Enzymes: Recent Labs  Lab 07/20/18 1741 07/20/18 2153 07/21/18 0328 07/21/18 0830  TROPONINI 0.08* 0.18* 0.29* 0.25*   CBG: Recent Labs  Lab 07/22/18 1937 07/22/18 2325 07/23/18 0407 07/23/18 0722 07/23/18 1137  GLUCAP 109* 100* 95 91 84    Recent Results (from the past 240 hour(s))  Blood Culture (routine x 2)     Status: None (Preliminary result)  Collection Time: 07/20/18 12:07 PM  Result Value Ref Range Status   Specimen Description   Final    BLOOD BLOOD LEFT WRIST Performed at [facility] [address], [place], [place] [address]    Special Requests   Final    BOTTLES DRAWN AEROBIC AND ANAEROBIC Blood Culture results may not be optimal due to an excessive volume of blood received in culture bottles Performed at [facility] [address] [place], [place] [address]    Culture   Final    NO GROWTH 3 DAYS Performed at [facility], [place] [address], [place], [place] [address]    Report Status PENDING  Incomplete  Urine culture     Status: None   Collection Time: 07/20/18 12:07 PM  Result Value Ref Range Status   Specimen Description   Final    URINE, CATHETERIZED Performed at [facility], [place] [address] [place], [place] [address]     Special Requests   Final    Normal Performed at [facility], [place] [address], [place], [place] [address]    Culture   Final    NO GROWTH Performed at [facility], [place] [address] [place], [place] [address]    Report Status 07/21/2018 FINAL  Final  Blood Culture (routine x 2)     Status: None (Preliminary result)   Collection Time: 07/20/18 12:12 PM  Result Value Ref Range Status   Specimen Description   Final    BLOOD LEFT WRIST Performed at [facility] [address] [place], [place] [address]    Special Requests   Final    BOTTLES DRAWN AEROBIC AND ANAEROBIC Blood Culture results may not be optimal due to an excessive volume of blood received in culture bottles Performed at [facility] [address], [place], [place] [address]    Culture   Final    NO GROWTH 3 DAYS Performed at [facility], [place] [address], [place], [place] [address]    Report Status PENDING  Incomplete  MRSA PCR Screening     Status: None   Collection Time: 07/20/18  5:31 PM  Result Value Ref Range Status   MRSA by PCR NEGATIVE NEGATIVE Final    Comment:        The GeneXpert MRSA Assay (FDA approved for NASAL specimens only), is one component of a comprehensive MRSA colonization surveillance program. It is not intended to diagnose MRSA infection nor to guide or monitor treatment for MRSA infections. Performed at [facility], [place] [address] [place], [place] [address]   Respiratory Panel by PCR     Status: None   Collection Time: 07/20/18  6:10 PM  Result Value Ref Range Status   Adenovirus NOT DETECTED NOT DETECTED Final   Coronavirus 229E NOT DETECTED NOT DETECTED Final   Coronavirus HKU1 NOT DETECTED NOT DETECTED Final   Coronavirus NL63 NOT DETECTED NOT DETECTED Final   Coronavirus OC43 NOT DETECTED NOT DETECTED Final   Metapneumovirus NOT DETECTED NOT DETECTED Final   Rhinovirus / Enterovirus NOT  DETECTED NOT DETECTED Final   Influenza A NOT DETECTED NOT DETECTED Final   Influenza B NOT DETECTED NOT DETECTED Final   Parainfluenza Virus 1 NOT DETECTED NOT DETECTED Final   Parainfluenza Virus 2 NOT DETECTED NOT DETECTED Final   Parainfluenza Virus 3 NOT DETECTED NOT DETECTED Final   Parainfluenza Virus 4 NOT DETECTED NOT  DETECTED Final   Respiratory Syncytial Virus NOT DETECTED NOT DETECTED Final   Bordetella pertussis NOT DETECTED NOT DETECTED Final   Chlamydophila pneumoniae NOT DETECTED NOT DETECTED Final   Mycoplasma pneumoniae NOT DETECTED NOT DETECTED Final    Comment: Performed at [facility], [place] [address], [place], [place] [address]         Radiology Studies: No results found.      Scheduled Meds: . enoxaparin (LOVENOX) injection  40 mg Subcutaneous Q24H  . mouth rinse  15 mL Mouth Rinse BID  . metoprolol tartrate  5 mg Intravenous Q8H   Continuous Infusions: . sodium chloride Stopped (07/22/18 1542)  . 0.9 % NaCl with KCl 40 mEq / L 60 mL/hr (07/23/18 0849)  . piperacillin-tazobactam (ZOSYN)  IV 3.375 g (07/23/18 2780)     LOS: 3 days     Vernell Leep, MD, FACP, [facility]. [facility] Pager [phone]  If 7PM-7AM, please contact night-coverage [email or web address] Password [facility] 07/23/2018, 12:06 PM

## 2018-07-23 NOTE — Progress Notes (Signed)
  Echocardiogram 2D Echocardiogram has been performed.  Brittany Powers 07/23/2018, 11:38 AM

## 2018-07-23 NOTE — Progress Notes (Signed)
Report called to Rn [id number]. Family at bedside. Will transfer via bed to floor.

## 2018-07-23 NOTE — Progress Notes (Signed)
CSW following for discharge needs [facility]. CSW called the on-call number and received call back from StantonJennifer.  In observance of the Holiday, they are not admitting patient's today. HPOG will reach out to CSW tomorrow for further discharge panning.    Vivi BarrackNicole Peace Noyes, Alexander MtLCSW, MSW Clinical Social Worker  07/23/2018  10:06 AM

## 2018-07-23 NOTE — Progress Notes (Signed)
  Speech Language Pathology Treatment:    Patient Details Name: Brittany HaysBettye G Jarquin MRN: [id number] DOB: 12/16/1930 Today's Date: 07/23/2018 Time: [id number] SLP Time Calculation (min) (ACUTE ONLY): 17 min  Assessment / Plan / Recommendation Clinical Impression  Today pt alert - no family present at time of SlP visit.   SLP helped slide pt up in bed and provided her with po intake of nectar thick juice, thin and icecream.  Delayed oral transiting suspected but pt did elicit swallow.  Delayed cough noted after minimal intake = likely aspiration.  Nonverbal communication re: pt lack of desire for more intake noted as pt winced with cough and appeared uncomfortable.    Note plans for comfort care and dc with hospice.  Recommend pt be allowed liquids *floor stock* for comfort.  Pt unable to adequately masticate anything at this point and purees likely to leave significant pharyngeal residuals and create significant discomfort with intake.    Provided sign in room and placed order in Epic.  No family present therefore will follow up x1.    HPI HPI: 84 yo female [facility] with AMS, concern for aspiration pna with concerns for afib.  PMH + for dementia, aphasia, resident of SNF and on puree diet prior to admit.  Swallow eval ordered.        SLP Plan  Continue with current plan of care       Recommendations  Diet recommendations: Other(comment)(full liquids floor stock for comfort only) Medication Administration: Via alternative means Supervision: Staff to assist with self feeding Compensations: Slow rate;Small sips/bites(stop intake if pt coughing) Postural Changes and/or Swallow Maneuvers: Seated upright 90 degrees;Upright 30-60 min after meal                Oral Care Recommendations: Oral care BID SLP Visit Diagnosis: Dysphagia, oropharyngeal phase (R13.12);Other (comment) Plan: Continue with current plan of care       GO                Chales AbrahamsKimball, Gokul Waybright Ann 07/23/2018, 1:49  PM  Donavan Burnetamara Juvon Teater, MS [facility] SLP Acute Rehab Services Pager [phone] Office [phone]

## 2018-07-24 LAB — GLUCOSE, CAPILLARY
GLUCOSE-CAPILLARY: 73 mg/dL (ref 70–99)
Glucose-Capillary: 71 mg/dL (ref 70–99)
Glucose-Capillary: 75 mg/dL (ref 70–99)
Glucose-Capillary: 79 mg/dL (ref 70–99)

## 2018-07-24 LAB — RENAL FUNCTION PANEL
ANION GAP: 10 (ref 5–15)
Albumin: 3.2 g/dL — ABNORMAL LOW (ref 3.5–5.0)
BUN: 8 mg/dL (ref 8–23)
CO2: 20 mmol/L — ABNORMAL LOW (ref 22–32)
Calcium: 8.7 mg/dL — ABNORMAL LOW (ref 8.9–10.3)
Chloride: 111 mmol/L (ref 98–111)
Creatinine, Ser: 0.68 mg/dL (ref 0.44–1.00)
GFR calc Af Amer: >60 mL/min (ref >60)
GFR calc non Af Amer: >60 mL/min (ref >60)
Glucose, Bld: 81 mg/dL (ref 70–99)
POTASSIUM: 4.3 mmol/L (ref 3.5–5.1)
Phosphorus: 2.6 mg/dL (ref 2.5–4.6)
Sodium: 141 mmol/L (ref 135–145)

## 2018-07-24 LAB — T3, FREE: T3, Free: 2.7 pg/mL (ref 2.0–4.4)

## 2018-07-24 MED ORDER — ACETAMINOPHEN 325 MG RE SUPP: 325.0000 mg | RECTAL | Status: AC | PRN

## 2018-07-24 NOTE — Discharge Summary (Signed)
Physician Discharge Summary  Brittany Powers FBP:[id number] DOB: Dec 31, 1930  PCP: Dione Housekeeper, MD  Admit date: 07/20/2018 Discharge date: 07/24/2018  Recommendations for Outpatient Follow-up:  1. MD at [facility] for ongoing hospice care.  Home Health: N/A Equipment/Devices: N/A    Discharge Condition: Guarded with extremely poor prognosis and expected to decline. CODE STATUS: DNR Diet recommendation: Patient is currently NPO. Consider comfort feeds.  Discharge Diagnoses:  Active Problems:   Sepsis ([place])   Palliative care by specialist   Goals of care, counseling/discussion   Brief Summary: 84 year old female, SNF resident, history of CVA, nonambulatory/wheelchair bound and aphasic at baseline, dysphagia on pured diet, dementia, frequent falls, COPD, HLD, HTN, presented to [facility] ED on 07/20/2018 due to productive cough, fever and vomiting.  She was admitted for sepsis suspected due to aspiration pneumonia from emesis and dysphagia, new onset A. fib with RVR.  Admitted to stepdown unit.  PMT consulted.   Assessment & Plan:   Sepsis due to suspected aspiration pneumonia: Likely due to nonbloody emesis PTA complicating chronic dysphagia.  Met sepsis criteria on admission.  CT chest with contrast 11/25: Consolidation lower lobes bilaterally with patchy groundglass consolidation throughout the left upper lobe. She had been empirically started on IV vancomycin and Zosyn.  MRSA PCR negative.  Vancomycin discontinued.  Blood cultures x2: Negative to date.  Urine culture negative.  Sepsis physiology resolved.  Leukocytosis resolved.  RSV panel negative.  Aspiration pneumonia: Management as indicated above, at risk for recurrent aspiration related to advanced dementia and chronic dysphagia.  Dysphagia: Speech therapy input appreciated and recommend honey thickened liquids prn but patient essentially NPO.  Would not recommend PEG tube.  Now transitioned to  hospice and going to [facility].  Nausea and vomiting: Unclear etiology.  CT abdomen 07/20/2018: Reported circumferential narrowing with suggestion of mild inflammation of distal ascending colon and proximal transverse colon and no SBO.  No further nausea or vomiting reported.  Self-limited illness.  Resolved.  New onset A. fib with RVR: Suspect precipitated by sepsis.  Currently on scheduled IV metoprolol 5 mg every 8 hourly while NPO.  Had another episode of RVR overnight 11/26 which reverted to sinus rhythm.  TSH 0.093 but free T4 0.96. TTE: LVEF 60-65%.  Not a candidate for long-term anticoagulation given advanced age, advanced dementia and comorbidities.    Now transitioned to comfort care.  Dehydration: Limited oral intake due to mental status changes and dysphagia.  Discontinued IV fluids at discharge.  History of CVA: Nonambulatory/wheelchair bound, aphasia, dysphagia and suspected vascular dementia.  Not on aspirin or statins PTA.  Not anticoagulation candidate.  Essential hypertension: Controlled.  PTA HCTZ and verapamil on hold.    Hypokalemia: Replaced.  Magnesium 1.7.  Hypophosphatemia: Replaced  Macrocytic anemia: Some of it has dilutional component.  No bleeding reported.  Stable..  Leukocytosis: Secondary to sepsis.  Almost resolved.  Elevated troponin: No chest pain reported but patient aphasic.  May be from demand ischemia related to sepsis, intermittent A. fib with RVR.  No aggressive evaluation due to advanced age and multiple severe significant irreversible comorbidities.  Adult failure to thrive: PMT input 11/27 appreciated.  DNR and DNI were confirmed with family at bedside, comfort feeds/honey thickened liquids continued but as per RN, essentially NPO, ongoing discussion regarding discharge to residential hospice versus SNF [facility] with hospice. PMT and Hospice Liason met with patient's family today and family has sided to transition to full comfort  care and transferred  to [facility].  Diarrhea: As per RN, patient had 2 episodes of large-volume diarrhea overnight 11/27.  Low index of suspicion for C. difficile.  Not on laxatives.    Consultants:  PMT  Procedures:  None   Discharge Instructions  Discharge Instructions    Bed rest   Complete by:  As directed    Call MD for:  difficulty breathing, headache or visual disturbances   Complete by:  As directed    Call MD for:  persistant nausea and vomiting   Complete by:  As directed    Call MD for:  severe uncontrolled pain   Complete by:  As directed    Call MD for:  temperature >100.4   Complete by:  As directed    Discharge instructions   Complete by:  As directed    Patient is currently NPO due to dysphagia and extremely high aspiration risk.  May consider comfort feeds of choice at [facility].       Medication List    STOP taking these medications   acetaminophen 500 MG tablet Commonly known as:  TYLENOL Replaced by:  acetaminophen 325 MG suppository   guaifenesin 100 MG/5ML syrup Commonly known as:  ROBITUSSIN   hydrochlorothiazide 12.5 MG tablet Commonly known as:  HYDRODIURIL   levalbuterol 0.63 MG/3ML nebulizer solution Commonly known as:  XOPENEX   magnesium hydroxide 400 MG/5ML suspension Commonly known as:  MILK OF MAGNESIA   MINTOX 200-200-20 MG/5ML suspension Generic drug:  alum & mag hydroxide-simeth   potassium chloride 20 MEQ/15ML (10%) Soln   verapamil 80 MG tablet Commonly known as:  CALAN     TAKE these medications   acetaminophen 325 MG suppository Commonly known as:  TYLENOL Place 1 suppository (325 mg total) rectally every 4 (four) hours as needed for fever or mild pain. Replaces:  acetaminophen 500 MG tablet   BAZA PROTECT EX Apply 1 application topically as needed (with every incontinent episode). Apply to groin/buttocks   MOISTURE EYES 1-0.3 % Soln Generic drug:  Propylene Glycol-Glycerin Place 1 drop into both  eyes 3 (three) times daily.   neomycin-bacitracin-polymyxin Oint Commonly known as:  NEOSPORIN Apply 1 application topically daily as needed for wound care.      Follow-up Information    MD at [facility] Follow up.   Why:  For ongoing hospice care.         Allergies  Allergen Reactions  . Bee Venom Itching and Swelling    Unknown swelling location       Procedures/Studies: Ct Chest W Contrast  Result Date: 07/20/2018 CLINICAL DATA:  84 year old female with dementia presenting with shaking and fever. Subsequent encounter. EXAM: CT CHEST, ABDOMEN, AND PELVIS WITH CONTRAST TECHNIQUE: Multidetector CT imaging of the chest, abdomen and pelvis was performed following the standard protocol during bolus administration of intravenous contrast. CONTRAST:  18m ISOVUE-300 IOPAMIDOL (ISOVUE-300) INJECTION 61% COMPARISON:  07/20/2018 chest x-ray. FINDINGS: CT CHEST FINDINGS Cardiovascular: Top-normal heart size with prominent coronary artery calcification. Atherosclerotic changes thoracic aorta with slight ectasia the junction the aortic arch and descending aorta. No aneurysm. No pulmonary embolus detected. Mediastinum/Nodes: Heterogeneous thyroid gland with substernal extension without dominant worrisome mass. Slightly prominent subcarinal lymph nodes which short axis dimension of 1.3 cm. Lungs/Pleura: Consolidation lower lobes bilaterally with patchy ground-glass consolidation throughout the left upper lobe. Peribronchial thickening and narrowing. Musculoskeletal: Mild kyphoscoliosis without compression fracture or osseous destructive lesion. Other: Calcified breast implants in place. Dense breast parenchyma with coarse calcifications. CT  ABDOMEN PELVIS FINDINGS Hepatobiliary: Slightly lobulated contour of the liver without secondary findings of cirrhosis. Focal fatty infiltration adjacent to the falciform ligament. Dilated gallbladder without calcified gallstone. No CT evidence to suggest  gallbladder inflammation. Prominent size common bile duct may be related to patient's age. No obstructing calcified common bile duct stone or mass noted. Pancreas: No worrisome pancreatic mass or inflammation. Spleen: No splenic mass or enlargement. Adrenals/Urinary Tract: No obstructing stone or hydronephrosis. Left renal cysts. No adrenal mass. Noncontrast filled views the urinary bladder unremarkable. Stomach/Bowel: Small bowel extends into the inguinal canal bilaterally with greater extension left inguinal canal. Currently this does not appear to be causing obstruction. Circumferential narrowing with suggestion of mild inflammation distal ascending colon and proximal transverse colon. Vascular/Lymphatic: Atherosclerotic changes aorta and aortic branch vessels. Ectatic aorta without focal aneurysm. Narrowing branch vessels including iliac arteries without large vessel occlusion. Scattered normal size lymph nodes. Reproductive: No worrisome abnormality. Other: No free air. Musculoskeletal: Appearance of remote right hip fracture with degenerative changes. Degenerative changes lower lumbar spine. IMPRESSION: 1. Consolidation lower lobes bilaterally with patchy ground-glass consolidation throughout the left upper lobe. Peribronchial thickening and narrowing. Findings suggestive of infection given history of fever and shaking. 2. Subcarinal slightly prominent size lymph nodes probably related to infection. 3. Circumferential narrowing with suggestion of mild inflammation of the distal ascending colon and proximal transverse colon. 4. Small bowel extends into the inguinal canal bilaterally with greater extension into the left inguinal canal. Currently this does not appear to be causing obstruction. 5. Aortic Atherosclerosis (ICD10-I70.0). Diffuse atherosclerotic plaque throughout all visualized arterial structures. Slight ectasia aortic arch/descending thoracic aortic junction and abdominal aorta without focal  aneurysm. Narrowing of aortic branch vessels without large vessel occlusion. Electronically Signed   By: Genia Del M.D.   On: 07/20/2018 17:09   Ct Abdomen Pelvis W Contrast  Result Date: 07/20/2018 CLINICAL DATA:  84 year old female with dementia presenting with shaking and fever. Subsequent encounter. EXAM: CT CHEST, ABDOMEN, AND PELVIS WITH CONTRAST TECHNIQUE: Multidetector CT imaging of the chest, abdomen and pelvis was performed following the standard protocol during bolus administration of intravenous contrast. CONTRAST:  126m ISOVUE-300 IOPAMIDOL (ISOVUE-300) INJECTION 61% COMPARISON:  07/20/2018 chest x-ray. FINDINGS: CT CHEST FINDINGS Cardiovascular: Top-normal heart size with prominent coronary artery calcification. Atherosclerotic changes thoracic aorta with slight ectasia the junction the aortic arch and descending aorta. No aneurysm. No pulmonary embolus detected. Mediastinum/Nodes: Heterogeneous thyroid gland with substernal extension without dominant worrisome mass. Slightly prominent subcarinal lymph nodes which short axis dimension of 1.3 cm. Lungs/Pleura: Consolidation lower lobes bilaterally with patchy ground-glass consolidation throughout the left upper lobe. Peribronchial thickening and narrowing. Musculoskeletal: Mild kyphoscoliosis without compression fracture or osseous destructive lesion. Other: Calcified breast implants in place. Dense breast parenchyma with coarse calcifications. CT ABDOMEN PELVIS FINDINGS Hepatobiliary: Slightly lobulated contour of the liver without secondary findings of cirrhosis. Focal fatty infiltration adjacent to the falciform ligament. Dilated gallbladder without calcified gallstone. No CT evidence to suggest gallbladder inflammation. Prominent size common bile duct may be related to patient's age. No obstructing calcified common bile duct stone or mass noted. Pancreas: No worrisome pancreatic mass or inflammation. Spleen: No splenic mass or enlargement.  Adrenals/Urinary Tract: No obstructing stone or hydronephrosis. Left renal cysts. No adrenal mass. Noncontrast filled views the urinary bladder unremarkable. Stomach/Bowel: Small bowel extends into the inguinal canal bilaterally with greater extension left inguinal canal. Currently this does not appear to be causing obstruction. Circumferential narrowing with suggestion of mild inflammation distal ascending  colon and proximal transverse colon. Vascular/Lymphatic: Atherosclerotic changes aorta and aortic branch vessels. Ectatic aorta without focal aneurysm. Narrowing branch vessels including iliac arteries without large vessel occlusion. Scattered normal size lymph nodes. Reproductive: No worrisome abnormality. Other: No free air. Musculoskeletal: Appearance of remote right hip fracture with degenerative changes. Degenerative changes lower lumbar spine. IMPRESSION: 1. Consolidation lower lobes bilaterally with patchy ground-glass consolidation throughout the left upper lobe. Peribronchial thickening and narrowing. Findings suggestive of infection given history of fever and shaking. 2. Subcarinal slightly prominent size lymph nodes probably related to infection. 3. Circumferential narrowing with suggestion of mild inflammation of the distal ascending colon and proximal transverse colon. 4. Small bowel extends into the inguinal canal bilaterally with greater extension into the left inguinal canal. Currently this does not appear to be causing obstruction. 5. Aortic Atherosclerosis (ICD10-I70.0). Diffuse atherosclerotic plaque throughout all visualized arterial structures. Slight ectasia aortic arch/descending thoracic aortic junction and abdominal aorta without focal aneurysm. Narrowing of aortic branch vessels without large vessel occlusion. Electronically Signed   By: Genia Del M.D.   On: 07/20/2018 17:09   Dg Chest Port 1 View  Result Date: 07/20/2018 CLINICAL DATA:  Fever and vomiting. EXAM: PORTABLE CHEST  1 VIEW COMPARISON:  Two-view chest x-ray 04/12/2016 FINDINGS: The heart size is normal. Aortic atherosclerosis is present. There is no edema or effusion. A moderate-sized hiatal hernia is present. No focal airspace disease is present. Degenerative changes of the thoracic spine are again noted. IMPRESSION: 1. No acute cardiopulmonary disease. 2. Aortic atherosclerosis. 3. Moderate-sized hiatal hernia. Electronically Signed   By: [place] Morelle M.D.   On: 07/20/2018 14:08      Subjective: Nonverbal at baseline.  As per RN, no acute issues noted.  Discharge Exam:  Vitals:   07/24/18 0035 07/24/18 0516 07/24/18 1122 07/24/18 1414  BP: (!) 162/75 (!) 163/74 (!) 172/71 (!) 166/74  Pulse: (!) 58 (!) 59 62 67  Resp: 18 18 (!) 22 (!) 22  Temp: 98.2 F (36.8 C) 97.8 F (36.6 C) 98.6 F (37 C) 98.5 F (36.9 C)  TempSrc: Oral Oral Oral Oral  SpO2: 97% 96% 94% 98%  Weight:  54.4 kg    Height:        General exam: Elderly female, moderately built, frail, chronically ill looking, lying propped up in bed and tracking activity who around her with her eyes but nonverbal.  Does not follow any instructions.  Respiratory system: Poor inspiratory effort.  Few basal crackles.  Rest of lung fields seem clear to auscultation. Respiratory effort normal.   Cardiovascular system: S1 & S2 heard, RRR. No JVD, murmurs, rubs, gallops or clicks. No pedal edema. Gastrointestinal system: Abdomen is nondistended, soft and nontender. No organomegaly or masses felt. Normal bowel sounds heard.  Central nervous system: Mental status as above and does not follow instructions. No focal neurological deficits. Extremities: Not seen moving any limbs.?  Contractures of lower extremities. Skin: No rashes, lesions or ulcers Psychiatry: Judgement and insight impaired. Mood & affect flat.     The results of significant diagnostics from this hospitalization (including imaging, microbiology, ancillary and laboratory) are  listed below for reference.     Microbiology: Recent Results (from the past 240 hour(s))  Blood Culture (routine x 2)     Status: None (Preliminary result)   Collection Time: 07/20/18 12:07 PM  Result Value Ref Range Status   Specimen Description   Final    BLOOD BLOOD LEFT WRIST Performed at [facility],  [place] [address], [place], [place] [address]    Special Requests   Final    BOTTLES DRAWN AEROBIC AND ANAEROBIC Blood Culture results may not be optimal due to an excessive volume of blood received in culture bottles Performed at [place] [address] [place], [place] [address]    Culture   Final    NO GROWTH 4 DAYS Performed at [facility], [place] [address] [place], [place] [address]    Report Status PENDING  Incomplete  Urine culture     Status: None   Collection Time: 07/20/18 12:07 PM  Result Value Ref Range Status   Specimen Description   Final    URINE, CATHETERIZED Performed at [facility], [place] [address], [place], [place] [address]    Special Requests   Final    Normal Performed at [facility], [place] [address], [place], [place] [address]    Culture   Final    NO GROWTH Performed at [facility], [place] [address], [place], [place] [address]    Report Status 07/21/2018 FINAL  Final  Blood Culture (routine x 2)     Status: None (Preliminary result)   Collection Time: 07/20/18 12:12 PM  Result Value Ref Range Status   Specimen Description   Final    BLOOD LEFT WRIST Performed at [place] [address], [place], [place] [address]    Special Requests   Final    BOTTLES DRAWN AEROBIC AND ANAEROBIC Blood Culture results may not be optimal due to an excessive volume of blood received in culture bottles Performed at [place] [address], [place], [place] [address]    Culture   Final    NO GROWTH 4 DAYS Performed at [facility], [place] [address] [place], [place] [address]    Report Status PENDING  Incomplete  MRSA PCR Screening     Status: None   Collection Time: 07/20/18  5:31 PM  Result Value Ref Range Status   MRSA by PCR NEGATIVE NEGATIVE Final    Comment:        The GeneXpert MRSA Assay (FDA approved for NASAL specimens only), is one component of a comprehensive MRSA colonization surveillance program. It is not intended to diagnose MRSA infection nor to guide or monitor treatment for MRSA infections. Performed at [facility], [place] [address], [place], [place] [address]   Respiratory Panel by PCR     Status: None   Collection Time: 07/20/18  6:10 PM  Result Value Ref Range Status   Adenovirus NOT DETECTED NOT DETECTED Final   Coronavirus 229E NOT DETECTED NOT DETECTED Final   Coronavirus HKU1 NOT DETECTED NOT DETECTED Final   Coronavirus NL63 NOT DETECTED NOT DETECTED Final   Coronavirus OC43 NOT DETECTED NOT DETECTED Final   Metapneumovirus NOT DETECTED NOT DETECTED Final   Rhinovirus / Enterovirus NOT DETECTED NOT DETECTED Final   Influenza A NOT DETECTED NOT DETECTED Final   Influenza B NOT DETECTED NOT DETECTED Final   Parainfluenza Virus 1 NOT DETECTED NOT DETECTED Final   Parainfluenza Virus 2 NOT DETECTED NOT DETECTED Final   Parainfluenza Virus 3 NOT DETECTED NOT DETECTED Final   Parainfluenza Virus 4 NOT DETECTED NOT DETECTED Final   Respiratory Syncytial Virus NOT DETECTED NOT DETECTED Final   Bordetella pertussis NOT DETECTED NOT DETECTED Final   Chlamydophila pneumoniae NOT DETECTED NOT DETECTED Final  Mycoplasma pneumoniae NOT DETECTED NOT DETECTED Final    Comment: Performed at [facility], [place] [address] [place], [place] [address]     Labs: CBC: Recent Labs  Lab 07/20/18 1207 07/21/18 0328 07/22/18 0324 07/23/18 0334  WBC 22.5* 32.3* 17.2* 11.0*  NEUTROABS 21.4* 28.3* 14.4*  --   HGB 14.6 12.6 11.8* 12.4  HCT 45.3 38.6 37.0 38.8   MCV 100.4* 99.5 101.9* 101.3*  PLT 245 224 201 680   Basic Metabolic Panel: Recent Labs  Lab 07/20/18 1207 07/21/18 0328 07/22/18 0324 07/23/18 0334 07/24/18 0624  NA 143 142 141 143 141  K 3.7 3.1* 3.5 4.1 4.3  CL 107 111 113* 116* 111  CO2 26 23 20* 21* 20*  GLUCOSE 149* 156* 143* 107* 81  BUN _0 7* 8  CREATININE 0.78 0.85 0.76  0.84 0.63  0.78 0.68  CALCIUM 9.3 8.4* 8.4* 8.4* 8.7*  MG  --  1.7 1.7  --   --   PHOS  --  2.9 2.1* 2.9 2.6   Liver Function Tests: Recent Labs  Lab 07/20/18 1207 07/21/18 0328 07/22/18 0324 07/23/18 0334 07/24/18 0624  AST 24 25  --   --   --   ALT 16 14  --   --   --   ALKPHOS 64 49  --   --   --   BILITOT 1.0 1.1  --   --   --   PROT 7.5 6.2*  --   --   --   ALBUMIN 3.9 3.1* 2.7* 2.8* 3.2*   Cardiac Enzymes: Recent Labs  Lab 07/20/18 1741 07/20/18 2153 07/21/18 0328 07/21/18 0830  TROPONINI 0.08* 0.18* 0.29* 0.25*   CBG: Recent Labs  Lab 07/23/18 2020 07/24/18 0028 07/24/18 0533 07/24/18 0749 07/24/18 1412  GLUCAP 93 79 71 75 73   Thyroid function studies Recent Labs    07/22/18 1524  TSH 0.093*   Urinalysis    Component Value Date/Time   COLORURINE YELLOW 07/20/2018 [place] 07/20/2018 1207   [place] 1.012 07/20/2018 1207   [place] 5.0 07/20/2018 [place] 07/20/2018 [place] 07/20/2018 [place] 07/20/2018 [place] 07/20/2018 1207   [place] 07/20/2018 1207   NITRITE NEGATIVE 07/20/2018 1207   [place] 07/20/2018 1207    I discussed in detail with patient's sister, brother and sister-in-law at bedside this morning, updated care and answered questions.  Time coordinating discharge: 25 minutes  SIGNED:  Vernell Leep, MD, FACP, [facility]. [facility] Pager [phone]  If 7PM-7AM, please contact night-coverage [email or web address] Password [facility] 07/24/2018, 2:27 PM

## 2018-07-24 NOTE — Care Management Important Message (Signed)
Important Message  Patient Details  Name: Brittany HaysBettye G Rosencrans MRN: [id number] Date of Birth: 11/11/1930   Medicare Important Message Given:  Yes    Caren MacadamFuller, Nobuko Gsell 07/24/2018, 10:11 AMImportant Message  Patient Details  Name: Brittany HaysBettye G Powers MRN: [id number] Date of Birth: 12/03/1930   Medicare Important Message Given:  Yes    Caren MacadamFuller, Oakley Orban 07/24/2018, 10:11 AM

## 2018-07-24 NOTE — Progress Notes (Signed)
[facility] if medically stable for discharge- CSW paged MD for DC summary if stable for discharge. Once dc summary is available CSW will arrange transportation.   Stacy GardnerErin Dennard Vezina, LCSW Clinical Social Worker  [facility] [employer]  [phone]

## 2018-07-24 NOTE — Progress Notes (Signed)
[facility] Carley Hammedva and provided [facility]. Carley Hammedva will reach out to the patient sister Alona BeneJoyce today. Social work will continue to follow.

## 2018-07-24 NOTE — Progress Notes (Signed)
PMT no charge note  Discussed with [facility] MD and also discussed with [facility] liaison Forrestine HimEva Davis. Appreciate input.  Would recommend residential hospice and comfort measures. Arrangements underway.  No additional PMT specific recommendations at this time.   Chart reviewed.   Rosalin HawkingZeba Shae Augello MD [facility] [phone]  [phone]

## 2018-07-24 NOTE — Progress Notes (Signed)
Report called to Judeth CornfieldStephanie, Charity fundraiserN at [facility].  Pt discharged via transport services.

## 2018-07-24 NOTE — Progress Notes (Signed)
[facility] for patient today. Paper work completed. Dr. Kern Reaponald Hertweck to assume care per family preference. CSW aware.  RN please call report to [phone].  Please route discharge summary to [phone].  [facility] 4 pm today.   Thank you,  Forrestine Himva Davis, LCSW [phone]

## 2018-07-24 NOTE — Progress Notes (Signed)
PHARMACY NOTE -  Phosphorus  Pharmacy has been assisting with dosing of IV Phos for hypophosphatemia. Patient received dose on 11/27.  Phos level has been WNL for past 2 days.  .    Will sign off at this time.  Please reconsult if a change in clinical status warrants re-evaluation of dosage.  Junita PushMichelle Myya Meenach, PharmD, BCPS 07/24/2018@9 :36 AM

## 2018-07-25 LAB — CULTURE, BLOOD (ROUTINE X 2)
Culture: NO GROWTH
Culture: NO GROWTH

## 2018-08-26 DEATH — deceased
